# Patient Record
Sex: Female | Born: 1950 | Race: White | Hispanic: No | Marital: Married | Smoking: Never smoker
Health system: Southern US, Community
[De-identification: ages and names within clinical notes are randomized; demographics above are authoritative.]

## PROBLEM LIST (undated history)

## (undated) DIAGNOSIS — M1990 Unspecified osteoarthritis, unspecified site: Secondary | ICD-10-CM

## (undated) DIAGNOSIS — K219 Gastro-esophageal reflux disease without esophagitis: Secondary | ICD-10-CM

## (undated) DIAGNOSIS — E785 Hyperlipidemia, unspecified: Secondary | ICD-10-CM

## (undated) DIAGNOSIS — H269 Unspecified cataract: Secondary | ICD-10-CM

## (undated) DIAGNOSIS — E079 Disorder of thyroid, unspecified: Secondary | ICD-10-CM

## (undated) DIAGNOSIS — E039 Hypothyroidism, unspecified: Secondary | ICD-10-CM

## (undated) DIAGNOSIS — T8859XA Other complications of anesthesia, initial encounter: Secondary | ICD-10-CM

## (undated) DIAGNOSIS — N189 Chronic kidney disease, unspecified: Secondary | ICD-10-CM

## (undated) DIAGNOSIS — H409 Unspecified glaucoma: Secondary | ICD-10-CM

## (undated) DIAGNOSIS — T4145XA Adverse effect of unspecified anesthetic, initial encounter: Secondary | ICD-10-CM

## (undated) HISTORY — DX: Hyperlipidemia, unspecified: E78.5

## (undated) HISTORY — PX: OOPHORECTOMY: SHX86

## (undated) HISTORY — DX: Disorder of thyroid, unspecified: E07.9

## (undated) HISTORY — DX: Unspecified cataract: H26.9

## (undated) HISTORY — DX: Unspecified glaucoma: H40.9

---

## 1988-04-30 HISTORY — PX: ABDOMINAL HYSTERECTOMY: SHX81

## 1995-11-27 HISTORY — PX: PUBOVAGINAL SLING: SHX1035

## 1999-11-30 ENCOUNTER — Encounter: Admission: RE | Admit: 1999-11-30 | Discharge: 1999-11-30 | Payer: Self-pay | Admitting: Obstetrics and Gynecology

## 1999-11-30 ENCOUNTER — Encounter: Payer: Self-pay | Admitting: Obstetrics and Gynecology

## 1999-12-07 ENCOUNTER — Encounter: Admission: RE | Admit: 1999-12-07 | Discharge: 1999-12-07 | Payer: Self-pay | Admitting: Obstetrics and Gynecology

## 1999-12-07 ENCOUNTER — Encounter: Payer: Self-pay | Admitting: Obstetrics and Gynecology

## 2000-06-18 HISTORY — PX: CARPAL TUNNEL RELEASE: SHX101

## 2001-04-23 ENCOUNTER — Other Ambulatory Visit: Admission: RE | Admit: 2001-04-23 | Discharge: 2001-04-23 | Payer: Self-pay | Admitting: Obstetrics and Gynecology

## 2001-05-15 ENCOUNTER — Encounter: Admission: RE | Admit: 2001-05-15 | Discharge: 2001-05-15 | Payer: Self-pay | Admitting: Obstetrics and Gynecology

## 2001-05-15 ENCOUNTER — Encounter: Payer: Self-pay | Admitting: Obstetrics and Gynecology

## 2005-05-28 ENCOUNTER — Encounter: Admission: RE | Admit: 2005-05-28 | Discharge: 2005-05-28 | Payer: Self-pay | Admitting: Obstetrics and Gynecology

## 2005-06-22 ENCOUNTER — Ambulatory Visit: Payer: Self-pay | Admitting: Internal Medicine

## 2005-08-03 ENCOUNTER — Ambulatory Visit: Payer: Self-pay | Admitting: Internal Medicine

## 2005-08-29 ENCOUNTER — Ambulatory Visit: Payer: Self-pay | Admitting: Internal Medicine

## 2005-09-12 ENCOUNTER — Ambulatory Visit (HOSPITAL_COMMUNITY): Admission: RE | Admit: 2005-09-12 | Discharge: 2005-09-12 | Payer: Self-pay | Admitting: Internal Medicine

## 2005-09-12 ENCOUNTER — Ambulatory Visit: Payer: Self-pay | Admitting: Internal Medicine

## 2006-02-06 ENCOUNTER — Ambulatory Visit: Payer: Self-pay | Admitting: Internal Medicine

## 2006-08-05 ENCOUNTER — Ambulatory Visit (HOSPITAL_COMMUNITY): Admission: RE | Admit: 2006-08-05 | Discharge: 2006-08-05 | Payer: Self-pay | Admitting: Obstetrics & Gynecology

## 2006-09-13 ENCOUNTER — Ambulatory Visit: Payer: Self-pay | Admitting: Internal Medicine

## 2006-09-19 ENCOUNTER — Ambulatory Visit: Payer: Self-pay | Admitting: Internal Medicine

## 2006-10-14 ENCOUNTER — Ambulatory Visit: Payer: Self-pay | Admitting: Internal Medicine

## 2006-10-14 LAB — CONVERTED CEMR LAB
ALT: 25 units/L (ref 0–40)
AST: 24 units/L (ref 0–37)
Albumin: 3.8 g/dL (ref 3.5–5.2)
BUN: 16 mg/dL (ref 6–23)
Creatinine, Ser: 0.7 mg/dL (ref 0.4–1.2)
GFR calc non Af Amer: 93 mL/min
Glomerular Filtration Rate, Af Am: 112 mL/min/{1.73_m2}
HCT: 41.4 % (ref 36.0–46.0)
MCHC: 34.2 g/dL (ref 30.0–36.0)
Neutrophils Relative %: 57.1 % (ref 43.0–77.0)
Platelets: 348 10*3/uL (ref 150–400)
Potassium: 4.5 meq/L (ref 3.5–5.1)
RBC: 4.66 M/uL (ref 3.87–5.11)
RDW: 12.4 % (ref 11.5–14.6)
Sodium: 138 meq/L (ref 135–145)
Total Bilirubin: 0.6 mg/dL (ref 0.3–1.2)
Total Protein: 7.3 g/dL (ref 6.0–8.3)
WBC: 6 10*3/uL (ref 4.5–10.5)

## 2006-11-06 ENCOUNTER — Ambulatory Visit: Payer: Self-pay | Admitting: Internal Medicine

## 2006-12-10 ENCOUNTER — Ambulatory Visit (HOSPITAL_BASED_OUTPATIENT_CLINIC_OR_DEPARTMENT_OTHER): Admission: RE | Admit: 2006-12-10 | Discharge: 2006-12-10 | Payer: Self-pay | Admitting: Orthopedic Surgery

## 2006-12-10 HISTORY — PX: FINGER SURGERY: SHX640

## 2006-12-16 ENCOUNTER — Ambulatory Visit: Payer: Self-pay | Admitting: Gastroenterology

## 2006-12-30 ENCOUNTER — Ambulatory Visit: Payer: Self-pay | Admitting: Gastroenterology

## 2007-01-14 ENCOUNTER — Ambulatory Visit: Payer: Self-pay | Admitting: Internal Medicine

## 2007-01-14 LAB — CONVERTED CEMR LAB
ALT: 21 units/L (ref 0–40)
AST: 20 units/L (ref 0–37)
LDL Cholesterol: 62 mg/dL (ref 0–99)
Total CHOL/HDL Ratio: 2.6
VLDL: 14 mg/dL (ref 0–40)

## 2007-01-27 ENCOUNTER — Ambulatory Visit: Payer: Self-pay | Admitting: Internal Medicine

## 2007-02-10 ENCOUNTER — Ambulatory Visit: Payer: Self-pay | Admitting: Internal Medicine

## 2007-04-25 ENCOUNTER — Encounter (INDEPENDENT_AMBULATORY_CARE_PROVIDER_SITE_OTHER): Payer: Self-pay | Admitting: *Deleted

## 2007-04-29 ENCOUNTER — Ambulatory Visit: Payer: Self-pay | Admitting: Internal Medicine

## 2007-04-29 DIAGNOSIS — M79609 Pain in unspecified limb: Secondary | ICD-10-CM

## 2007-04-29 DIAGNOSIS — R079 Chest pain, unspecified: Secondary | ICD-10-CM

## 2007-06-09 ENCOUNTER — Encounter: Payer: Self-pay | Admitting: Internal Medicine

## 2007-07-03 ENCOUNTER — Ambulatory Visit: Payer: Self-pay | Admitting: Internal Medicine

## 2007-07-08 LAB — CONVERTED CEMR LAB
ALT: 29 units/L (ref 0–35)
Albumin: 3.8 g/dL (ref 3.5–5.2)
Alkaline Phosphatase: 124 units/L — ABNORMAL HIGH (ref 39–117)
Cholesterol: 113 mg/dL (ref 0–200)
Total Bilirubin: 0.7 mg/dL (ref 0.3–1.2)
Total Protein: 7.1 g/dL (ref 6.0–8.3)

## 2007-07-09 ENCOUNTER — Encounter (INDEPENDENT_AMBULATORY_CARE_PROVIDER_SITE_OTHER): Payer: Self-pay | Admitting: *Deleted

## 2007-08-22 ENCOUNTER — Ambulatory Visit (HOSPITAL_COMMUNITY): Admission: RE | Admit: 2007-08-22 | Discharge: 2007-08-22 | Payer: Self-pay | Admitting: Obstetrics and Gynecology

## 2007-09-26 ENCOUNTER — Encounter: Payer: Self-pay | Admitting: Family Medicine

## 2007-09-26 ENCOUNTER — Ambulatory Visit: Payer: Self-pay | Admitting: Family Medicine

## 2007-09-26 ENCOUNTER — Encounter (INDEPENDENT_AMBULATORY_CARE_PROVIDER_SITE_OTHER): Payer: Self-pay | Admitting: Family Medicine

## 2008-03-24 ENCOUNTER — Telehealth (INDEPENDENT_AMBULATORY_CARE_PROVIDER_SITE_OTHER): Payer: Self-pay | Admitting: *Deleted

## 2008-05-20 ENCOUNTER — Encounter: Payer: Self-pay | Admitting: Internal Medicine

## 2008-06-01 ENCOUNTER — Ambulatory Visit: Payer: Self-pay | Admitting: Internal Medicine

## 2008-08-24 ENCOUNTER — Telehealth (INDEPENDENT_AMBULATORY_CARE_PROVIDER_SITE_OTHER): Payer: Self-pay | Admitting: *Deleted

## 2008-08-27 ENCOUNTER — Ambulatory Visit: Payer: Self-pay | Admitting: Internal Medicine

## 2008-09-12 LAB — CONVERTED CEMR LAB
Albumin: 3.9 g/dL (ref 3.5–5.2)
Alkaline Phosphatase: 116 units/L (ref 39–117)
Bilirubin, Direct: 0.1 mg/dL (ref 0.0–0.3)
Cholesterol: 133 mg/dL (ref 0–200)
LDL Cholesterol: 74 mg/dL (ref 0–99)
Total CHOL/HDL Ratio: 3.4
Total Protein: 7.4 g/dL (ref 6.0–8.3)
VLDL: 20 mg/dL (ref 0–40)

## 2008-09-13 ENCOUNTER — Encounter (INDEPENDENT_AMBULATORY_CARE_PROVIDER_SITE_OTHER): Payer: Self-pay | Admitting: *Deleted

## 2008-10-26 LAB — HM COLONOSCOPY

## 2008-11-25 ENCOUNTER — Ambulatory Visit: Payer: Self-pay | Admitting: Family Medicine

## 2008-12-02 ENCOUNTER — Ambulatory Visit: Payer: Self-pay | Admitting: Internal Medicine

## 2008-12-11 ENCOUNTER — Encounter (INDEPENDENT_AMBULATORY_CARE_PROVIDER_SITE_OTHER): Payer: Self-pay | Admitting: *Deleted

## 2008-12-11 LAB — CONVERTED CEMR LAB
Hgb A1c MFr Bld: 6.1 % — ABNORMAL HIGH (ref 4.6–6.0)
TSH: 4.25 microintl units/mL (ref 0.35–5.50)

## 2008-12-17 ENCOUNTER — Telehealth (INDEPENDENT_AMBULATORY_CARE_PROVIDER_SITE_OTHER): Payer: Self-pay | Admitting: *Deleted

## 2008-12-18 ENCOUNTER — Ambulatory Visit: Payer: Self-pay | Admitting: Family Medicine

## 2009-06-07 ENCOUNTER — Ambulatory Visit: Payer: Self-pay | Admitting: Internal Medicine

## 2009-06-07 DIAGNOSIS — K219 Gastro-esophageal reflux disease without esophagitis: Secondary | ICD-10-CM

## 2009-06-07 DIAGNOSIS — E785 Hyperlipidemia, unspecified: Secondary | ICD-10-CM

## 2009-06-07 DIAGNOSIS — E039 Hypothyroidism, unspecified: Secondary | ICD-10-CM

## 2009-06-08 ENCOUNTER — Encounter: Payer: Self-pay | Admitting: Internal Medicine

## 2009-07-25 ENCOUNTER — Telehealth (INDEPENDENT_AMBULATORY_CARE_PROVIDER_SITE_OTHER): Payer: Self-pay | Admitting: *Deleted

## 2009-09-02 ENCOUNTER — Ambulatory Visit: Payer: Self-pay | Admitting: Internal Medicine

## 2009-09-08 ENCOUNTER — Ambulatory Visit: Payer: Self-pay | Admitting: Internal Medicine

## 2009-09-09 ENCOUNTER — Telehealth (INDEPENDENT_AMBULATORY_CARE_PROVIDER_SITE_OTHER): Payer: Self-pay | Admitting: *Deleted

## 2009-09-09 ENCOUNTER — Encounter (INDEPENDENT_AMBULATORY_CARE_PROVIDER_SITE_OTHER): Payer: Self-pay | Admitting: *Deleted

## 2009-09-09 LAB — CONVERTED CEMR LAB
ALT: 30 units/L (ref 0–35)
AST: 27 units/L (ref 0–37)
Bilirubin, Direct: 0 mg/dL (ref 0.0–0.3)
Cholesterol: 196 mg/dL (ref 0–200)
HDL: 36.9 mg/dL — ABNORMAL LOW (ref >39.00)
Hgb A1c MFr Bld: 6.1 % (ref 4.6–6.5)
Total Bilirubin: 1 mg/dL (ref 0.3–1.2)
Total Protein: 7.5 g/dL (ref 6.0–8.3)
VLDL: 22.6 mg/dL (ref 0.0–40.0)

## 2009-09-15 ENCOUNTER — Telehealth (INDEPENDENT_AMBULATORY_CARE_PROVIDER_SITE_OTHER): Payer: Self-pay | Admitting: *Deleted

## 2009-09-15 ENCOUNTER — Ambulatory Visit: Payer: Self-pay | Admitting: Family Medicine

## 2009-09-15 ENCOUNTER — Encounter: Payer: Self-pay | Admitting: Internal Medicine

## 2009-09-20 ENCOUNTER — Encounter (INDEPENDENT_AMBULATORY_CARE_PROVIDER_SITE_OTHER): Payer: Self-pay | Admitting: *Deleted

## 2009-09-20 DIAGNOSIS — J45909 Unspecified asthma, uncomplicated: Secondary | ICD-10-CM

## 2009-09-27 ENCOUNTER — Ambulatory Visit: Payer: Self-pay | Admitting: Internal Medicine

## 2009-10-14 ENCOUNTER — Encounter: Admission: RE | Admit: 2009-10-14 | Discharge: 2009-10-14 | Payer: Self-pay | Admitting: Obstetrics and Gynecology

## 2009-11-16 ENCOUNTER — Telehealth (INDEPENDENT_AMBULATORY_CARE_PROVIDER_SITE_OTHER): Payer: Self-pay | Admitting: *Deleted

## 2009-11-18 ENCOUNTER — Encounter: Payer: Self-pay | Admitting: Internal Medicine

## 2010-01-02 ENCOUNTER — Ambulatory Visit: Payer: Self-pay | Admitting: Internal Medicine

## 2010-01-02 DIAGNOSIS — M26629 Arthralgia of temporomandibular joint, unspecified side: Secondary | ICD-10-CM

## 2010-01-02 LAB — CONVERTED CEMR LAB: Rapid Strep: NEGATIVE

## 2010-01-17 ENCOUNTER — Telehealth: Payer: Self-pay | Admitting: Internal Medicine

## 2010-03-30 ENCOUNTER — Encounter (INDEPENDENT_AMBULATORY_CARE_PROVIDER_SITE_OTHER): Payer: Self-pay | Admitting: *Deleted

## 2010-04-28 ENCOUNTER — Telehealth (INDEPENDENT_AMBULATORY_CARE_PROVIDER_SITE_OTHER): Payer: Self-pay | Admitting: *Deleted

## 2010-05-01 ENCOUNTER — Ambulatory Visit: Payer: Self-pay | Admitting: Internal Medicine

## 2010-05-01 ENCOUNTER — Telehealth (INDEPENDENT_AMBULATORY_CARE_PROVIDER_SITE_OTHER): Payer: Self-pay | Admitting: *Deleted

## 2010-05-09 LAB — CONVERTED CEMR LAB
ALT: 26 units/L (ref 0–35)
AST: 27 units/L (ref 0–37)
Albumin: 4.1 g/dL (ref 3.5–5.2)
Chloride: 105 meq/L (ref 96–112)
Eosinophils Relative: 1.9 % (ref 0.0–5.0)
GFR calc non Af Amer: 97.77 mL/min (ref >60)
Glucose, Bld: 89 mg/dL (ref 70–99)
HDL: 42.4 mg/dL (ref >39.00)
Lymphocytes Relative: 30.1 % (ref 12.0–46.0)
MCV: 89.2 fL (ref 78.0–100.0)
Monocytes Absolute: 0.7 10*3/uL (ref 0.1–1.0)
Monocytes Relative: 10.4 % (ref 3.0–12.0)
Neutrophils Relative %: 57 % (ref 43.0–77.0)
Platelets: 293 10*3/uL (ref 150.0–400.0)
Potassium: 4.4 meq/L (ref 3.5–5.1)
RBC: 4.49 M/uL (ref 3.87–5.11)
Sodium: 142 meq/L (ref 135–145)
Total Protein: 7.1 g/dL (ref 6.0–8.3)
Triglycerides: 71 mg/dL (ref 0.0–149.0)
VLDL: 14.2 mg/dL (ref 0.0–40.0)
WBC: 6.3 10*3/uL (ref 4.5–10.5)

## 2010-05-12 ENCOUNTER — Telehealth (INDEPENDENT_AMBULATORY_CARE_PROVIDER_SITE_OTHER): Payer: Self-pay | Admitting: *Deleted

## 2010-05-30 ENCOUNTER — Ambulatory Visit: Payer: Self-pay | Admitting: Internal Medicine

## 2010-05-30 DIAGNOSIS — IMO0001 Reserved for inherently not codable concepts without codable children: Secondary | ICD-10-CM | POA: Insufficient documentation

## 2010-05-30 DIAGNOSIS — E559 Vitamin D deficiency, unspecified: Secondary | ICD-10-CM | POA: Insufficient documentation

## 2010-05-31 ENCOUNTER — Encounter: Payer: Self-pay | Admitting: Internal Medicine

## 2010-05-31 LAB — CONVERTED CEMR LAB
Magnesium: 2.5 mg/dL (ref 1.5–2.5)
Vit D, 25-Hydroxy: 41 ng/mL (ref 30–89)

## 2010-06-27 ENCOUNTER — Telehealth: Payer: Self-pay | Admitting: Internal Medicine

## 2010-08-23 ENCOUNTER — Ambulatory Visit: Payer: Self-pay | Admitting: Internal Medicine

## 2010-11-30 ENCOUNTER — Telehealth: Payer: Self-pay | Admitting: Internal Medicine

## 2010-12-06 ENCOUNTER — Other Ambulatory Visit: Payer: Self-pay | Admitting: Internal Medicine

## 2010-12-06 ENCOUNTER — Ambulatory Visit
Admission: RE | Admit: 2010-12-06 | Discharge: 2010-12-06 | Payer: Self-pay | Source: Home / Self Care | Attending: Internal Medicine | Admitting: Internal Medicine

## 2010-12-06 LAB — HEPATIC FUNCTION PANEL
ALT: 31 U/L (ref 0–35)
AST: 28 U/L (ref 0–37)
Albumin: 3.7 g/dL (ref 3.5–5.2)
Alkaline Phosphatase: 139 U/L — ABNORMAL HIGH (ref 39–117)
Bilirubin, Direct: 0.1 mg/dL (ref 0.0–0.3)
Total Bilirubin: 0.5 mg/dL (ref 0.3–1.2)
Total Protein: 7 g/dL (ref 6.0–8.3)

## 2010-12-06 LAB — LIPID PANEL
Cholesterol: 131 mg/dL (ref 0–200)
HDL: 39.8 mg/dL (ref >39.00)
LDL Cholesterol: 73 mg/dL (ref 0–99)
Total CHOL/HDL Ratio: 3
Triglycerides: 91 mg/dL (ref 0.0–149.0)
VLDL: 18.2 mg/dL (ref 0.0–40.0)

## 2010-12-06 LAB — TSH: TSH: 5.78 u[IU]/mL — ABNORMAL HIGH (ref 0.35–5.50)

## 2010-12-18 ENCOUNTER — Telehealth (INDEPENDENT_AMBULATORY_CARE_PROVIDER_SITE_OTHER): Payer: Self-pay | Admitting: *Deleted

## 2010-12-26 ENCOUNTER — Telehealth (INDEPENDENT_AMBULATORY_CARE_PROVIDER_SITE_OTHER): Payer: Self-pay | Admitting: *Deleted

## 2010-12-27 ENCOUNTER — Encounter (INDEPENDENT_AMBULATORY_CARE_PROVIDER_SITE_OTHER): Payer: Self-pay | Admitting: *Deleted

## 2010-12-28 NOTE — Progress Notes (Signed)
Summary: Refill Request  Phone Note Refill Request Message from:  Patient on May 01, 2010 10:42 AM  Refills Requested: Medication #1:  VYTORIN 10-40 MG TABS 1 by mouth [facility] bedtime [facility] [address] Nothing has reached the pharmacy, please send now! It has been over a week.   Initial call taken by: Harold Barban,  May 01, 2010 10:43 AM  Follow-up for Phone Call        Called RX in to the pharmacy Follow-up by: Shonna Chock,  May 01, 2010 11:21 AM

## 2010-12-28 NOTE — Progress Notes (Signed)
Summary: Triage: Exposed to Scabies  Phone Note Call from Patient Call back at [phone]   Summary of Call: Message left on Triage VM: Patient was exposed to scabies, ? what to do or what to look for   Dr.Hopper please advise./Chrae [facility] CMA  June 27, 2010 4:19 PM   Follow-up for Phone Call        watch for itching or  linear reddish burrows on extremities, in armpits or groin areas Follow-up by: Marga Melnick MD,  June 27, 2010 5:46 PM  Additional Follow-up for Phone Call Additional follow up Details #1::        Patient notified. Additional Follow-up by: Lucious Groves CMA,  June 28, 2010 1:58 PM

## 2010-12-28 NOTE — Progress Notes (Signed)
Summary: Medication Concerns  Phone Note Refill Request   [facility] to know "which one is [facility]" levothyroid 25 micrograms 1 tab by mouth every day  or levoxyl 25 micrograms 1 tab every day -- [facility] [phone]  Initial call taken by: Okey Regal [place],  May 12, 2010 9:08 AM  Follow-up for Phone Call        Left message on machine for patient to return call when avaliable, Reason for call:   discuss thyroid med Follow-up by: Shonna Chock,  May 12, 2010 9:48 AM  Additional Follow-up for Phone Call Additional follow up Details #1::        Spoke with patient, patient states her med bottle say's Levoxyl. I changed on med list and forwarded back to the pharmacy Additional Follow-up by: Shonna Chock,  May 12, 2010 3:48 PM    New/Updated Medications: LEVOXYL 25 MCG TABS (LEVOTHYROXINE SODIUM) 1 by mouth once daily Prescriptions: LEVOXYL 25 MCG TABS (LEVOTHYROXINE SODIUM) 1 by mouth once daily  #30 x 11   Entered by:   Shonna Chock   Authorized by:   Marga Melnick MD   Signed by:   Shonna Chock on 05/12/2010   Method used:   [facility]        [facility]  [address] [id number]       [address]       [place], [place]  [address]       Ph: [phone] or [phone]       Fax: [phone]   RxID:   [id number]

## 2010-12-28 NOTE — Progress Notes (Signed)
Summary: still no better  Phone Note Call from Patient Call back at Home Phone [phone]   Caller: Patient Summary of Call: pt left VM that she was seen on 01-02-09 and rx a z-pak for bronchitis. pt still c/o runny nose, and cough with yellowish wihite mucous. Pt denies any sore throat, fever, or chest congestion. [facility] [address] pls advise...................Marland KitchenFelecia Deloach CMA  January 17, 2010 11:47 AM   Follow-up for Phone Call        Metronidazole 500 mg three times a day #21(no alcohol on this) Follow-up by: Marga Melnick MD,  January 17, 2010 12:11 PM  Additional Follow-up for Phone Call Additional follow up Details #1::        pt aware rx sent to pharmacy..............Marland KitchenFelecia Deloach CMA  January 17, 2010 12:24 PM     New/Updated Medications: FLAGYL 500 MG TABS (METRONIDAZOLE) Take 1 three times a day (no alcohol on this) Prescriptions: FLAGYL 500 MG TABS (METRONIDAZOLE) Take 1 three times a day (no alcohol on this)  #21 x 0   Entered by:   Jeremy Johann CMA   Authorized by:   Marga Melnick MD   Signed by:   Jeremy Johann CMA on 01/17/2010   Method used:   [facility] to ...       [facility]  [address] [id number]       [address]       [place], [place]  [address]       Ph: [phone] or [phone]       Fax: [phone]   RxID:   [id number]

## 2010-12-28 NOTE — Progress Notes (Signed)
Summary: med not available  Phone Note From Pharmacy   Summary of Call: FLAGYL 500 MG TABS  is not available pls advise on alternate med............Marland KitchenFelecia Deloach CMA  January 17, 2010 2:36 PM   Follow-up for Phone Call        per dr hopper DOXYCYCLINE  100 MG Take 1 by mouth two times a day x2days then 1 tab once daily  #12.  pt aware rx sent to pharmacy............Marland KitchenFelecia Deloach CMA  January 17, 2010 2:44 PM     New/Updated Medications: DOXYCYCLINE HYCLATE 100 MG TABS (DOXYCYCLINE HYCLATE) Take 1 by mouth two times a day x2days then 1 tab once daily Prescriptions: DOXYCYCLINE HYCLATE 100 MG TABS (DOXYCYCLINE HYCLATE) Take 1 by mouth two times a day x2days then 1 tab once daily  #12 x 0   Entered by:   Jeremy Johann CMA   Authorized by:   Marga Melnick MD   Signed by:   Jeremy Johann CMA on 01/17/2010   Method used:   [facility]ph[facility] to ...       [facility]  [address] [id number]       [address]       [place], [place]  [address]       Ph: [phone] or [phone]       Fax: [phone]   RxID:   [id number]

## 2010-12-28 NOTE — Progress Notes (Signed)
Summary: Lab Work  Phone Note Call from Patient   Caller: Patient Summary of Call: Patient called and said it was time for blood work. A letter was also mailed to her reminding her it was time for blood work. What labs does she need and codes? Initial call taken by: Harold Barban,  April 28, 2010 9:55 AM  Follow-up for Phone Call        If patient is going to schedule a CPX we can do all labs(Lipid/Hep/CBCD/BMP/TSH V70.0/995.20/272.4) if not we can draw Lipid/Hep 272.4/995.20 Follow-up by: Shonna Chock,  April 28, 2010 10:40 AM

## 2010-12-28 NOTE — Assessment & Plan Note (Signed)
Summary: SINUS PRESSURE,FACIAL PAIN/RH......   Vital Signs:  Patient profile:   60 year old female Weight:      212.8 pounds BMI:     37.24 Temp:     98.7 degrees F oral Pulse rate:   84 / minute Resp:     15 per minute BP sitting:   130 / 84  (left arm) Cuff size:   large  Vitals Entered By: Shonna Chock CMA (August 23, 2010 12:09 PM) CC: Sinus concerns since yesterday, URI symptoms   CC:  Sinus concerns since yesterday and URI symptoms.  History of Present Illness: RTI Symptoms      This is a 60 year old woman who presents with RTI symptoms > 24 hrs.Trigger was smoke exposure 09/26.  The patient reports nasal congestion and productive cough with green sputum, but denies purulent nasal discharge, sore throat,  and earache.  The patient denies fever.  The patient also reports frontal headache.  Risk factors for Strep sinusitis include bilateral facial pain and tooth pain.  The patient denies the following risk factors for Strep sinusitis: tender adenopathy.  Rx: Neti pot, Dayquil &  Nyquil  Current Medications (verified): 1)  Premarin 0.425 Mg/gm Crea (Estrogens, Conjugated) .Marland Kitchen.. 1 By Mouth Once Daily 2)  Advair Diskus 250-50 Mcg/dose Misc (Fluticasone-Salmeterol) .Marland Kitchen.. 1puff Bid 3)  Levoxyl 25 Mcg Tabs (Levothyroxine Sodium) .Marland Kitchen.. 1 By Mouth Once Daily 4)  Nexium 40 Mg Cpdr (Esomeprazole Magnesium) .Marland Kitchen.. 1 Two Times A Day X 2 Weeks Then 1 Once Daily 5)  Ventolin Hfa 108 (90 Base) Mcg/act Aers (Albuterol Sulfate) .... 2 Puffs By Mouth Every 4 Hours As Needed For Asthma 6)  Ergocalciferol 50000 Unit Caps (Ergocalciferol) .... Take 1 Tablet Once A Week. 7)  Zinc 50 Mg Tabs (Zinc) .... Take 1 Tablet By Mouth Once A Day 8)  Simvastatin 40 Mg Tabs (Simvastatin) .Marland Kitchen.. 1 At Bedtime 9)  Calcium-Magnesium 500-250 Mg Tabs (Calcium-Magnesium) .Marland Kitchen.. 1 By Mouth Once Daily  Allergies: 1)  ! Cipro 2)  ! Pcn 3)  ! Ceftin 4)  ! Sulfa 5)  ! * Tobramycin 6)  ! * Ciloxan  Physical  Exam  General:  in no acute distress; alert,appropriate and cooperative throughout examination Ears:  External ear exam shows no significant lesions or deformities.  Otoscopic examination reveals clear canals, tympanic membranes are intact bilaterally without bulging, retraction, inflammation or discharge. Hearing is grossly normal bilaterally. Nose:  External nasal examination shows no deformity or inflammation. Nasal mucosa are  dry  without lesions or exudates. Mouth:  Oral mucosa and oropharynx without lesions or exudates.  Teeth in good repair. No pharyngeal erythema.   Lungs:  Normal respiratory effort, chest expands symmetrically. Lungs are clear to auscultation, no crackles or wheezes but dry cough. Heart:  Normal rate and regular rhythm. S1 and S2 normal without gallop, murmur, click, rub .S 4 Cervical Nodes:  No lymphadenopathy noted Axillary Nodes:  No palpable lymphadenopathy   Impression & Recommendations:  Problem # 1:  URI (ICD-465.9)  Her updated medication list for this problem includes:    Hydromet 5-1.5 Mg/64ml Syrp (Hydrocodone-homatropine) .Marland Kitchen... 1 tsp every 6 hrs as needed  Problem # 2:  BRONCHITIS-ACUTE (ICD-466.0)  The following medications were removed from the medication list:    Doxycycline Hyclate 100 Mg Tabs (Doxycycline hyclate) .Marland Kitchen... Take 1 by mouth two times a day x2days then 1 tab once daily Her updated medication list for this problem includes:    Advair Diskus 250-50  Mcg/dose Misc (Fluticasone-salmeterol) .Marland Kitchen... 1puff bid    Ventolin Hfa 108 (90 Base) Mcg/act Aers (Albuterol sulfate) .Marland Kitchen... 2 puffs by mouth every 4 hours as needed for asthma    Hydromet 5-1.5 Mg/73ml Syrp (Hydrocodone-homatropine) .Marland Kitchen... 1 tsp every 6 hrs as needed    Azithromycin 250 Mg Tabs (Azithromycin) .Marland Kitchen... As per pack  Complete Medication List: 1)  Premarin 0.425 Mg/gm Crea (estrogens, Conjugated)  .Marland Kitchen.. 1 by mouth once daily 2)  Advair Diskus 250-50 Mcg/dose Misc  (Fluticasone-salmeterol) .Marland Kitchen.. 1puff bid 3)  Levoxyl 25 Mcg Tabs (Levothyroxine sodium) .Marland Kitchen.. 1 by mouth once daily 4)  Nexium 40 Mg Cpdr (Esomeprazole magnesium) .Marland Kitchen.. 1 two times a day x 2 weeks then 1 once daily 5)  Ventolin Hfa 108 (90 Base) Mcg/act Aers (Albuterol sulfate) .... 2 puffs by mouth every 4 hours as needed for asthma 6)  Ergocalciferol 50000 Unit Caps (Ergocalciferol) .... Take 1 tablet once a week. 7)  Zinc 50 Mg Tabs (Zinc) .... Take 1 tablet by mouth once a day 8)  Simvastatin 40 Mg Tabs (Simvastatin) .Marland Kitchen.. 1 at bedtime 9)  Calcium-magnesium 500-250 Mg Tabs (Calcium-magnesium) .Marland Kitchen.. 1 by mouth once daily 10)  Hydromet 5-1.5 Mg/31ml Syrp (Hydrocodone-homatropine) .Marland Kitchen.. 1 tsp every 6 hrs as needed 11)  Azithromycin 250 Mg Tabs (Azithromycin) .... As per pack  Patient Instructions: 1)  Drink as much NON dairy  fluid as you can tolerate for the next few days. Prescriptions: AZITHROMYCIN 250 MG TABS (AZITHROMYCIN) as per pack  #1 x 0   Entered and Authorized by:   Marga Melnick MD   Signed by:   Marga Melnick MD on 08/23/2010   Method used:   Printed then [facility] to ...       [facility]  [address] [id number]       [address]       [place], [place]  [address]       Ph: [phone] or [phone]       Fax: [phone]   RxID:   [id number] HYDROMET 5-1.5 MG/5ML SYRP (HYDROCODONE-HOMATROPINE) 1 tsp every 6 hrs as needed  #90cc x 0   Entered and Authorized by:   Marga Melnick MD   Signed by:   Marga Melnick MD on 08/23/2010   Method used:   Printed then faxed to ...       [facility]  [address] [id number]       [address]       [place], [place]  [address]       Ph: [phone] or [phone]       Fax: [phone]   RxID:   [id number]

## 2010-12-28 NOTE — Assessment & Plan Note (Signed)
Summary: SINUS INFECTION,STREP THROAT/KDC   Vital Signs:  Patient profile:   60 year old female Weight:      213 pounds O2 Sat:      97 % on Room air Temp:     98.5 degrees F oral Pulse rate:   87 / minute Resp:     14 per minute BP sitting:   142 / 90  (left arm)  Vitals Entered By: Jeremy Johann CMA (January 02, 2010 12:52 PM)  O2 Flow:  Room air CC: cough, sore throat, green mucous Comments REVIEWED MED LIST, PATIENT AGREED DOSE AND INSTRUCTION CORRECT    CC:  cough, sore throat, and green mucous.  History of Present Illness: Onset as R frontall headache as of 12/28/2009. Dry , irritated throat as of 02/06. R> L earache w/o discharge. Rx: Mucinex DM  Allergies: 1)  ! Cipro 2)  ! Pcn 3)  ! Ceftin 4)  ! Sulfa 5)  ! * Tobramycin 6)  ! * Ciloxan  Review of Systems General:  Denies chills, fever, and sweats. ENT:  Denies nasal congestion and sinus pressure; No facial pain ; scant nasal D/C. Pain L ear since 09/2009; chronic pain @ Dental appts. Resp:  Complains of cough and sputum productive; denies shortness of breath and wheezing; Scant yellow sputum, > from chest.  Physical Exam  General:  well-nourished,in no acute distress; alert,appropriate and cooperative throughout examination Ears:  External ear exam shows no significant lesions or deformities.  Otoscopic examination reveals clear canals, tympanic membranes are intact bilaterally without bulging, retraction, inflammation or discharge. Hearing is grossly normal bilaterally. Nose:  External nasal examination shows no deformity or inflammation. Nasal mucosa are pink and moist without lesions or exudates. Mouth:  Oral mucosa and oropharynx without lesions or exudates.  Teeth in good repair. Classic L TMJ pain with mouth opening Lungs:  Normal respiratory effort, chest expands symmetrically. Lungs are clear to auscultation, no crackles or wheezes. Cervical Nodes:  No lymphadenopathy noted Axillary Nodes:  No  palpable lymphadenopathy   Impression & Recommendations:  Problem # 1:  BRONCHITIS-ACUTE (ICD-466.0)  The following medications were removed from the medication list:    Clarithromycin 500 Mg Xr24h-tab (Clarithromycin) .Marland Kitchen... 2 once daily with a meal ; stop vytorin until completed Her updated medication list for this problem includes:    Advair Diskus 250-50 Mcg/dose Misc (Fluticasone-salmeterol) .Marland Kitchen... 1puff bid    Azithromycin 250 Mg Tabs (Azithromycin) .Marland Kitchen... 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 1 by mouth once daily 2)  Advair Diskus 250-50 Mcg/dose Misc (Fluticasone-salmeterol) .Marland Kitchen.. 1puff bid 3)  Levothroid 25 Mcg Tabs (Levothyroxine sodium) .Marland Kitchen.. 1 by mouth qd 4)  Vytorin 10-40 Mg Tabs (Ezetimibe-simvastatin) .Marland Kitchen.. 1 by mouth at bedtime 5)  Nexium 40 Mg Cpdr (Esomeprazole magnesium) .Marland Kitchen.. 1 two times a day x 2 weeks then 1 once daily 6)  Azithromycin 250 Mg Tabs (Azithromycin) .... As per pack  Patient Instructions: 1)  Warm compresses or heating pad two times a day prmn for TMJ. Go to WebMD for TMJ . Glucosamine 1500 mg once daily , 3 months on ,then 2 months off. 2)  Drink as much fluid as you can tolerate for the next few days. Neti pot once daily for head congestion Prescriptions: AZITHROMYCIN 250 MG TABS (AZITHROMYCIN) as per pack  #1 x 0   Entered  and Authorized by:   Marga Melnick MD   Signed by:   Marga Melnick MD on 01/02/2010   Method used:   [facility]ed to ...       [facility]  [address] [id number]       [address]       [place], [place]  [address]       Ph: [phone] or [phone]       Fax: [phone]   RxID:   [id number]   Laboratory Results   Date/Time Reported: January 02, 2010 1:11 PM   Other Tests  Rapid Strep: negative

## 2010-12-28 NOTE — Letter (Signed)
Summary: Primary Care Appointment Letter  [facility]  [address] [place], [place] [address]   Phone: [phone]  Fax: [phone]    03/30/2010 MRN: [id number]  [facility] [address] [place], [place]  [address]  Dear [facility],   Your Primary Care Physician Marga Melnick MD has indicated that:    _______it is time to schedule an appointment.    _______you missed your appointment on______ and need to call and          reschedule.    ___X____you need to have lab work done.    _______you need to schedule an appointment discuss lab or test results.    _______you need to call to reschedule your appointment that is                       scheduled on _________.     Please call our office as soon as possible. Our phone number is [phone]. Please press option 1. Our office is open 8a-12noon and 1p-5p, Monday through Friday.     Thank you,    [facility]

## 2010-12-28 NOTE — Assessment & Plan Note (Signed)
Summary: F/U on labs/scm--Rm 5   Vital Signs:  Patient profile:   60 year old female Height:      63.5 inches Weight:      215.50 pounds BMI:     37.71 Temp:     98.0 degrees F oral Pulse rate:   78 / minute Pulse rhythm:   regular Resp:     16 per minute BP sitting:   130 / 90  (left arm) Cuff size:   large  Vitals Entered By: Mervin Kung CMA Duncan Dull) (May 30, 2010 9:11 AM) CC: Room 5  Follow up of labs.  Pt states her hair loss has seemed to stop after starting the Vitamin D. Needs refills on Levoxyl and Vytorin. Is Patient Diabetic? No Comments Pt has completed Doxycycline.   CC:  Room 5  Follow up of labs.  Pt states her hair loss has seemed to stop after starting the Vitamin D. Needs refills on Levoxyl and Vytorin.Marland Kitchen  History of Present Illness:  Hyperlipidemia Follow-Up      This is a 60 year old woman who presents for Hyperlipidemia follow-up.  The patient reports muscle aches intermittently , occasional constipation, and "diarrhea"  as loose stool in am, but denies GI upset, abdominal pain, flushing, itching, and fatigue.  Other symptoms include pedal edema in LLE, especially across dorsum of foot.  The patient denies the following symptoms: exertional chest pain/pressure, exercise intolerance, dypsnea, palpitations, and syncope.  Compliance with medications (by patient report) has been near 100%.  Dietary compliance has been good.  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  Labs reviewed & risks discussed; copy given to patient.  Allergies: 1)  ! Cipro 2)  ! Pcn 3)  ! Ceftin 4)  ! Sulfa 5)  ! * Tobramycin 6)  ! * Ciloxan  Review of Systems General:  Complains of fatigue; denies weight loss. Eyes:  Complains of blurring; denies double vision and vision loss-both eyes. ENT:  Denies difficulty swallowing and hoarseness. CV:  Denies  palpitations. GI:  Denies bloody stools and dark tarry stools; ERD controlled with PPI. MS:  Complains of muscle aches and cramps; Leg cramps every night & positionally during day. On vit D 50,000 International Units weekly from Dr Genice Rouge. Derm:  Complains of changes in nail beds, dryness, and hair loss; Hair loss improved with increased vitamin D from Gyn. Neuro:  Denies numbness, tingling, and weakness. Endo:  Denies cold intolerance and heat intolerance.  Physical Exam  General:  well-nourished,in no acute distress; alert,appropriate and cooperative throughout examination Eyes:  No corneal or conjunctival inflammation noted. Nolid lag  Neck:  No deformities, masses, or tenderness noted. Lungs:  Normal respiratory effort, chest expands symmetrically. Lungs are clear to auscultation, no crackles or wheezes. Heart:  Normal rate and regular rhythm. S1 and S2 normal without gallop, murmur, click, rub.S4 Abdomen:  Bowel sounds positive,abdomen soft and non-tender without masses, organomegaly or hernias noted.Slight dullness LLQ Pulses:  R and L carotid,radia,dorsalis pedis and posterior tibial pulses are full and equal bilaterally Extremities:  No clubbing, cyanosis, or deformity noted with normal full range of motion of all joints.  No nail changes   Neurologic:  alert & oriented X3 and DTRs symmetrical and normal.   Skin:  Intact without suspicious lesions or rashes Cervical Nodes:  No lymphadenopathy noted Axillary Nodes:  No  palpable lymphadenopathy Psych:  memory intact for recent and remote, normally interactive, and good eye contact.     Impression & Recommendations:  Problem # 1:  HYPERLIPIDEMIA (ICD-272.4)  The following medications were removed from the medication list:    Vytorin 10-40 Mg Tabs (Ezetimibe-simvastatin) .Marland Kitchen... 1 by mouth at bedtime Her updated medication list for this problem includes:    Simvastatin 40 Mg Tabs (Simvastatin) .Marland Kitchen... 1 at bedtime  Problem # 2:   GERD (ICD-530.81) stable Her updated medication list for this problem includes:    Nexium 40 Mg Cpdr (Esomeprazole magnesium) .Marland Kitchen... 1 two times a day x 2 weeks then 1 once daily  Problem # 3:  UNSPECIFIED MYALGIA AND MYOSITIS (ICD-729.1)  Orders: Venipuncture (56387) TLB-CK Total Only(Creatine Kinase/CPK) (82550-CK) TLB-Magnesium (Mg) (83735-MG) T-Vitamin D (25-Hydroxy) (56433-29518)  Problem # 4:  VITAMIN D DEFICIENCY (ICD-268.9)  Orders: Venipuncture (84166) T-Vitamin D (25-Hydroxy) (06301-60109)  Problem # 5:  HYPOTHYROIDISM (ICD-244.9) TSH therapeutic Her updated medication list for this problem includes:    Levoxyl 25 Mcg Tabs (Levothyroxine sodium) .Marland Kitchen... 1 by mouth once daily  Complete Medication List: 1)  Premarin 0.425 Mg/gm Crea (estrogens, Conjugated)  .Marland Kitchen.. 1 by mouth once daily 2)  Advair Diskus 250-50 Mcg/dose Misc (Fluticasone-salmeterol) .Marland Kitchen.. 1puff bid 3)  Levoxyl 25 Mcg Tabs (Levothyroxine sodium) .Marland Kitchen.. 1 by mouth once daily 4)  Nexium 40 Mg Cpdr (Esomeprazole magnesium) .Marland Kitchen.. 1 two times a day x 2 weeks then 1 once daily 5)  Doxycycline Hyclate 100 Mg Tabs (Doxycycline hyclate) .... Take 1 by mouth two times a day x2days then 1 tab once daily 6)  Ventolin Hfa 108 (90 Base) Mcg/act Aers (Albuterol sulfate) .... 2 puffs by mouth every 4 hours as needed for asthma 7)  Ergocalciferol 50000 Unit Caps (Ergocalciferol) .... Take 1 tablet once a week. 8)  Zinc 50 Mg Tabs (Zinc) .... Take 1 tablet by mouth once a day 9)  Simvastatin 40 Mg Tabs (Simvastatin) .Marland Kitchen.. 1 at bedtime  Patient Instructions: 1)  Please schedule a follow-up appointment in 6 months. 2)  Hepatic Panel prior to visit, ICD-9:995.20 3)  Lipid Panel prior to visit, ICD-9:272.5 4)  TSH prior to visit, ICD-9:244.9 5)  Avoid foods high in acid (tomatoes, citrus juices, spicy foods). Avoid eating within two hours of lying down or before exercising. Do not over eat; try smaller more frequent meals. Elevate head  of bed twelve inches when sleeping. Prescriptions: VENTOLIN HFA 108 (90 BASE) MCG/ACT AERS (ALBUTEROL SULFATE) 2 puffs by mouth every 4 hours as needed for asthma  #1 x 2   Entered and Authorized by:   Marga Melnick MD   Signed by:   Marga Melnick MD on 05/30/2010   Method used:   Print then Give to Patient   RxID:   [id number] LEVOXYL 25 MCG TABS (LEVOTHYROXINE SODIUM) 1 by mouth once daily  #90 x 3   Entered and Authorized by:   Marga Melnick MD   Signed by:   Marga Melnick MD on 05/30/2010   Method used:   Print then Give to Patient   RxID:   [id number] SIMVASTATIN 40 MG TABS (SIMVASTATIN) 1 at bedtime  #90 x 1   Entered and Authorized by:   Marga Melnick MD   Signed by:   Marga Melnick MD on 05/30/2010   Method used:   Print then Give to Patient   RxID:   [id number]   Prevention & Chronic Care Immunizations   Influenza vaccine: Fluvax 3+  (  09/15/2009)    Tetanus booster: Not documented    Pneumococcal vaccine: Pneumovax  (09/27/2009)  Colorectal Screening   Hemoccult: Not documented    Colonoscopy: Not documented  Other Screening   Pap smear: Not documented    Mammogram: Not documented   Smoking status: never  (06/01/2008)  Lipids   Total Cholesterol: 112  (05/01/2010)   LDL: 55  (05/01/2010)   LDL Direct: Not documented   HDL: 42.40  (05/01/2010)   Triglycerides: 71.0  (05/01/2010)    SGOT (AST): 27  (05/01/2010)   SGPT (ALT): 26  (05/01/2010)   Alkaline phosphatase: 148  (05/01/2010)   Total bilirubin: 0.7  (05/01/2010)  Self-Management Support :    Lipid self-management support: Not documented     Current Allergies (reviewed today): ! CIPRO ! PCN ! CEFTIN ! SULFA ! * TOBRAMYCIN ! * CILOXAN

## 2010-12-28 NOTE — Progress Notes (Signed)
Summary: Nexium not preferred  Phone Note Other Incoming   Summary of Call: The office received fax from [facility] [address] stating that the insurance company prefers generic prevacid, protonix, or prilosec instead of Nexium.  Per MD generic protonix should be given. Initial call taken by: Lucious Groves CMA,  November 30, 2010 4:50 PM    New/Updated Medications: PANTOPRAZOLE SODIUM 40 MG TBEC (PANTOPRAZOLE SODIUM) 1 by mouth qd Prescriptions: PANTOPRAZOLE SODIUM 40 MG TBEC (PANTOPRAZOLE SODIUM) 1 by mouth qd  #30 x 3   Entered by:   Lucious Groves CMA   Authorized by:   Marga Melnick MD   Signed by:   Lucious Groves CMA on 11/30/2010   Method used:   [facility]ically [facility]        [facility]  [address] [id number]       [address]       [place], [place]  [address]       Ph: [phone] or [phone]       Fax: [phone]   RxID:   [id number]

## 2011-01-03 NOTE — Progress Notes (Signed)
Summary: lmom  --needs followup visit  1/23--mailed letter  2/1  ---- Converted from flag ---- ---- 12/17/2010 8:12 AM, Marga Melnick MD wrote: please verify F/U appt beforerefilling  med . Lipids are great but thyroid needs adjusting. Bring meds to appt ------------------------------       Additional Follow-up for Phone Call Additional follow up Details #2::    lmom (home - [phone]) to call and make a followup appointment --advised her to bring meds bottles--labs are good, but thyroiod needs adjusting.Marland KitchenMarland KitchenMarland KitchenJerolyn Obrien  December 18, 2010 1:15 PM  mailed letter.Holly Obrien  December 27, 2010 12:15 PM

## 2011-01-03 NOTE — Progress Notes (Signed)
Summary: Refill Request  Phone Note Refill Request Call back at [phone] Message from:  Pharmacy on December 26, 2010 8:18 AM  Refills Requested: Medication #1:  SIMVASTATIN 40 MG TABS 1 at bedtime**LABS DUE NOW**   Dosage confirmed as above?Dosage Confirmed   Supply Requested: 30   [facility] [address]   Next Appointment Scheduled: none Initial call taken by: Harold Barban,  December 26, 2010 8:18 AM    New/Updated Medications: SIMVASTATIN 40 MG TABS (SIMVASTATIN) 1 at bedtime Prescriptions: SIMVASTATIN 40 MG TABS (SIMVASTATIN) 1 at bedtime  #90 x 3   Entered by:   Shonna Chock CMA   Authorized by:   Marga Melnick MD   Signed by:   Shonna Chock CMA on 12/26/2010   Method used:   [facility]        [facility]  [address] [id number]       [address]       [place], [place]  [address]       Ph: [phone] or [phone]       Fax: [phone]   RxID:   [id number]

## 2011-01-03 NOTE — Letter (Signed)
Summary: Primary Care Appointment Letter  [facility]  [address] [place], [place] [address]   Phone: [phone]  Fax: [phone]    12/27/2010 MRN: [id number]  [facility] [address] [place], [place]  [address]  Dear Ms. [facility],   Your Primary Care Physician Marga Melnick MD has indicated that:    _XXXXXX  It is time to schedule an appointment for a followup.    Please call to schedule before refilling your meds.  Dr Alwyn Ren says your lipids are great, but your thyroid needs adjusting.  Please bring your medications to your next appointment.    _______you missed your appointment on______ and need to call and          reschedule.    _______you need to have lab work done.    _______you need to schedule an appointment discuss lab or test results.    _______you need to call to reschedule your appointment that is                       scheduled on _________.     Please call our office as soon as possible. Our phone number is [phone].   Our office is open 8a-12noon and 1p-5p, Monday through Friday.     Thank you,       Katie Primary Care Scheduler

## 2011-01-15 ENCOUNTER — Other Ambulatory Visit: Payer: Self-pay | Admitting: Obstetrics and Gynecology

## 2011-01-15 DIAGNOSIS — Z1231 Encounter for screening mammogram for malignant neoplasm of breast: Secondary | ICD-10-CM

## 2011-02-02 ENCOUNTER — Ambulatory Visit
Admission: RE | Admit: 2011-02-02 | Discharge: 2011-02-02 | Disposition: A | Discharge status: Home / Self Care | Payer: Federal, State, Local not specified - PPO | Source: Ambulatory Visit | Attending: Obstetrics and Gynecology | Admitting: Obstetrics and Gynecology

## 2011-02-02 DIAGNOSIS — Z1231 Encounter for screening mammogram for malignant neoplasm of breast: Secondary | ICD-10-CM

## 2011-02-07 ENCOUNTER — Other Ambulatory Visit: Payer: Self-pay | Admitting: Internal Medicine

## 2011-02-07 ENCOUNTER — Encounter: Payer: Self-pay | Admitting: Internal Medicine

## 2011-02-07 ENCOUNTER — Ambulatory Visit (INDEPENDENT_AMBULATORY_CARE_PROVIDER_SITE_OTHER): Payer: Federal, State, Local not specified - PPO | Admitting: Internal Medicine

## 2011-02-07 ENCOUNTER — Other Ambulatory Visit: Payer: Self-pay | Admitting: Obstetrics and Gynecology

## 2011-02-07 DIAGNOSIS — R5381 Other malaise: Secondary | ICD-10-CM

## 2011-02-07 DIAGNOSIS — E039 Hypothyroidism, unspecified: Secondary | ICD-10-CM

## 2011-02-07 DIAGNOSIS — E559 Vitamin D deficiency, unspecified: Secondary | ICD-10-CM

## 2011-02-07 DIAGNOSIS — E785 Hyperlipidemia, unspecified: Secondary | ICD-10-CM

## 2011-02-07 DIAGNOSIS — R5383 Other fatigue: Secondary | ICD-10-CM | POA: Insufficient documentation

## 2011-02-07 DIAGNOSIS — R928 Other abnormal and inconclusive findings on diagnostic imaging of breast: Secondary | ICD-10-CM

## 2011-02-07 LAB — CBC WITH DIFFERENTIAL/PLATELET
Basophils Relative: 0.5 % (ref 0.0–3.0)
Eosinophils Relative: 2.2 % (ref 0.0–5.0)
Hemoglobin: 13.6 g/dL (ref 12.0–15.0)
Lymphocytes Relative: 31.6 % (ref 12.0–46.0)
MCV: 89.8 fl (ref 78.0–100.0)
Neutrophils Relative %: 56.7 % (ref 43.0–77.0)
RBC: 4.45 Mil/uL (ref 3.87–5.11)
WBC: 5.7 10*3/uL (ref 4.5–10.5)

## 2011-02-07 LAB — BASIC METABOLIC PANEL
Calcium: 9.4 mg/dL (ref 8.4–10.5)
Chloride: 103 mEq/L (ref 96–112)
Creatinine, Ser: 0.8 mg/dL (ref 0.4–1.2)
Sodium: 139 mEq/L (ref 135–145)

## 2011-02-07 LAB — CONVERTED CEMR LAB
HDL goal, serum: 40 mg/dL
LDL Goal: 130 mg/dL

## 2011-02-13 ENCOUNTER — Ambulatory Visit
Admission: RE | Admit: 2011-02-13 | Discharge: 2011-02-13 | Disposition: A | Discharge status: Home / Self Care | Payer: Federal, State, Local not specified - PPO | Source: Ambulatory Visit | Attending: Obstetrics and Gynecology | Admitting: Obstetrics and Gynecology

## 2011-02-13 DIAGNOSIS — R928 Other abnormal and inconclusive findings on diagnostic imaging of breast: Secondary | ICD-10-CM

## 2011-02-13 NOTE — Assessment & Plan Note (Signed)
Summary: discuss thyroid from labwork, ///sph   Vital Signs:  Patient profile:   60 year old female Weight:      216.2 pounds BMI:     37.83 Pulse rate:   88 / minute Resp:     14 per minute BP sitting:   134 / 88  (left arm) Cuff size:   large  Vitals Entered By: Shonna Chock CMA (February 07, 2011 8:15 AM) CC: Follow-up visit: discuss thyroid, patient is fasting if any additional labs needed , Fatigue, Lipid Management   CC:  Follow-up visit: discuss thyroid, patient is fasting if any additional labs needed , Fatigue, and Lipid Management.  History of Present Illness:    She describes fatigue X 2-3 months; TSH 5.78 on  12/06/2010 with 25 micrograms Levoxyl once daily . She reports persistent fatigue and fatigue  even without physical exertion.  The patient denies fever, night sweats, weight loss, exertional chest pain, dyspnea, and cough.  Other symptoms include   hair & skin changes, dryness .  The patient denies the following symptoms: leg swelling, orthopnea, PND, melena, adenopathy, severe snoring, and daytime sleepiness.  Depressive symptoms include poor sleep but no apnea, dry.  The patient denies altered appetite.       Lipids reviewed : LDL 73 on 40 mg of Simvastatin. NMR Lipoprofile LDL goal = < 80. Strong FH premature CAD/MI. She  denies muscle aches, GI upset, abdominal pain, flushing, itching, constipation, and diarrhea.  Compliance with medications (by patient report) has been near 100%.  Dietary compliance has been good.  The patient reports exercising 3 X per week.    Lipid Management History:      Positive NCEP/ATP III risk factors include female age 6 years old or older, HDL cholesterol less than 40, and family history for ischemic heart disease (males less than 21 years old).  Negative NCEP/ATP III risk factors include no history of early menopause without estrogen hormone replacement, non-diabetic, non-tobacco-user status, non-hypertensive, no ASHD (atherosclerotic heart  disease), no prior stroke/TIA, no peripheral vascular disease, and no history of aortic aneurysm.     Current Medications (verified): 1)  Premarin 0.45 Mg Tabs (Estrogens Conjugated) .Marland Kitchen.. 1 By Mouth Once Daily 2)  Advair Diskus 250-50 Mcg/dose Misc (Fluticasone-Salmeterol) .Marland Kitchen.. 1puff Bid 3)  Levoxyl 25 Mcg Tabs (Levothyroxine Sodium) .Marland Kitchen.. 1 By Mouth Once Daily 4)  Ventolin Hfa 108 (90 Base) Mcg/act Aers (Albuterol Sulfate) .... 2 Puffs By Mouth Every 4 Hours As Needed For Asthma 5)  Ergocalciferol 50000 Unit Caps (Ergocalciferol) .... Take 1 Tablet Once A Week. 6)  Simvastatin 40 Mg Tabs (Simvastatin) .Marland Kitchen.. 1 At Bedtime 7)  Calcium-Magnesium 500-250 Mg Tabs (Calcium-Magnesium) .Marland Kitchen.. 1 By Mouth Once Daily 8)  Pantoprazole Sodium 40 Mg Tbec (Pantoprazole Sodium) .Marland Kitchen.. 1 By Mouth Qd  Allergies: 1)  ! Cipro 2)  ! Pcn 3)  ! Ceftin 4)  ! Sulfa 5)  ! * Tobramycin 6)  ! * Ciloxan  Past History:  Past Medical History: Asthma with URIs HH / ERD, esophagitis Hyperlipidemia: NMR Lipoprofile2007: LDL 132 (2443/1706), HDL 51, TG 113. LDL goal = < 80.[place] Study LDL goal = < 130. Father MI @ 14; bro MI in 81s. Hypothyroidism  Review of Systems Eyes:  Complains of blurring; denies double vision and vision loss-both eyes; Neg Ophth exam last week. ENT:  Denies difficulty swallowing and hoarseness. CV:  Denies palpitations. Neuro:  Denies numbness and tingling. Endo:  Denies cold intolerance and heat intolerance.  Physical  Exam  General:  well-nourished;alert,appropriate and cooperative throughout examination Eyes:  No corneal or conjunctival inflammation noted. EOMI. No lid lag Neck:  No deformities, masses, or tenderness noted. Lungs:  Normal respiratory effort, chest expands symmetrically. Lungs are clear to auscultation, no crackles or wheezes. Heart:  Normal rate and regular rhythm. S1 and S2 normal without gallop, murmur, click, rub.S4 Abdomen:  Bowel sounds positive,abdomen soft and  non-tender without masses, organomegaly . Ventral hernia  noted.Dullness LUQ Pulses:  R and L carotid,radial,dorsalis pedis and posterior tibial pulses are full and equal bilaterally Extremities:  No clubbing, cyanosis, edema. No onycholysis Neurologic:  alert & oriented X3 and DTRs symmetrical and normal.  No tremor Skin:  Intact without suspicious lesions or rashes Cervical Nodes:  No lymphadenopathy noted Axillary Nodes:  No palpable lymphadenopathy Psych:  memory intact for recent and remote, normally interactive, and good eye contact.     Impression & Recommendations:  Problem # 1:  FATIGUE (ICD-780.79)  Orders: Venipuncture (91478) TLB-BMP (Basic Metabolic Panel-BMET) (80048-METABOL) TLB-CBC Platelet - w/Differential (85025-CBCD)  Problem # 2:  HYPOTHYROIDISM (ICD-244.9)  suboptimal correction Her updated medication list for this problem includes:    Levoxyl 25 Mcg Tabs (Levothyroxine sodium) .Marland Kitchen... 1 by mouth once daily except 1&1/2 t & th  Orders: Venipuncture (29562)  Problem # 3:  HYPERLIPIDEMIA (ICD-272.4)  lipids are @ goal  Her updated medication list for this problem includes:    Simvastatin 40 Mg Tabs (Simvastatin) .Marland Kitchen... 1 at bedtime  Orders: Venipuncture (13086) TLB-BMP (Basic Metabolic Panel-BMET) (80048-METABOL)  Problem # 4:  MYOCARDIAL INFARCTION, PREMATURE, FAMILY HX (ICD-V17.3)  Problem # 5:  VITAMIN D DEFICIENCY (ICD-268.9)  Orders: T-Vitamin D (25-Hydroxy) (57846-96295)  Complete Medication List: 1)  Premarin 0.45 Mg Tabs (Estrogens conjugated) .Marland Kitchen.. 1 by mouth once daily 2)  Advair Diskus 250-50 Mcg/dose Misc (Fluticasone-salmeterol) .Marland Kitchen.. 1puff bid 3)  Levoxyl 25 Mcg Tabs (Levothyroxine sodium) .Marland Kitchen.. 1 by mouth once daily except 1&1/2 t & th 4)  Ventolin Hfa 108 (90 Base) Mcg/act Aers (Albuterol sulfate) .... 2 puffs by mouth every 4 hours as needed for asthma 5)  Ergocalciferol 50000 Unit Caps (Ergocalciferol) .... Take 1 tablet once a  week. 6)  Simvastatin 40 Mg Tabs (Simvastatin) .Marland Kitchen.. 1 at bedtime 7)  Calcium-magnesium 500-250 Mg Tabs (Calcium-magnesium) .Marland Kitchen.. 1 by mouth once daily 8)  Pantoprazole Sodium 40 Mg Tbec (Pantoprazole sodium) .Marland Kitchen.. 1 by mouth qd  Lipid Assessment/Plan:      Based on NCEP/ATP III, the patient's risk factor category is "2 or more risk factors and a calculated 10 year CAD risk of < 20%".  The patient's lipid goals are as follows: Total cholesterol goal is 200; LDL cholesterol goal is 130; HDL cholesterol goal is 40; Triglyceride goal is 150.    Patient Instructions: 1)  Please schedule a follow-up appointment in 4 months. 2)  [facility] Lipid Panel prior to visit, ICD-9:272.4, V17.3 3)  TSH prior to visit, ICD-9:244.9 Prescriptions: LEVOXYL 25 MCG TABS (LEVOTHYROXINE SODIUM) 1 by mouth once daily EXCEPT 1&1/2 T & Th  #90 x 3   Entered and Authorized by:   Marga Melnick MD   Signed by:   Marga Melnick MD on 02/07/2011   Method used:   [facility] ...       [facility]  [address] [id number]       [address]       [place], [place]  [address]  Ph: [phone] or [phone]       Fax: [phone]   RxID:   [id number]    Orders Added: 1)  Est. Patient Level IV [44010] 2)  Venipuncture [27253] 3)  TLB-BMP (Basic Metabolic Panel-BMET) [80048-METABOL] 4)  TLB-CBC Platelet - w/Differential [85025-CBCD] 5)  T-Vitamin D (25-Hydroxy) [66440-34742]  Appended Document: discuss thyroid from labwork, ///sph

## 2011-03-09 ENCOUNTER — Telehealth: Payer: Self-pay | Admitting: Internal Medicine

## 2011-03-09 NOTE — Telephone Encounter (Signed)
Patient says she has checked with pharmacy about her Generic Nexium prescription and they say they are waiting on prior authorization (call [phone])  ---she has BCBS---[facility] [address] [place]

## 2011-03-13 NOTE — Telephone Encounter (Signed)
Contacted insurance copy medication has been approved from 01/12/11-03/12/12 waiting for approval information to be faxed to pharmacy

## 2011-04-09 ENCOUNTER — Other Ambulatory Visit: Payer: Self-pay | Admitting: *Deleted

## 2011-04-09 MED ORDER — PANTOPRAZOLE SODIUM 40 MG PO TBEC: 40.0000 mg | DELAYED_RELEASE_TABLET | Freq: Every day | ORAL | Status: DC

## 2011-04-11 ENCOUNTER — Other Ambulatory Visit: Payer: Self-pay | Admitting: Internal Medicine

## 2011-04-11 MED ORDER — ESOMEPRAZOLE MAGNESIUM 40 MG PO CPDR: DELAYED_RELEASE_CAPSULE | ORAL | Status: DC

## 2011-04-13 NOTE — Op Note (Signed)
Holly Obrien, Holly Obrien             ACCOUNT NO.:  [id number]   MEDICAL RECORD NO.:  [id number]          PATIENT TYPE:  AMB   LOCATION:  [facility]SC                          FACILITY:  [facility]   PHYSICIAN:  Katy Fitch. Sypher, M.D. 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   POSTOPERATIVE DIAGNOSIS:  Partial laceration of left index radial proper  digital nerve at proximal interphalangeal flexion crease, estimated  volume of nerve laceration 40-50% with at least two lacerated fascicles.   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   OPERATING SURGEON:  Josephine Igo, M.D.   ASSISTANT:  Holly Maduro Dasnoit PA-C.   ANESTHESIA:  General by LMA.   SUPERVISING ANESTHESIOLOGIST:  Dr. Krista Blue.   INDICATIONS:  Holly Obrien is a 60 year old employee of the [employer].  On 11/06/2006 she accidentally stabbed her left index finger  radial aspect of PIP flexion crease with scissors.  Shortly thereafter  she noted loss of sensation along the radial aspect of her index finger  pulp.   She brought this to the attention of her primary care physician who  initially advised observation.   She subsequently was seen for hand surgery consult approximately 2-1/2  weeks following injury.  She was noted have absent sensibility to two-  point discrimination, sharp and dull in the radial pulp.   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   We had a 30-minute face-to-face consultation during which I spent  approximately 20 minutes describing the potential risks and benefits of  intervention.   I asked Holly Obrien if she could live with her finger the way it was.  She was troubled by a neuroma symptoms at the site of the laceration  with electric shock sensation with direct contact and pain.  She is also  troubled by inability to feel with the radial aspect of the pulp which  is a primary surface for prehension of the thumb.   She ultimately decided that she would like to attempt repair, fully  understanding that no guarantees could be made or implied regarding our  ability to heal her nerve.   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   In full view of all these issues, she proceeds with surgery at this  time.   PROCEDURE:  Holly Obrien is brought to operating room and placed in  supine position on the operating table.   Following an anesthesia consult.  She was transferred to room six placed  in supine position on the table and general anesthesia by LMA technique  induced.   The entire left hand and arm were prepped with Betadine soap solution,  sterilely draped.  A pneumatic tourniquet was applied to the proximal  left brachium.   On exsanguination of the left arm with Esmarch bandage, the arterial  tourniquet on the proximal brachium was inflated to 240 mmHg.   The procedure commenced with a Brunner zigzag incision centered over  the  PIP flexion crease.  The subcutaneous tissues were carefully elevated  exposing the flexor sheath and the radial neurovascular bundle.   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   With the aid of an operating microscope and micro instruments, we  carefully dissected the radial proper digital artery away from the  neuroma and retracted bluntly.  The posterior and dorsal aspect of the  nerve was intact with at least two or three intact fascicles.  I could  identify at least two lacerated fascicles on the palmar surface.   With great care, an intraneural dissection was accomplished identifying  the injured fascicles with the neuromas proximally and the gliomas  distally.   A fresh 15 blade was used to wedge out the scar between the proximal and  distal fascicular bundles followed by use of 9-0 nylon to close the  triangular defect created reapproximating the injured fascicles.   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   A 1.5 mm palmar vein was harvested and dilated with micro dilators,  subsequently split with micro scissors and placed with the endothelium  against the repair and sutured with four corner sutures of 9-0 nylon.  A  4 mm segment of the nerve was encased with the vein not unlike a sausage  casing.   The goal of the nerve wrap is to try to prevent ectopic sprouting of  axons.   The repair site was then irrigated followed by skin closure.  Ms.  Obrien was placed in compressive dressing.  There were no apparent  complications.   This micro case was facilitated by the assistance of my physician assistant, Holly Obrien.  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   Microsurgery cases, particularly with a vein wrap, are not possible to  perform efficaciously without a trained surgical assistant.      Katy Fitch Sypher, M.D.  Electronically Signed     RVS/MEDQ  D:  12/10/2006  T:  12/10/2006  Job:  [id number]

## 2011-05-13 ENCOUNTER — Other Ambulatory Visit: Payer: Self-pay | Admitting: Internal Medicine

## 2011-05-14 NOTE — Telephone Encounter (Signed)
Patient Instructions: 1)  Please schedule a follow-up appointment in 4 months. 2)  [place] [facility] Lipid Panel prior to visit, ICD-9:272.4, V17.3 3)  TSH prior to visit, ICD-9:244.9 Copied from 02/07/11

## 2011-05-18 ENCOUNTER — Encounter: Payer: Self-pay | Admitting: Family

## 2011-05-18 ENCOUNTER — Encounter: Payer: Self-pay | Admitting: Internal Medicine

## 2011-05-18 ENCOUNTER — Ambulatory Visit (INDEPENDENT_AMBULATORY_CARE_PROVIDER_SITE_OTHER): Payer: Federal, State, Local not specified - PPO | Admitting: Family

## 2011-05-18 DIAGNOSIS — J329 Chronic sinusitis, unspecified: Secondary | ICD-10-CM | POA: Insufficient documentation

## 2011-05-18 MED ORDER — CLARITHROMYCIN 500 MG PO TABS: 500.0000 mg | ORAL_TABLET | Freq: Two times a day (BID) | ORAL | Status: AC

## 2011-05-18 NOTE — Patient Instructions (Signed)

## 2011-05-18 NOTE — Progress Notes (Signed)
  Subjective:    Patient ID: Holly Obrien, female    DOB: 26-Aug-1951, 60 y.o.   MRN: [id number]  HPI    Review of Systems    see HPI Objective:   Physical Exam  Constitutional: She appears well-developed and well-nourished.  HENT:  Head: Normocephalic.  Right Ear: Tympanic membrane normal.  Left Ear: Tympanic membrane normal.       No significant sinus tenderness to palpation.  Eyes: Conjunctivae are normal. Pupils are equal, round, and reactive to light.  Neck: Neck supple.  Cardiovascular: Normal rate and regular rhythm.   Pulmonary/Chest: Effort normal and breath sounds normal.  Lymphadenopathy:    She has no cervical adenopathy.          Assessment & Plan:  Sinus Pain Patient complains of nasal congestion and tooth pain. Onset of symptoms was 1 day ago. Symptoms have been gradually worsening since that time. She is drinking moderate amounts of fluids.  Past history is significant for asthma. Patient is non-smoker.  Tried Dayquil sinus which helped some.  Also took a nyquil last night.  Pain is worst o nthe left cheek.  Notes some left ear pain this AM.

## 2011-05-18 NOTE — Assessment & Plan Note (Signed)
Will treat with clarithromycin x 10 days. Pt is instructed to contact us if she develops fever >101, if symptoms worsen or if they are not improved in 48-72 hours. She verbalizes understanding.

## 2011-05-25 ENCOUNTER — Encounter: Payer: Self-pay | Admitting: Family

## 2011-06-13 ENCOUNTER — Other Ambulatory Visit: Payer: Self-pay | Admitting: Internal Medicine

## 2011-06-13 DIAGNOSIS — E039 Hypothyroidism, unspecified: Secondary | ICD-10-CM

## 2011-06-14 ENCOUNTER — Other Ambulatory Visit (INDEPENDENT_AMBULATORY_CARE_PROVIDER_SITE_OTHER): Payer: Federal, State, Local not specified - PPO

## 2011-06-14 DIAGNOSIS — E039 Hypothyroidism, unspecified: Secondary | ICD-10-CM

## 2011-06-14 NOTE — Progress Notes (Signed)
Labs only

## 2011-06-18 ENCOUNTER — Other Ambulatory Visit: Payer: Self-pay

## 2011-06-18 MED ORDER — LEVOXYL 25 MCG PO TABS: ORAL_TABLET | ORAL | Status: DC

## 2011-06-18 NOTE — Telephone Encounter (Signed)
Patient aware rx sent to pharmacy.  

## 2011-07-05 ENCOUNTER — Encounter: Payer: Self-pay | Admitting: Internal Medicine

## 2011-07-19 ENCOUNTER — Ambulatory Visit (INDEPENDENT_AMBULATORY_CARE_PROVIDER_SITE_OTHER): Payer: Federal, State, Local not specified - PPO | Admitting: Internal Medicine

## 2011-07-19 ENCOUNTER — Encounter: Payer: Self-pay | Admitting: Internal Medicine

## 2011-07-19 DIAGNOSIS — E785 Hyperlipidemia, unspecified: Secondary | ICD-10-CM

## 2011-07-19 DIAGNOSIS — E8881 Metabolic syndrome: Secondary | ICD-10-CM

## 2011-07-19 DIAGNOSIS — E039 Hypothyroidism, unspecified: Secondary | ICD-10-CM

## 2011-07-19 DIAGNOSIS — Z8249 Family history of ischemic heart disease and other diseases of the circulatory system: Secondary | ICD-10-CM

## 2011-07-19 MED ORDER — METFORMIN HCL ER 500 MG PO TB24: 500.0000 mg | ORAL_TABLET | Freq: Every day | ORAL | Status: DC

## 2011-07-19 NOTE — Patient Instructions (Signed)
Eat a low-fat diet with lots of fruits and vegetables, up to 7-9 servings per day. Avoid obesity; your goal is waist measurement < 35 inches.Consume less than 40 grams of sugar per day from foods & drinks with High Fructose Corn Sugar as #1,2,3 or # 4 on label. Follow the low carb nutrition program in [employer] as closely as possible to prevent Diabetes progression & complications. White carbohydrates (potatoes, rice, bread, and pasta) have a high spike of sugar and a high load of sugar. For example a  baked potato has a cup of sugar and a  french fry  2 teaspoons of sugar. Yams, wild  rice, whole grained bread &  wheat pasta have been much lower spike and load of  sugar. Portions should be the size of a deck of cards or your palm.  Preventive Health Care: Exercise  30-45  minutes a day, 3-4 days a week. Walking is especially valuable in preventing Osteoporosis. Please  schedule fasting Labs in 4 mos : Lipids, A1c, TSH (277.7, 244.9).

## 2011-07-19 NOTE — Progress Notes (Signed)
  Subjective:    Patient ID: Holly Obrien, female    DOB: Jan 13, 1951, 60 y.o.   MRN: [id number]  HPI Dyslipidemia assessment: Prior Advanced Lipid Testing: NMR 2007: LDL goal =< 100.   Family history of premature CAD/ MI: father & bro .  Nutrition: no specific plan .  Exercise: no . Diabetes : A1c 6%  05/2011 . HTN: no. Smoking history  : never .   Weight :  Up 3#.  Lab results reviewed :not hyperabsorber; TSH 2.21; insulin level 64;  A1c 6%.     Review of Systems  ROS: fatigue: due to baby sitting grandchild  ; chest pain : no ;claudication: no; palpitations: no; abd pain/bowel changes: no ; myalgias:no;  syncope : no ; memory loss: no;skin changes: no.     Objective:   Physical Exam  Gen.: well-nourished; in no acute distress Eyes: Extraocular motion intact; no lid lag or proptosis Neck:  No deformities, thyromegaly, masses, or tenderness noted.    Heart: Normal rhythm and rate without significant murmur, gallop, or extra heart sounds Lungs: Chest clear to auscultation without rales,rales, wheezes Neuro:Deep tendon reflexes are equal and within normal limits; no tremor  Skin: Warm and dry without significant lesions or rashes; no onycholysis  All pulses intact without  bruits .No ischemic skin changes.   Psych: Normally communicative and interactive; focused & intelligent     Assessment & Plan:  #1 dyslipidemia; LDL goal is less than 100 ideally less than 70.  #2 elevated circulating insulin but high-normal A1c, prediabetes  #3 hypothyroidism, TSH therapeutic  #4 strong family history of premature coronary disease/MI  Plan: See orders and recommendations.

## 2011-07-24 ENCOUNTER — Ambulatory Visit (INDEPENDENT_AMBULATORY_CARE_PROVIDER_SITE_OTHER): Payer: Federal, State, Local not specified - PPO | Admitting: Family Medicine

## 2011-07-24 DIAGNOSIS — J329 Chronic sinusitis, unspecified: Secondary | ICD-10-CM | POA: Insufficient documentation

## 2011-07-24 MED ORDER — CLARITHROMYCIN ER 500 MG PO TB24: 1000.0000 mg | ORAL_TABLET | Freq: Every day | ORAL | Status: AC

## 2011-07-24 MED ORDER — GUAIFENESIN-CODEINE 100-10 MG/5ML PO SYRP: 10.0000 mL | ORAL_SOLUTION | Freq: Three times a day (TID) | ORAL | Status: AC | PRN

## 2011-07-24 MED ORDER — BENZONATATE 200 MG PO CAPS: 200.0000 mg | ORAL_CAPSULE | Freq: Three times a day (TID) | ORAL | Status: AC | PRN

## 2011-07-24 NOTE — Progress Notes (Signed)
  Subjective:    Patient ID: Holly Obrien, female    DOB: Sep 22, 1951, 60 y.o.   MRN: [id number]  HPI Cough- reports have facial pain/pressure.  sxs started last night- 'it hit me like a truck- at 7:00'.  + sore throat, no ear pain.  + tooth pain.  No fevers.  No known sick contacts.  Is allergic to smoke and went to cook out on Saturday night- 'this happens everytime i'm around smoke'.   Review of Systems For ROS see HPI     Objective:   Physical Exam  Constitutional: She appears well-developed and well-nourished. No distress.  HENT:  Head: Normocephalic and atraumatic.  Right Ear: Tympanic membrane normal.  Left Ear: Tympanic membrane normal.  Nose: Mucosal edema and rhinorrhea present. Right sinus exhibits maxillary sinus tenderness. Right sinus exhibits no frontal sinus tenderness. Left sinus exhibits maxillary sinus tenderness. Left sinus exhibits no frontal sinus tenderness.  Mouth/Throat: Uvula is midline and mucous membranes are normal. Posterior oropharyngeal erythema present. No oropharyngeal exudate.  Eyes: Conjunctivae and EOM are normal. Pupils are equal, round, and reactive to light.  Neck: Normal range of motion. Neck supple.  Cardiovascular: Normal rate, regular rhythm and normal heart sounds.   Pulmonary/Chest: Effort normal and breath sounds normal. No respiratory distress. She has no wheezes.  Lymphadenopathy:    She has no cervical adenopathy.          Assessment & Plan:

## 2011-07-24 NOTE — Assessment & Plan Note (Signed)
Start biaxin as pt has tolerated this previously.  Reviewed supportive care and red flags that should prompt return.  Pt expressed understanding and is in agreement w/ plan.

## 2011-07-24 NOTE — Patient Instructions (Signed)
This appears to be a sinus infection Start the Biaxin as directed- take w/ food to avoid upset stomach HOLD your cholesterol medicine while taking your antibiotic Use the cough pills for daytime, the syrup for nights and weekends Call with any questions or concerns Hang in there!!!

## 2011-08-16 ENCOUNTER — Telehealth: Payer: Self-pay

## 2011-08-16 NOTE — Telephone Encounter (Signed)
Pharmacy Error: Pharmacist called to inform Dr.Hopper that patient was given Metformin Immediate release instead of 24 hour. The pharmacy will correct this and give patient patient 24 hour, pharmacy will inform patient.  FYI for MD

## 2011-12-04 ENCOUNTER — Other Ambulatory Visit: Payer: Self-pay | Admitting: Internal Medicine

## 2011-12-04 DIAGNOSIS — E039 Hypothyroidism, unspecified: Secondary | ICD-10-CM

## 2011-12-04 MED ORDER — LEVOXYL 25 MCG PO TABS: ORAL_TABLET | ORAL | Status: DC

## 2011-12-04 NOTE — Telephone Encounter (Signed)
Patient needs TSH 244.9

## 2011-12-05 ENCOUNTER — Other Ambulatory Visit (INDEPENDENT_AMBULATORY_CARE_PROVIDER_SITE_OTHER): Payer: Federal, State, Local not specified - PPO

## 2011-12-05 DIAGNOSIS — E039 Hypothyroidism, unspecified: Secondary | ICD-10-CM

## 2011-12-27 ENCOUNTER — Telehealth: Payer: Self-pay | Admitting: Internal Medicine

## 2011-12-27 NOTE — Telephone Encounter (Signed)
Patient states that she has questions regarding her last labs.

## 2011-12-27 NOTE — Telephone Encounter (Signed)
Spoke with patient, patient called to clarify how she is to take thyroid med. Patient was taking 1 daily except 1 1/2 on T/TH. Patient told to take 1 daily except 2 on T/TH, scheduled appointment to recheck in April 2013

## 2012-01-02 ENCOUNTER — Ambulatory Visit (INDEPENDENT_AMBULATORY_CARE_PROVIDER_SITE_OTHER): Payer: Federal, State, Local not specified - PPO | Admitting: Internal Medicine

## 2012-01-02 ENCOUNTER — Encounter: Payer: Self-pay | Admitting: Internal Medicine

## 2012-01-02 VITALS — BP 132/84 | HR 76 | Temp 98.5°F | Wt 208.2 lb

## 2012-01-02 DIAGNOSIS — J320 Chronic maxillary sinusitis: Secondary | ICD-10-CM

## 2012-01-02 DIAGNOSIS — E039 Hypothyroidism, unspecified: Secondary | ICD-10-CM

## 2012-01-02 MED ORDER — FLUTICASONE PROPIONATE 50 MCG/ACT NA SUSP: 1.0000 | Freq: Two times a day (BID) | NASAL | Status: DC | PRN

## 2012-01-02 MED ORDER — CLARITHROMYCIN ER 500 MG PO TB24: 1000.0000 mg | ORAL_TABLET | Freq: Every day | ORAL | Status: AC

## 2012-01-02 NOTE — Patient Instructions (Addendum)
Hold the simvastatin until you've completed the antibiotic; it could raise the level of the statin. Plain Mucinex for thick secretions ;force NON dairy fluids. Use a Neti pot daily as needed for sinus congestion. Flonase  1 spray in L nostril twice a day as needed. Use the "crossover" technique as discussed  PLEASE BRING THESE INSTRUCTIONS TO FOLLOW UP  LAB APPOINTMENT for TSH in 10-11 weeks.This will guarantee correct labs are drawn, eliminating need for repeat blood sampling ( needle sticks ! ). Diagnoses /Codes: 244.9

## 2012-01-02 NOTE — Progress Notes (Signed)
  Subjective:    Patient ID: Holly Obrien, female    DOB: 29-Jun-1951, 61 y.o.   MRN: [id number]  HPI Respiratory tract infection Onset/symptoms:L maxillary dental pain X 1 week Exposures (illness/environmental/extrinsic):no Progression of symptoms:L maxillary pain 2/5 Treatments/response:Dentist seen 2/4; no dental process. NSAIDS w/o help Present symptoms: Fever/chills/sweats:no Frontal headache:no Facial pain:only L max Nasal purulence:no Sore throat:no Lymphadenopathy:no Wheezing/shortness of breath:no Cough/sputum/hemoptysis:no Associated extrinsic/allergic symptoms:itchy eyes/ sneezing:no          Review of Systems   She has a history of requiring root canals on 2 separate occasions after significant hormonal changes. 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  TSH was 6.18 on 12/05/11; she increased her thyroid dose to 25 mcg daily except 50 mcg on Tuesday and Thursday.     Objective:   Physical Exam General appearance:good health ;well nourished; no acute distress or increased work of breathing is present.  No  lymphadenopathy about the head, neck, or axilla noted.   Eyes: No conjunctival inflammation or lid edema is present.   Ears:  External ear exam shows no significant lesions or deformities.  Otoscopic examination reveals clear canals, tympanic membranes are intact bilaterally without bulging, retraction, inflammation or discharge.  Nose:  External nasal examination shows no deformity or inflammation. Nasal mucosa are pink and moist without lesions or exudates. No septal dislocation or deviation.No obstruction to airflow.   Oral exam: Dental hygiene is good; lips and gums are healthy appearing.There is no oropharyngeal erythema or exudate noted.   Neck:  No deformities, thyromegaly, masses, or tenderness noted.    Heart:  Normal rate and regular rhythm. S1 and S2 normal without gallop, murmur, click, rub or other extra sounds.   Lungs:Chest clear to  auscultation; no wheezes, rhonchi,rales ,or rubs present.No increased work of breathing.    Extremities:  No cyanosis, edema, or clubbing  noted    Skin: Warm & dry           Assessment & Plan:  #1 left maxillary sinusitis; problematic are the multiple intolerances or allergies listed and her statin therapy  #2 hypothyroidism; thyroid dose increased last week. Followup TSH is indicated 10-11 weeks.  Plan: See orders and recommendations

## 2012-01-21 ENCOUNTER — Other Ambulatory Visit: Payer: Self-pay | Admitting: Internal Medicine

## 2012-01-30 ENCOUNTER — Other Ambulatory Visit: Payer: Self-pay | Admitting: Internal Medicine

## 2012-01-30 NOTE — Telephone Encounter (Signed)
Prescription sent to pharmacy.

## 2012-02-06 ENCOUNTER — Ambulatory Visit (INDEPENDENT_AMBULATORY_CARE_PROVIDER_SITE_OTHER): Payer: Federal, State, Local not specified - PPO | Admitting: Internal Medicine

## 2012-02-06 ENCOUNTER — Telehealth: Payer: Self-pay | Admitting: Internal Medicine

## 2012-02-06 ENCOUNTER — Encounter: Payer: Self-pay | Admitting: Internal Medicine

## 2012-02-06 VITALS — BP 118/78 | HR 83 | Temp 97.7°F | Wt 207.0 lb

## 2012-02-06 DIAGNOSIS — M5412 Radiculopathy, cervical region: Secondary | ICD-10-CM

## 2012-02-06 MED ORDER — GABAPENTIN 100 MG PO CAPS: 100.0000 mg | ORAL_CAPSULE | Freq: Three times a day (TID) | ORAL | Status: DC

## 2012-02-06 NOTE — Telephone Encounter (Signed)
Pt being seen now

## 2012-02-06 NOTE — Telephone Encounter (Signed)
Reevaluation necessary; CT sinus films may be indicated

## 2012-02-06 NOTE — Patient Instructions (Signed)
Plain Mucinex for thick secretions ;force NON dairy fluids. Use a Neti pot daily as needed for sinus congestion. Nasal cleansing in the shower as discussed. Make sure that all residual soap is removed to prevent irritation. Nasal cleansing in the shower as discussed. Make sure that all residual soap is removed to prevent irritation. Zicam Melts or Zinc lozenges ; vitamin C 2000 mg daily; & Echinacea for 4-7 days. Report fever, exudate("pus") or progressive pain.

## 2012-02-06 NOTE — Progress Notes (Signed)
  Subjective:    Patient ID: Holly Obrien, female    DOB: 29-Jan-1951, 61 y.o.   MRN: [id number]  HPI   Onset/first symptoms: Started yesterday with headache, left sided sinus pressure, left ear and light teeth pain, roof of left side of mouth is numb Trigger/exposures: 1 month ago with similar symptoms - sinusitis - clindamycin? - helped  Progression of symptoms: "tingly" on left side of head; mom has norovirus Treatment and Response: tried mucinex, nyquil, dayquil - helped some Present symptoms:  Fever/chills/seats: last night low grade fever, no chills, no sweats, pt does state she is physically and mentally exhausted, mother in law passed away Frontal headache: no, headache radiates around nose, pain is dull Facial pain: yes Nasal purulence: clear Sore throat: no Dental pain: yes Lymphadenopathy:no Wheezing/Shortness of breath: no Cough/sputum/hemoptysis: mostly dry cough, when productive, sputum is green (normal for patient to have green sputum - pt occasionally coughs up green mucous plugs), some hemoptysis    PMH (ex: seasonal allergies, asthma, smoking etc.): has seasonal allergies and asthma however pt states asthma flares after infections and when pt is around smoke.   Review of Systems     Objective:   Physical Exam General appearance:good health ;well nourished; no acute distress or increased work of breathing is present.  No  lymphadenopathy about the head, neck, or axilla noted.   Eyes: No conjunctival inflammation or lid edema is present. EOMI  Ears:  External ear exam shows no significant lesions or deformities.  Otoscopic examination reveals clear canals, tympanic membranes are intact bilaterally without bulging, retraction, inflammation or discharge.  Nose:  External nasal examination shows no deformity or inflammation. Nasal mucosa are pink and moist without lesions or exudates. No septal dislocation or deviation.No obstruction to airflow.   Oral exam: Dental  hygiene is good; lips and gums are healthy appearing.There is no oropharyngeal erythema or exudate noted. There is dramatic crepitus with some dislocation of the temporomandibular joints with mastication maneuvers   Heart:  Normal rate and regular rhythm. S1 and S2 normal without gallop, murmur, click, rub or other extra sounds.   Lungs:Chest clear to auscultation; no wheezes, rhonchi,rales ,or rubs present.No increased work of breathing.    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 She has had viral exposures.  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 She should monitor for appearance of a rash. She should monitor for fevers as well

## 2012-02-06 NOTE — Telephone Encounter (Signed)
Patient called & stated she has finished meds from last visit & still has sinus infection. Can you send another prescription for: clarithromycin (BIAXIN XL) 500 MG 24 hr tablet [facility] [address] in [place]  Patient ph# [phone]

## 2012-02-06 NOTE — Telephone Encounter (Signed)
Pt still c/o left facial pain/pressure, teeth aching. Pt last seen on 01-02-12 for issue. Pt advise OV will be needed. Appt scheduled.

## 2012-02-14 ENCOUNTER — Other Ambulatory Visit: Payer: Self-pay | Admitting: Internal Medicine

## 2012-02-14 NOTE — Telephone Encounter (Signed)
SEE LAST LAB REPORT 

## 2012-03-18 ENCOUNTER — Other Ambulatory Visit (INDEPENDENT_AMBULATORY_CARE_PROVIDER_SITE_OTHER): Payer: Federal, State, Local not specified - PPO

## 2012-03-18 DIAGNOSIS — E8881 Metabolic syndrome: Secondary | ICD-10-CM

## 2012-03-18 DIAGNOSIS — E039 Hypothyroidism, unspecified: Secondary | ICD-10-CM

## 2012-03-18 DIAGNOSIS — R7309 Other abnormal glucose: Secondary | ICD-10-CM

## 2012-03-18 LAB — LIPID PANEL
HDL: 49.9 mg/dL (ref >39.00)
LDL Cholesterol: 63 mg/dL (ref 0–99)
Total CHOL/HDL Ratio: 3
Triglycerides: 69 mg/dL (ref 0.0–149.0)

## 2012-03-18 LAB — HEMOGLOBIN A1C: Hgb A1c MFr Bld: 5.9 % (ref 4.6–6.5)

## 2012-03-18 LAB — TSH: TSH: 5.11 u[IU]/mL (ref 0.35–5.50)

## 2012-03-18 NOTE — Progress Notes (Signed)
LABS ONLY  

## 2012-03-25 ENCOUNTER — Telehealth: Payer: Self-pay | Admitting: Internal Medicine

## 2012-03-25 MED ORDER — PANTOPRAZOLE SODIUM 40 MG PO TBEC: 40.0000 mg | DELAYED_RELEASE_TABLET | Freq: Every day | ORAL | Status: DC

## 2012-03-25 NOTE — Telephone Encounter (Signed)
Refill: Metformin hcl er 500 mg tablet.  Pantoprazole sod dr 40 mg tablet.   *Request 90 day supply*

## 2012-03-25 NOTE — Telephone Encounter (Signed)
I spoke with Holly Obrien and informed his patient was to STOP metformin per Dr.Hopper's last note on labs.

## 2012-05-03 ENCOUNTER — Other Ambulatory Visit: Payer: Self-pay | Admitting: Internal Medicine

## 2012-05-06 ENCOUNTER — Telehealth: Payer: Self-pay | Admitting: *Deleted

## 2012-05-06 NOTE — Telephone Encounter (Signed)
Pt states that pharmacy advise her that the LEVOXYL 25 MCG tablet is no longer available. Pt needs alternative for this med. .Please advise

## 2012-05-06 NOTE — Telephone Encounter (Signed)
Preferred generic thyroid on her plan or Synthroid at same dose

## 2012-05-07 MED ORDER — LEVOTHYROXINE SODIUM 25 MCG PO TABS: ORAL_TABLET | ORAL | Status: DC

## 2012-05-07 NOTE — Addendum Note (Signed)
Addended by: Regis Bill on: 05/07/2012 10:20 AM   Modules accepted: Orders

## 2012-05-07 NOTE — Telephone Encounter (Signed)
Patient Informed/SLS 

## 2012-05-07 NOTE — Telephone Encounter (Signed)
Levothyroxine [Levothroid] 25 mcg to pharmacy/

## 2012-07-06 ENCOUNTER — Other Ambulatory Visit: Payer: Self-pay | Admitting: Internal Medicine

## 2012-07-25 ENCOUNTER — Other Ambulatory Visit: Payer: Self-pay | Admitting: Internal Medicine

## 2012-07-25 MED ORDER — PANTOPRAZOLE SODIUM 40 MG PO TBEC: 40.0000 mg | DELAYED_RELEASE_TABLET | Freq: Every day | ORAL | Status: DC

## 2012-07-25 NOTE — Telephone Encounter (Signed)
Prior Auth approved 05-25-12 until 07-25-13,pharmacy faxed, Pt notified.

## 2012-07-25 NOTE — Telephone Encounter (Signed)
Refill - called in by Holly Obrien., stated she called into pharmacy last wk, they called [place] after not getting a response back and advised patient we did not get but they would resend. Holly Obrien spoke with pharmacy again today and they told her they were waiting on insurance so I will assume she means prior authorization. Please send refill if approved to   [id number] [place], [place] - [address]  Pantoprazole Sodium (Tablet Delayed Response) PROTONIX 40 MG Take 1 tablet (40 mg total) by mouth daily.  Las ov 3.13.13 acute  Also note Holly Obrien has my chart

## 2012-08-04 ENCOUNTER — Other Ambulatory Visit: Payer: Self-pay | Admitting: Internal Medicine

## 2012-08-04 NOTE — Telephone Encounter (Signed)
See comments from 02/2012 labs, labs were due 06/2012

## 2012-08-08 ENCOUNTER — Other Ambulatory Visit (INDEPENDENT_AMBULATORY_CARE_PROVIDER_SITE_OTHER): Payer: Federal, State, Local not specified - PPO

## 2012-08-08 DIAGNOSIS — R7309 Other abnormal glucose: Secondary | ICD-10-CM

## 2012-08-08 DIAGNOSIS — E039 Hypothyroidism, unspecified: Secondary | ICD-10-CM

## 2012-08-08 DIAGNOSIS — E785 Hyperlipidemia, unspecified: Secondary | ICD-10-CM

## 2012-09-07 ENCOUNTER — Other Ambulatory Visit: Payer: Self-pay | Admitting: Internal Medicine

## 2012-09-21 ENCOUNTER — Other Ambulatory Visit: Payer: Self-pay | Admitting: Internal Medicine

## 2012-10-16 ENCOUNTER — Telehealth: Payer: Self-pay | Admitting: Internal Medicine

## 2012-10-16 NOTE — Telephone Encounter (Signed)
Patient Information:  Caller Name: [place]  Phone: [phone]  Patient: Holly Obrien, Holly Obrien  Gender: Female  DOB: 15-Apr-1951  Age: 61 Years  PCP: Marga Melnick   Symptoms  Reason For Call & Symptoms: Patient states she is allergic to smoke and she when she is exposed to it she develps a sinus infection.  Exposed on Monday 10/13/12.  +cough productive clear. Occasional wheezing,   Feels similar to her asthma attack issues (#2) She has not used inhaler. Advised to do 4 puffs now  Reviewed Health History In EMR: Yes  Reviewed Medications In EMR: Yes  Reviewed Allergies In EMR: Yes  Date of Onset of Symptoms: 10/13/2012  Treatments Tried: Sinus medication OTC,  Treatments Tried Worked: No  Guideline(s) Used:  Cough  Asthma Attack  Disposition Per Guideline:   See Today in Office  Reason For Disposition Reached:   Coughing continuously (nonstop) that keeps from working or sleeping. Patient used inhaler and did respond. Ongoing coughing keeping from sleeping.   Advice Given:  Quick-Relief Asthma Medicine:   Start your quick-relief medicine (e.g., albuterol, salbutamol) at the first sign of any coughing or shortness of breath (don't wait for wheezing). Use your inhaler (2 puffs each time) or nebulizer every 4 hours. Continue the quick-relief medicine until you have not wheezed or coughed for 48 hours.  The best "cough medicine" for an adult with asthma is always the asthma medicine (Note: Don't use cough suppressants, but cough drops may help a tickly cough).  Drinking Liquids:  Try to drink normal amount of liquids (e.g., water). Being adequately hydrated makes it easier to cough up the sticky lung mucus.  Humidifier:   If the air is dry, use a cool mist humidifier to prevent drying of the upper airway.  Avoid Triggers:  Avoid known triggers of asthma attacks (e.g., tobacco smoke, cats, other pets, feather pillows, exercise).  Remove Allergens:  Take a shower to remove pollens,  animal dander, or other allergens from the body and hair.  Office Follow Up:  Does the office need to follow up with this patient?: Yes  Instructions For The Office: Patient should be seen today. Office booked an apptointment tomorrow 10/16/12  RN Note:  She is wanting to use a Cher-tussin left over from 06/2011. Advised not to use expired medication.  Patient did seem better after using inhaler . Advised home care instructions and call back parameters. Advised I would forward information to the office if available appt for work in.  Patient expressed understanding to call back for questions, concerns or changes

## 2012-10-16 NOTE — Telephone Encounter (Signed)
Patient with appointment 10/17/2012, per Dr.Hopper's protocol if patient with pending appointment ok to close encounter

## 2012-10-17 ENCOUNTER — Encounter: Payer: Self-pay | Admitting: Internal Medicine

## 2012-10-17 ENCOUNTER — Ambulatory Visit (INDEPENDENT_AMBULATORY_CARE_PROVIDER_SITE_OTHER): Payer: Federal, State, Local not specified - PPO | Admitting: Internal Medicine

## 2012-10-17 VITALS — BP 130/82 | HR 94 | Temp 98.6°F | Wt 214.8 lb

## 2012-10-17 DIAGNOSIS — J310 Chronic rhinitis: Secondary | ICD-10-CM

## 2012-10-17 DIAGNOSIS — J209 Acute bronchitis, unspecified: Secondary | ICD-10-CM

## 2012-10-17 DIAGNOSIS — J45909 Unspecified asthma, uncomplicated: Secondary | ICD-10-CM

## 2012-10-17 MED ORDER — BUDESONIDE-FORMOTEROL FUMARATE 160-4.5 MCG/ACT IN AERO: 2.0000 | INHALATION_SPRAY | Freq: Two times a day (BID) | RESPIRATORY_TRACT | Status: DC

## 2012-10-17 MED ORDER — ALBUTEROL SULFATE HFA 108 (90 BASE) MCG/ACT IN AERS: INHALATION_SPRAY | RESPIRATORY_TRACT | Status: DC

## 2012-10-17 MED ORDER — FLUTICASONE PROPIONATE 50 MCG/ACT NA SUSP: NASAL | Status: DC

## 2012-10-17 MED ORDER — AZITHROMYCIN 250 MG PO TABS: ORAL_TABLET | ORAL | Status: DC

## 2012-10-17 MED ORDER — HYDROCODONE-HOMATROPINE 5-1.5 MG/5ML PO SYRP: 5.0000 mL | ORAL_SOLUTION | Freq: Four times a day (QID) | ORAL | Status: DC | PRN

## 2012-10-17 NOTE — Patient Instructions (Addendum)
Plain Mucinex for thick secretions ;force NON dairy fluids . Use a Neti pot daily as needed for sinus congestion; going from open side to congested side . Nasal cleansing in the shower as discussed. Make sure that all residual soap is removed to prevent irritation. Fluticasone 1 spray in each nostril twice a day as needed. Use the "crossover" technique as discussed. Plain Allegra 160 daily as needed for itchy eyes & sneezing.    
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
## 2012-10-22 ENCOUNTER — Other Ambulatory Visit: Payer: Self-pay | Admitting: Internal Medicine

## 2012-10-22 NOTE — Telephone Encounter (Signed)
Hopp please advise on refill request for ABX

## 2012-10-22 NOTE — Telephone Encounter (Signed)
Z-Pak can be refilled; but it should be taken one pill daily for 6 days rather than 2 pills the first day.

## 2012-10-22 NOTE — Telephone Encounter (Signed)
Discuss with patient, Rx sent. 

## 2012-11-10 ENCOUNTER — Other Ambulatory Visit: Payer: Self-pay | Admitting: Internal Medicine

## 2012-11-10 NOTE — Telephone Encounter (Signed)
OK X1 

## 2012-11-10 NOTE — Telephone Encounter (Signed)
Dr.Hopper please advise on request for cough med

## 2012-11-24 ENCOUNTER — Other Ambulatory Visit: Payer: Self-pay | Admitting: Internal Medicine

## 2013-01-10 ENCOUNTER — Other Ambulatory Visit: Payer: Self-pay

## 2013-02-10 ENCOUNTER — Other Ambulatory Visit: Payer: Self-pay | Admitting: Internal Medicine

## 2013-02-26 ENCOUNTER — Encounter: Payer: Self-pay | Admitting: Internal Medicine

## 2013-02-26 ENCOUNTER — Ambulatory Visit (INDEPENDENT_AMBULATORY_CARE_PROVIDER_SITE_OTHER): Payer: Federal, State, Local not specified - PPO | Admitting: Internal Medicine

## 2013-02-26 VITALS — BP 122/80 | HR 86 | Temp 98.0°F | Wt 215.0 lb

## 2013-02-26 DIAGNOSIS — E039 Hypothyroidism, unspecified: Secondary | ICD-10-CM

## 2013-02-26 DIAGNOSIS — R7309 Other abnormal glucose: Secondary | ICD-10-CM

## 2013-02-26 DIAGNOSIS — E785 Hyperlipidemia, unspecified: Secondary | ICD-10-CM

## 2013-02-26 DIAGNOSIS — J0100 Acute maxillary sinusitis, unspecified: Secondary | ICD-10-CM

## 2013-02-26 LAB — MICROALBUMIN / CREATININE URINE RATIO
Creatinine,U: 170.1 mg/dL
Microalb, Ur: 0.9 mg/dL (ref 0.0–1.9)

## 2013-02-26 LAB — TSH: TSH: 4.47 u[IU]/mL (ref 0.35–5.50)

## 2013-02-26 LAB — HEPATIC FUNCTION PANEL
ALT: 27 U/L (ref 0–35)
Albumin: 4 g/dL (ref 3.5–5.2)
Total Protein: 7.9 g/dL (ref 6.0–8.3)

## 2013-02-26 LAB — LIPID PANEL
HDL: 38.2 mg/dL — ABNORMAL LOW (ref >39.00)
Triglycerides: 132 mg/dL (ref 0.0–149.0)

## 2013-02-26 MED ORDER — CLARITHROMYCIN ER 500 MG PO TB24: 1000.0000 mg | ORAL_TABLET | Freq: Every day | ORAL | Status: DC

## 2013-02-26 NOTE — Patient Instructions (Addendum)
Hold the simvastatin until you've completed the antibiotic; it could raise the level of the statin. Plain Mucinex (NOT D) for thick secretions ;force NON dairy fluids .   Nasal cleansing in the shower as discussed with lather of mild shampoo.After 10 seconds wash off lather while  exhaling through nostrils. Make sure that all residual soap is removed to prevent irritation.  Fluticasone 1 spray in each nostril twice a day as needed. Use the "crossover" technique into opposite nostril spraying toward opposite ear @ 45 degree angle, not straight up into nostril.  Use a Neti pot daily only  as needed for significant sinus congestion; going from open side to congested side . Plain Allegra (NOT D )  160 daily , Loratidine 10 mg , OR Zyrtec 10 mg @ bedtime  as needed for itchy eyes & sneezing.
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
## 2013-02-27 ENCOUNTER — Telehealth: Payer: Self-pay | Admitting: Internal Medicine

## 2013-02-27 NOTE — Telephone Encounter (Signed)
Patient Information:  Caller Name: Chaelyn  Phone: [phone]  Patient: Holly Obrien, Holly Obrien  Gender: Female  DOB: [birth year]  Age: 62 Years  PCP: Marga Melnick  Office Follow Up:  Does the office need to follow up with this patient?: No  Instructions For The Office: N/A   Symptoms  Reason For Call & Symptoms: Seen in office on 02/26/13 and dx with sinus infection.  R eye had been watery and this morning is red and felt irritated when she first woke up and had tiny bit of yellowish drainage in corner of eye-02/27/13. Afebrile. She cleaned eye wih warm compress and looks better and discharge has not come back.  Reviewed Health History In EMR: Yes  Reviewed Medications In EMR: Yes  Reviewed Allergies In EMR: Yes  Reviewed Surgeries / Procedures: Yes  Date of Onset of Symptoms: 02/27/2013  Treatments Tried: Warm Compress, taking oral anitbiotic for sinusitis  Treatments Tried Worked: No  Guideline(s) Used:  Eye - Pus or Discharge  Eye - Red Without Pus  Disposition Per Guideline:   Home Care  Reason For Disposition Reached:   Red eye and no complications  Advice Given:  Red Eye Caused by Pinkeye:  Reassurance: It is reassuring that you have no blurred vision and that there is minimal to no discomfort. One cause of minor eye redness is pinkeye (viral conjunctivitis). People with pinkeye may often note mild eye irritation and a watery discharge. Often it affects both eyes. It can occur with a cold. It generally is not serious.  Red Eye Caused by Pinkeye:  Reassurance: It is reassuring that you have no blurred vision and that there is minimal to no discomfort. One cause of minor eye redness is pinkeye (viral conjunctivitis). People with pinkeye may often note mild eye irritation and a watery discharge. Often it affects both eyes. It can occur with a cold. It generally is not serious.  No Rubbing: Do not rub your eyes (Reason: can cause a corneal abrasion or worsen an existing abrasion).  Contagiousness: Pinkeye is extremely contagious. Try not to touch your eyes. Wash your hands frequently. Do not share towels.  Expected Course: Pinkeye with a cold usually lasts about 7 days.  Call Back If:  Blurred vision or increasing pain  No improvement  You become worse.  Patient Will Follow Care Advice:  YES

## 2013-02-27 NOTE — Telephone Encounter (Signed)
Noted, Dr.Hopper verbally informed  

## 2013-03-16 ENCOUNTER — Other Ambulatory Visit: Payer: Self-pay | Admitting: Internal Medicine

## 2013-03-17 ENCOUNTER — Other Ambulatory Visit: Payer: Self-pay

## 2013-03-17 DIAGNOSIS — Z1231 Encounter for screening mammogram for malignant neoplasm of breast: Secondary | ICD-10-CM

## 2013-03-18 ENCOUNTER — Other Ambulatory Visit: Payer: Self-pay | Admitting: Nurse Practitioner

## 2013-03-18 NOTE — Telephone Encounter (Signed)
Pt's last aex was 03/13/12. No new aex scheduled. LVM for pt to return my call to schedule aex. Please advise.

## 2013-03-18 NOTE — Telephone Encounter (Signed)
One refill only until aex    #30

## 2013-03-19 ENCOUNTER — Other Ambulatory Visit: Payer: Self-pay | Admitting: *Deleted

## 2013-03-19 MED ORDER — ESTROGENS CONJUGATED 0.45 MG PO TABS: 0.4500 mg | ORAL_TABLET | Freq: Every day | ORAL | Status: DC

## 2013-03-19 NOTE — Telephone Encounter (Signed)
#  30/ [facility]. LVM for pt to call back to schedule an aex.

## 2013-04-08 ENCOUNTER — Ambulatory Visit
Admission: RE | Admit: 2013-04-08 | Discharge: 2013-04-08 | Disposition: A | Discharge status: Home / Self Care | Payer: Federal, State, Local not specified - PPO | Source: Ambulatory Visit

## 2013-04-08 DIAGNOSIS — Z1231 Encounter for screening mammogram for malignant neoplasm of breast: Secondary | ICD-10-CM

## 2013-04-09 ENCOUNTER — Other Ambulatory Visit: Payer: Self-pay | Admitting: Nurse Practitioner

## 2013-04-09 DIAGNOSIS — R928 Other abnormal and inconclusive findings on diagnostic imaging of breast: Secondary | ICD-10-CM

## 2013-04-16 ENCOUNTER — Ambulatory Visit (INDEPENDENT_AMBULATORY_CARE_PROVIDER_SITE_OTHER): Payer: Federal, State, Local not specified - PPO | Admitting: Nurse Practitioner

## 2013-04-16 ENCOUNTER — Encounter: Payer: Self-pay | Admitting: Family Medicine

## 2013-04-16 ENCOUNTER — Encounter: Payer: Self-pay | Admitting: Nurse Practitioner

## 2013-04-16 ENCOUNTER — Ambulatory Visit (INDEPENDENT_AMBULATORY_CARE_PROVIDER_SITE_OTHER): Payer: Federal, State, Local not specified - PPO | Admitting: Family Medicine

## 2013-04-16 VITALS — BP 134/74 | HR 76 | Ht 60.0 in | Wt 217.2 lb

## 2013-04-16 VITALS — BP 140/80 | HR 80 | Temp 98.1°F | Ht 63.0 in | Wt 220.4 lb

## 2013-04-16 DIAGNOSIS — N951 Menopausal and female climacteric states: Secondary | ICD-10-CM

## 2013-04-16 DIAGNOSIS — T6391XA Toxic effect of contact with unspecified venomous animal, accidental (unintentional), initial encounter: Secondary | ICD-10-CM

## 2013-04-16 DIAGNOSIS — Z780 Asymptomatic menopausal state: Secondary | ICD-10-CM

## 2013-04-16 DIAGNOSIS — T63481A Toxic effect of venom of other arthropod, accidental (unintentional), initial encounter: Secondary | ICD-10-CM

## 2013-04-16 DIAGNOSIS — Z0000 Encounter for general adult medical examination without abnormal findings: Secondary | ICD-10-CM

## 2013-04-16 DIAGNOSIS — Z01419 Encounter for gynecological examination (general) (routine) without abnormal findings: Secondary | ICD-10-CM

## 2013-04-16 DIAGNOSIS — W57XXXA Bitten or stung by nonvenomous insect and other nonvenomous arthropods, initial encounter: Secondary | ICD-10-CM | POA: Insufficient documentation

## 2013-04-16 LAB — POCT URINALYSIS DIPSTICK: Leukocytes, UA: NEGATIVE

## 2013-04-16 MED ORDER — ESTROGENS CONJUGATED 0.3 MG PO TABS: 0.3000 mg | ORAL_TABLET | Freq: Every day | ORAL | Status: DC

## 2013-04-16 MED ORDER — PREDNISONE 10 MG PO TABS: ORAL_TABLET | ORAL | Status: DC

## 2013-04-16 MED ORDER — DOXYCYCLINE HYCLATE 100 MG PO TABS: 100.0000 mg | ORAL_TABLET | Freq: Two times a day (BID) | ORAL | Status: DC

## 2013-04-16 MED ORDER — METHYLPREDNISOLONE ACETATE 80 MG/ML IJ SUSP
80.0000 mg | Freq: Once | INTRAMUSCULAR | Status: AC
  Administered 2013-04-16: 80 mg via INTRAMUSCULAR

## 2013-04-16 NOTE — Patient Instructions (Addendum)
This appears to be a localized allergic reaction to whatever got you Start the Prednisone tomorrow- take w/ food If over the weekend you have increased pain, swelling, or redness- start the Doxy for possible infection ICE! Call with any questions or concerns Hang in there!

## 2013-04-16 NOTE — Progress Notes (Signed)
  Subjective:    Patient ID: Holly Obrien, female    DOB: 02-26-1951, 62 y.o.   MRN: [id number]  HPI Bites- R hand and area near elbow red and swollen.  Reports last night areas were itchy.  Today not itchy, now throbbing.  2 bites after sitting on patio last night.  No hx of allergic rxn to stings or bites.  No trouble breathing, no lip or tongue swelling.   Review of Systems For ROS see HPI     Objective:   Physical Exam  Vitals reviewed. Constitutional: She is oriented to person, place, and time. She appears well-developed and well-nourished. No distress.  HENT:  No swelling of lips or tongue  Cardiovascular: Intact distal pulses.   Musculoskeletal: Normal range of motion.  Neurological: She is alert and oriented to person, place, and time.  Skin: Skin is warm and dry. There is erythema (redness and induration surrounding bite sites on R hand and near R elbow).          Assessment & Plan:

## 2013-04-16 NOTE — Patient Instructions (Signed)

## 2013-04-16 NOTE — Progress Notes (Signed)
62 y.o. G2. Married Caucasian Fe here for annual exam.  Recent thyroid changes and dosages were changed by PCP, now to recheck in 6 months.  No vaso. For follow up on mammogram 04/23/13.   Patient's last menstrual period was 05/01/1987.          Sexually active: no  The current method of family planning is none.    Exercising: no  The patient does not participate in regular exercise at present. Smoker:  no  Health Maintenance: Pap:  Normal before hysterectomy MMG:  04/13/13 for recall secondary to finding in right breast Colonoscopy:  Pt unsure.  BMD:   Pt unsure.  TDaP:  PCP updates.  Labs: PCP (Dr. Alwyn Ren) does lab (blood) work.    reports that she has never smoked. She has never used smokeless tobacco. She reports that she does not drink alcohol or use illicit drugs.  Past Medical History  Diagnosis Date  . Asthma     with URI's  . Thyroid disease     hypothyroidism  . Hyperlipidemia     NMR 2007  . Esophagitis     HH/ERD    Past Surgical History  Procedure Laterality Date  . Abdominal hysterectomy  05/01/87    Hysterectomy and bladder tact-- Dr Phyllis Ginger  . Oophorectomy  09/11/94, 01/14/96    1985-left ovary removed. 1997 Right ovary removed--Dr Phyllis Ginger  . Pubovaginal sling  1997    Dr Vernie Ammons  . Carpal tunnel release  06/18/00    right wrist -- Dr Jeannie Fend  . Finger surgery  12/10/06    left index finger injury--Dr Josephine Igo    Current Outpatient Prescriptions  Medication Sig Dispense Refill  . albuterol (VENTOLIN HFA) 108 (90 BASE) MCG/ACT inhaler 1-2 puffs q 4 hrs prn  18 g  2  . aspirin 81 MG tablet Take 81 mg by mouth daily.      . B Complex Vitamins (B-COMPLEX/B-12 PO) Take 1,000 mcg by mouth every morning.       Marland Kitchen CALCIUM-MAGNESIUM PO Take 1 tablet by mouth daily.        . Cholecalciferol (VITAMIN D3) 1000 UNITS CAPS Take by mouth. 2 BY MOUTH DAILY      . estrogens, conjugated, (PREMARIN) 0.45 MG tablet Take 1 tablet (0.45 mg total) by mouth daily.  Take daily for 21 days then do not take for 7 days.  30 tablet  0  . fluticasone (FLONASE) 50 MCG/ACT nasal spray 1 spray in each nostril twice a day as needed. Use the "crossover" technique as discussed  16 g  11  . Fluticasone-Salmeterol (ADVAIR DISKUS) 250-50 MCG/DOSE AEPB Inhale 1 puff into the lungs 2 (two) times daily as needed.       Marland Kitchen levothyroxine (SYNTHROID, LEVOTHROID) 25 MCG tablet TAKE 1 TABLET DAILY EXCEPT ON TUESDAY & THURSDAY TAKE 1 & 1/2 TABLETS. LABS DUE  36 tablet  11  . Multiple Vitamins-Minerals (ECHINACEA ACZ PO) Take 1 tablet by mouth daily.        . pantoprazole (PROTONIX) 40 MG tablet TAKE 1 TABLET BY MOUTH EVERY DAY  30 tablet  5  . simvastatin (ZOCOR) 40 MG tablet 1 by mouth daily at bedtime  90 tablet  0  . clarithromycin (BIAXIN XL) 500 MG 24 hr tablet Take 2 tablets (1,000 mg total) by mouth daily.  14 tablet  0   No current facility-administered medications for this visit.    Family History  Problem Relation Age of  Onset  . Arthritis Other   . Depression Other   . Thyroid disease Other     hyperthyroidism  . Stroke Other   . Heart disease Other     CABG  . Diabetes Other     hypoglycemia    ROS:  Pertinent items are noted in HPI.  Otherwise, a comprehensive ROS was negative.  Exam:   BP 134/74  Pulse 76  Ht 5' (1.524 m)  Wt 217 lb 3.2 oz (98.521 kg)  BMI 42.42 kg/m2  LMP 05/01/1987 Height: 5' (152.4 cm)  Ht Readings from Last 3 Encounters:  04/16/13 5' (1.524 m)  05/18/11 5' 3.5" (1.613 m)  05/30/10 5' 3.5" (1.613 m)    General appearance: alert, cooperative and appears stated age Head: Normocephalic, without obvious abnormality, atraumatic Neck: no adenopathy, supple, symmetrical, trachea midline and thyroid normal to inspection and palpation Lungs: clear to auscultation bilaterally Breasts: normal appearance, no masses or tenderness Heart: regular rate and rhythm Abdomen: soft, non-tender; no masses,  no organomegaly Extremities:  extremities normal, atraumatic, no cyanosis or edema Skin: Skin color, texture, turgor normal. No rashes or lesions Lymph nodes: Cervical, supraclavicular, and axillary nodes normal. No abnormal inguinal nodes palpated Neurologic: Grossly normal   Pelvic: External genitalia:  no lesions              Urethra:  normal appearing urethra with no masses, tenderness or lesions              Bartholin's and Skene's: normal                 Vagina: normal appearing vagina with normal color and discharge, no lesions              Cervix: absent              Pap taken: no Bimanual Exam:  Uterus:  uterus absent              Adnexa: no mass, fullness, tenderness               Rectovaginal: Confirms               Anus:  normal sphincter tone, no lesions  A:  Well Woman with normal exam  S/P TAH 1989, later BSO 1997 and sling  Recent  Abnormal Mammo with recall for [place] that is pending  P:   Pap smear as per guidelines - not indicated  Mammogram as per recall  counseled on osteoporosis, adequate intake of calcium and vitamin D, diet and exercise  return annually or prn  An After Visit Summary was printed and given to the patient.

## 2013-04-21 NOTE — Progress Notes (Signed)
Thanks - it is done

## 2013-04-21 NOTE — Assessment & Plan Note (Signed)
New.  Suspect pt's redness and induration is localized response to bite/sting.  Start prednisone for pain, inflammation.  Pt also given script for doxy to have on hand over the weekend in case the redness worsened or progressed, or she developed pus at site of skin penetration.  Reviewed supportive care and red flags that should prompt return.  Pt expressed understanding and is in agreement w/ plan.

## 2013-04-22 ENCOUNTER — Ambulatory Visit
Admission: RE | Admit: 2013-04-22 | Discharge: 2013-04-22 | Disposition: A | Discharge status: Home / Self Care | Payer: Federal, State, Local not specified - PPO | Source: Ambulatory Visit | Attending: Nurse Practitioner | Admitting: Nurse Practitioner

## 2013-04-22 DIAGNOSIS — R928 Other abnormal and inconclusive findings on diagnostic imaging of breast: Secondary | ICD-10-CM

## 2013-04-23 ENCOUNTER — Other Ambulatory Visit: Payer: Federal, State, Local not specified - PPO

## 2013-04-23 NOTE — Progress Notes (Signed)
Encounter reviewed by Dr. Brook Silva.  

## 2013-04-23 NOTE — Progress Notes (Signed)
This patient can come out of mammogram hold.

## 2013-05-25 ENCOUNTER — Other Ambulatory Visit: Payer: Self-pay | Admitting: Internal Medicine

## 2013-05-26 ENCOUNTER — Other Ambulatory Visit: Payer: Self-pay | Admitting: Internal Medicine

## 2013-05-26 NOTE — Telephone Encounter (Signed)
Refill done per protocol.  

## 2013-10-01 ENCOUNTER — Other Ambulatory Visit: Payer: Self-pay

## 2013-10-26 ENCOUNTER — Encounter: Payer: Self-pay | Admitting: Internal Medicine

## 2013-10-26 ENCOUNTER — Ambulatory Visit (INDEPENDENT_AMBULATORY_CARE_PROVIDER_SITE_OTHER): Payer: Federal, State, Local not specified - PPO | Admitting: Internal Medicine

## 2013-10-26 VITALS — BP 139/83 | HR 81 | Temp 98.0°F | Ht 63.75 in | Wt 222.6 lb

## 2013-10-26 DIAGNOSIS — J310 Chronic rhinitis: Secondary | ICD-10-CM

## 2013-10-26 DIAGNOSIS — E785 Hyperlipidemia, unspecified: Secondary | ICD-10-CM

## 2013-10-26 DIAGNOSIS — R05 Cough: Secondary | ICD-10-CM

## 2013-10-26 DIAGNOSIS — E039 Hypothyroidism, unspecified: Secondary | ICD-10-CM

## 2013-10-26 DIAGNOSIS — Z8249 Family history of ischemic heart disease and other diseases of the circulatory system: Secondary | ICD-10-CM

## 2013-10-26 MED ORDER — SIMVASTATIN 40 MG PO TABS: ORAL_TABLET | ORAL | Status: DC
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
## 2013-10-26 NOTE — Patient Instructions (Signed)
Plain Mucinex (NOT D) for thick secretions ;force NON dairy fluids .   Nasal cleansing in the shower as discussed with lather of mild shampoo.After 10 seconds wash off lather while  exhaling through nostrils. Make sure that all residual soap is removed to prevent irritation.  Fluticasone 1 spray in each nostril twice a day as needed. Use the "crossover" technique into opposite nostril spraying toward opposite ear @ 45 degree angle, not straight up into nostril.  Use a Neti pot daily only  as needed for significant sinus congestion; going from open side to congested side . Plain Allegra (NOT D )  160 daily , Loratidine 10 mg , OR Zyrtec 10 mg @ bedtime  as needed for itchy eyes & sneezing.   Advair one inhalation every 12 hours; gargle and spit after use 

## 2013-10-26 NOTE — Progress Notes (Signed)
Pre visit review using our clinic review tool, if applicable. No additional management support is needed unless otherwise documented below in the visit note. 

## 2013-11-13 ENCOUNTER — Telehealth: Payer: Self-pay | Admitting: Internal Medicine

## 2013-11-13 NOTE — Telephone Encounter (Signed)
Patient states that her insurance is denying her refill for pantoprazole (PROTONIX) 40 MG because she can no longer have it for >90 days a year. Patient states that we need to call the insurance company for them to override this. Please advise. She states that her pharmacy sent [place] a prior authorization form 10 days ago.

## 2013-11-17 ENCOUNTER — Telehealth: Payer: Self-pay | Admitting: *Deleted

## 2013-11-17 NOTE — Telephone Encounter (Signed)
Pantoprazole prior authorization request started/ JG//CMA

## 2013-11-17 NOTE — Telephone Encounter (Signed)
Prior authorization started. JG//CMA 

## 2013-12-01 NOTE — Telephone Encounter (Signed)
Patient states that Holly Obrien asks that Dr. Linna Darner call Ph#: [phone] for override. Patient would then like a call with a status update so she knows what is going on. Please advise.

## 2013-12-02 ENCOUNTER — Other Ambulatory Visit: Payer: Self-pay | Admitting: *Deleted

## 2013-12-02 ENCOUNTER — Telehealth: Payer: Self-pay | Admitting: *Deleted

## 2013-12-02 MED ORDER — PANTOPRAZOLE SODIUM 40 MG PO TBEC: DELAYED_RELEASE_TABLET | ORAL | Status: DC

## 2013-12-02 NOTE — Telephone Encounter (Signed)
PA approved, pt notified. JG//CMA

## 2013-12-02 NOTE — Telephone Encounter (Signed)
Called and spoke with insurance company. Protonix approved from 10/02/2013 until 12/02/2014. Pt aware. JG//CMA

## 2014-01-05 ENCOUNTER — Other Ambulatory Visit (INDEPENDENT_AMBULATORY_CARE_PROVIDER_SITE_OTHER): Payer: Federal, State, Local not specified - PPO

## 2014-01-05 DIAGNOSIS — E039 Hypothyroidism, unspecified: Secondary | ICD-10-CM

## 2014-01-05 DIAGNOSIS — E785 Hyperlipidemia, unspecified: Secondary | ICD-10-CM

## 2014-01-05 LAB — LIPID PANEL
Cholesterol: 139 mg/dL (ref 0–200)
HDL: 40 mg/dL (ref >39.00)
LDL Cholesterol: 67 mg/dL (ref 0–99)
Total CHOL/HDL Ratio: 3
Triglycerides: 159 mg/dL — ABNORMAL HIGH (ref 0.0–149.0)
VLDL: 31.8 mg/dL (ref 0.0–40.0)

## 2014-01-05 LAB — HEPATIC FUNCTION PANEL
ALBUMIN: 3.9 g/dL (ref 3.5–5.2)
ALT: 31 U/L (ref 0–35)
AST: 32 U/L (ref 0–37)
Alkaline Phosphatase: 122 U/L — ABNORMAL HIGH (ref 39–117)
BILIRUBIN TOTAL: 0.7 mg/dL (ref 0.3–1.2)
Bilirubin, Direct: 0.1 mg/dL (ref 0.0–0.3)
Total Protein: 7.8 g/dL (ref 6.0–8.3)

## 2014-01-05 LAB — CK: CK TOTAL: 88 U/L (ref 7–177)

## 2014-01-05 LAB — TSH: TSH: 6.34 u[IU]/mL — AB (ref 0.35–5.50)

## 2014-01-09 ENCOUNTER — Other Ambulatory Visit: Payer: Self-pay | Admitting: Internal Medicine

## 2014-01-09 DIAGNOSIS — E039 Hypothyroidism, unspecified: Secondary | ICD-10-CM

## 2014-01-09 MED ORDER — LEVOTHYROXINE SODIUM 25 MCG PO TABS: ORAL_TABLET | ORAL | Status: DC

## 2014-01-13 ENCOUNTER — Telehealth: Payer: Self-pay | Admitting: *Deleted

## 2014-01-13 DIAGNOSIS — E785 Hyperlipidemia, unspecified: Secondary | ICD-10-CM

## 2014-01-13 MED ORDER — SIMVASTATIN 40 MG PO TABS
ORAL_TABLET | ORAL | Status: DC
Start: 2014-01-13 — End: 2014-07-30

## 2014-01-13 NOTE — Addendum Note (Signed)
Addended by: Lowry Ram on: 01/13/2014 11:08 AM   Modules accepted: Orders

## 2014-01-13 NOTE — Telephone Encounter (Signed)
Notified patient of MD response & recommendations.  Stated she only had one refill remaining, refilled per protocol.

## 2014-01-13 NOTE — Telephone Encounter (Signed)
   Simvastatin should not be changed; recheck fasting lipids annually. Lab results will be mailed as I addressed them before she signed up for My Chart.

## 2014-01-13 NOTE — Telephone Encounter (Signed)
Patient phoned requesting refill on her simvistatin and to obtain her lab results.  Needs to know if, upon review of her labs drawn 2/10 and 2/14 if her simvistatin dosing/treatment plan would need to be changed.  Please advise.  CB# [phone]

## 2014-01-24 HISTORY — PX: TRIGGER FINGER RELEASE: SHX641

## 2014-03-15 ENCOUNTER — Other Ambulatory Visit: Payer: Self-pay

## 2014-03-15 MED ORDER — LEVOTHYROXINE SODIUM 25 MCG PO TABS: ORAL_TABLET | ORAL | Status: DC

## 2014-03-17 ENCOUNTER — Other Ambulatory Visit: Payer: Self-pay

## 2014-03-17 MED ORDER — LEVOTHYROXINE SODIUM 25 MCG PO TABS: ORAL_TABLET | ORAL | Status: DC

## 2014-04-20 ENCOUNTER — Other Ambulatory Visit: Payer: Self-pay | Admitting: *Deleted

## 2014-04-20 DIAGNOSIS — Z780 Asymptomatic menopausal state: Secondary | ICD-10-CM

## 2014-04-20 MED ORDER — ESTROGENS CONJUGATED 0.3 MG PO TABS: 0.3000 mg | ORAL_TABLET | Freq: Every day | ORAL | Status: DC

## 2014-04-20 NOTE — Telephone Encounter (Signed)
Faxed refill request received from [facility] for PREMARIN TAB Last filled by MD on 04/16/13, #90 X 3 Last AEX - 04/16/13 Next AEX - 06/03/14 RX x 1 sent until AEX

## 2014-05-10 ENCOUNTER — Other Ambulatory Visit: Payer: Self-pay

## 2014-05-10 MED ORDER — LEVOTHYROXINE SODIUM 25 MCG PO TABS: ORAL_TABLET | ORAL | Status: DC

## 2014-06-03 ENCOUNTER — Ambulatory Visit: Payer: Federal, State, Local not specified - PPO | Admitting: Nurse Practitioner

## 2014-07-14 ENCOUNTER — Other Ambulatory Visit: Payer: Self-pay | Admitting: Nurse Practitioner

## 2014-07-14 NOTE — Telephone Encounter (Deleted)
Last AEX 03/2013  Last refill 04/20/14 #90/0 R AEX appt 06/03/14 Canceled by provider.

## 2014-07-14 NOTE — Telephone Encounter (Signed)
Last AEX: 04/16/13 With Ms. Holly Obrien Last refilled: 04/20/14 #90/0 refills Last Mammogram: 04/23/13 Bi-Rads 2 Aex scheduled 07/30/14   Please advise.  Routed to Ms. Holly Obrien given Ms. Holly Obrien is out of office today

## 2014-07-30 ENCOUNTER — Encounter: Payer: Self-pay | Admitting: Nurse Practitioner

## 2014-07-30 ENCOUNTER — Ambulatory Visit (INDEPENDENT_AMBULATORY_CARE_PROVIDER_SITE_OTHER): Payer: Federal, State, Local not specified - PPO | Admitting: Nurse Practitioner

## 2014-07-30 VITALS — BP 130/92 | HR 60 | Ht 63.0 in | Wt 217.0 lb

## 2014-07-30 DIAGNOSIS — E2839 Other primary ovarian failure: Secondary | ICD-10-CM

## 2014-07-30 DIAGNOSIS — Z01419 Encounter for gynecological examination (general) (routine) without abnormal findings: Secondary | ICD-10-CM

## 2014-07-30 DIAGNOSIS — E039 Hypothyroidism, unspecified: Secondary | ICD-10-CM

## 2014-07-30 DIAGNOSIS — Z1211 Encounter for screening for malignant neoplasm of colon: Secondary | ICD-10-CM

## 2014-07-30 MED ORDER — ESTROGENS CONJUGATED 0.3 MG PO TABS: ORAL_TABLET | ORAL | Status: DC

## 2014-07-30 NOTE — Patient Instructions (Signed)

## 2014-07-30 NOTE — Progress Notes (Signed)
Patient ID: Holly Obrien, female   DOB: 11/07/1951, 63 y.o.   MRN: [id number] 63 y.o. G2P2000 Married Caucasian Fe here for annual exam.  She has had trouble with thyroid dosing recently.  In February she had a TSH at Dr. Clayborn Heron office and was elevated at 6.34.  She did not have a recheck and asked that we repeat today.  Patient's last menstrual period was 05/01/1987.          Sexually active: no  The current method of family planning is none.  Exercising: no The patient does not participate in regular exercise at present.  Smoker: no   Health Maintenance:  Pap: Normal before hysterectomy  MMG: 04/13/13 for recall secondary to finding in right breast   (will schedule) Colonoscopy: 2/42008 diverticulitis recheck in 5 years - [facility]  BMD: 2007 normal TDaP: PCP updates.  Labs:  PCP in EPIC     reports that she has never smoked. She has never used smokeless tobacco. She reports that she does not drink alcohol or use illicit drugs.  Past Medical History  Diagnosis Date  . Asthma     with URI's  . Thyroid disease     hypothyroidism  . Hyperlipidemia     NMR 2007  . Esophagitis     HH/ERD    Past Surgical History  Procedure Laterality Date  . Abdominal hysterectomy  05/01/87    Hysterectomy and bladder tact-- Dr Molli Hazard  . Oophorectomy  09/11/94, 01/14/96    1985-left ovary removed. 1997 Right ovary removed--Dr Molli Hazard  . Pubovaginal sling  1997    Dr Karsten Ro  . Carpal tunnel release  06/18/00    right wrist -- Dr Tamala Fothergill  . Finger surgery  12/10/06    left index finger injury--Dr Theodis Sato  . Trigger finger release Right 01/2014    Current Outpatient Prescriptions  Medication Sig Dispense Refill  . albuterol (VENTOLIN HFA) 108 (90 BASE) MCG/ACT inhaler 1-2 puffs q 4 hrs prn  18 g  2  . aspirin 81 MG tablet Take 81 mg by mouth daily.      . B Complex Vitamins (B-COMPLEX/B-12 PO) Take 1,000 mcg by mouth every morning.       . Calcium Carb-Cholecalciferol  (CALCIUM 600/VITAMIN D3) 600-800 MG-UNIT TABS Take 1 tablet by mouth daily.      . Cholecalciferol (VITAMIN D3) 2000 UNITS TABS Take 1 tablet by mouth daily.      . Coenzyme Q10 200 MG capsule Take 200 mg by mouth daily.      Marland Kitchen estrogens, conjugated, (PREMARIN) 0.3 MG tablet 1 tablet daily  30 tablet  0  . fluticasone (FLONASE) 50 MCG/ACT nasal spray 1 spray in each nostril twice a day as needed. Use the "crossover" technique as discussed  16 g  11  . Fluticasone-Salmeterol (ADVAIR DISKUS) 250-50 MCG/DOSE AEPB Inhale 1 puff into the lungs 2 (two) times daily as needed.       Marland Kitchen levothyroxine (SYNTHROID, LEVOTHROID) 25 MCG tablet 1&1/2 M,W,F ,& Sun. One pill T,Th & Sat  108 tablet  1  . Magnesium 500 MG CAPS Take 1 capsule by mouth daily.      . Multiple Vitamins-Minerals (ECHINACEA ACZ PO) Take 1 tablet by mouth daily.        . pantoprazole (PROTONIX) 40 MG tablet TAKE 1 TABLET BY MOUTH EVERY DAY  90 tablet  3  . simvastatin (ZOCOR) 40 MG tablet Take 20 mg by mouth at  bedtime.      Marland Kitchen zinc gluconate 50 MG tablet Take 50 mg by mouth daily.       No current facility-administered medications for this visit.    Family History  Problem Relation Age of Onset  . Arthritis Other   . Depression Other   . Thyroid disease Other     hyperthyroidism  . Stroke Other   . Heart disease Other     CABG  . Diabetes Other     hypoglycemia  . Osteoarthritis Mother   . Heart failure Father   . Cancer Father   . Heart failure Brother   . Hypertension Brother     ROS:  Pertinent items are noted in HPI.  Otherwise, a comprehensive ROS was negative.  Exam:   BP 130/92  Pulse 60  Ht 5\' 3"  (1.6 m)  Wt 217 lb (98.431 kg)  BMI 38.45 kg/m2  LMP 05/01/1987 Height: 5\' 3"  (160 cm)  Ht Readings from Last 3 Encounters:  07/30/14 5\' 3"  (1.6 m)  10/26/13 5' 3.75" (1.619 m)  04/16/13 5\' 3"  (1.6 m)    General appearance: alert, cooperative and appears stated age Head: Normocephalic, without obvious  abnormality, atraumatic Neck: no adenopathy, supple, symmetrical, trachea midline and thyroid normal to inspection and palpation Lungs: clear to auscultation bilaterally Breasts: normal appearance, no masses or tenderness Heart: regular rate and rhythm Abdomen: soft, non-tender; no masses,  no organomegaly Extremities: extremities normal, atraumatic, no cyanosis or edema Skin: Skin color, texture, turgor normal. No rashes or lesions Lymph nodes: Cervical, supraclavicular, and axillary nodes normal. No abnormal inguinal nodes palpated Neurologic: Grossly normal   Pelvic: External genitalia:  no lesions              Urethra:  normal appearing urethra with no masses, tenderness or lesions              Bartholin's and Skene's: normal                 Vagina: normal appearing vagina with normal color and discharge, no lesions              Cervix: absent              Pap taken: No. Bimanual Exam:  Uterus:  uterus absent              Adnexa: no mass, fullness, tenderness               Rectovaginal: Confirms               Anus:  normal sphincter tone, no lesions  A:  Well Woman with normal exam  S/P TAH 1989, later BSO 1997 and sling   History of Abnormal Mammo and yearly Mammo is pending  History of hypothyroid  History of hypercholesterolemia, GERD,   P:   Reviewed health and wellness pertinent to exam  Pap smear taken not today  Mammogram is scheduled  Order placed to recheck BMD  Order place for referral back to GI for repeat colonoscopy  Refill Premarin 0.3 mg daily # 30 only until Mammo is done  Will check TSH and send results to PCP - Dr. Ignatius Specking on breast self exam, mammography screening, adequate intake of calcium and vitamin D, diet and exercise, Kegel's exercises return annually or prn  An After Visit Summary was printed and given to the patient.

## 2014-07-31 LAB — TSH: TSH: 5.739 u[IU]/mL — AB (ref 0.350–4.500)

## 2014-08-02 NOTE — Progress Notes (Signed)
Encounter reviewed by Dr. Brook Silva.  

## 2014-08-03 ENCOUNTER — Other Ambulatory Visit: Payer: Self-pay

## 2014-08-03 DIAGNOSIS — Z1231 Encounter for screening mammogram for malignant neoplasm of breast: Secondary | ICD-10-CM

## 2014-08-04 ENCOUNTER — Ambulatory Visit (HOSPITAL_COMMUNITY)
Admission: RE | Admit: 2014-08-04 | Discharge: 2014-08-04 | Disposition: A | Discharge status: Home / Self Care | Payer: Federal, State, Local not specified - PPO | Source: Ambulatory Visit | Attending: Nurse Practitioner | Admitting: Nurse Practitioner

## 2014-08-04 DIAGNOSIS — Z13820 Encounter for screening for osteoporosis: Secondary | ICD-10-CM | POA: Insufficient documentation

## 2014-08-04 DIAGNOSIS — E2839 Other primary ovarian failure: Secondary | ICD-10-CM

## 2014-08-04 DIAGNOSIS — Z780 Asymptomatic menopausal state: Secondary | ICD-10-CM | POA: Insufficient documentation

## 2014-08-13 ENCOUNTER — Ambulatory Visit: Payer: Federal, State, Local not specified - PPO

## 2014-08-24 ENCOUNTER — Ambulatory Visit
Admission: RE | Admit: 2014-08-24 | Discharge: 2014-08-24 | Disposition: A | Discharge status: Home / Self Care | Payer: Federal, State, Local not specified - PPO | Source: Ambulatory Visit

## 2014-08-24 DIAGNOSIS — Z1231 Encounter for screening mammogram for malignant neoplasm of breast: Secondary | ICD-10-CM

## 2014-08-25 ENCOUNTER — Telehealth: Payer: Self-pay | Admitting: *Deleted

## 2014-08-25 NOTE — Telephone Encounter (Signed)
Pt is calling stephanie back °

## 2014-08-25 NOTE — Telephone Encounter (Signed)
Return call to patient.  Notified of right hip results.  Reviewed recommendations from MyChart message with patient.  She is agreeable with plan.

## 2014-08-25 NOTE — Telephone Encounter (Signed)
Message copied by Graylon Good on Wed Aug 25, 2014 11:59 AM ------      Message from: GRUBB, PATRICIA R      Created: Thu Aug 12, 2014  9:14 AM       Please call her about the BMD.  After looking at the last page -not under discussion.  There was a note about her right hip measurement: T score: right neck was -1.0.  Same other info applies and does not change outcome of plans. ------

## 2014-08-25 NOTE — Telephone Encounter (Signed)
I have attempted to contact this patient by phone with the following results: left message to return my call on answering machine [phone] [facility]).

## 2014-09-07 ENCOUNTER — Ambulatory Visit (INDEPENDENT_AMBULATORY_CARE_PROVIDER_SITE_OTHER): Payer: Federal, State, Local not specified - PPO

## 2014-09-07 DIAGNOSIS — Z23 Encounter for immunization: Secondary | ICD-10-CM

## 2014-09-17 ENCOUNTER — Other Ambulatory Visit: Payer: Self-pay | Admitting: Nurse Practitioner

## 2014-09-17 NOTE — Telephone Encounter (Signed)
[facility]:[id number] 0.3 mg  Last AEX:07/30/14 Last Refill:07/30/14 #30 X 0 Next AEX:08/03/15 Last MMG:08/24/14 Bi-Rads Neg  Please Advise

## 2014-09-26 HISTORY — PX: TRIGGER FINGER RELEASE: SHX641

## 2014-09-27 ENCOUNTER — Encounter: Payer: Self-pay | Admitting: Nurse Practitioner

## 2014-10-01 ENCOUNTER — Encounter: Payer: Self-pay | Admitting: Gastroenterology

## 2014-11-01 ENCOUNTER — Other Ambulatory Visit: Payer: Self-pay

## 2014-11-01 MED ORDER — LEVOTHYROXINE SODIUM 25 MCG PO TABS: ORAL_TABLET | ORAL | Status: DC

## 2014-11-29 ENCOUNTER — Other Ambulatory Visit: Payer: Self-pay

## 2014-11-29 MED ORDER — PANTOPRAZOLE SODIUM 40 MG PO TBEC: DELAYED_RELEASE_TABLET | ORAL | Status: DC

## 2015-01-24 ENCOUNTER — Other Ambulatory Visit: Payer: Self-pay | Admitting: Internal Medicine

## 2015-01-27 ENCOUNTER — Encounter: Payer: Self-pay | Admitting: Internal Medicine

## 2015-01-27 ENCOUNTER — Other Ambulatory Visit (INDEPENDENT_AMBULATORY_CARE_PROVIDER_SITE_OTHER): Payer: Federal, State, Local not specified - PPO

## 2015-01-27 ENCOUNTER — Ambulatory Visit (INDEPENDENT_AMBULATORY_CARE_PROVIDER_SITE_OTHER): Payer: Federal, State, Local not specified - PPO | Admitting: Internal Medicine

## 2015-01-27 ENCOUNTER — Other Ambulatory Visit: Payer: Self-pay | Admitting: Internal Medicine

## 2015-01-27 VITALS — BP 128/84 | HR 79 | Temp 98.4°F | Resp 16 | Ht 63.0 in | Wt 215.1 lb

## 2015-01-27 DIAGNOSIS — R0789 Other chest pain: Secondary | ICD-10-CM

## 2015-01-27 DIAGNOSIS — M791 Myalgia: Secondary | ICD-10-CM

## 2015-01-27 DIAGNOSIS — E785 Hyperlipidemia, unspecified: Secondary | ICD-10-CM

## 2015-01-27 DIAGNOSIS — M609 Myositis, unspecified: Secondary | ICD-10-CM

## 2015-01-27 DIAGNOSIS — IMO0001 Reserved for inherently not codable concepts without codable children: Secondary | ICD-10-CM

## 2015-01-27 DIAGNOSIS — R42 Dizziness and giddiness: Secondary | ICD-10-CM

## 2015-01-27 DIAGNOSIS — E038 Other specified hypothyroidism: Secondary | ICD-10-CM

## 2015-01-27 DIAGNOSIS — Z23 Encounter for immunization: Secondary | ICD-10-CM

## 2015-01-27 LAB — BASIC METABOLIC PANEL
BUN: 21 mg/dL (ref 6–23)
CO2: 30 mEq/L (ref 19–32)
Calcium: 9.8 mg/dL (ref 8.4–10.5)
Chloride: 103 mEq/L (ref 96–112)
Creatinine, Ser: 0.89 mg/dL (ref 0.40–1.20)
GFR: 68.14 mL/min (ref >60.00)
Glucose, Bld: 103 mg/dL — ABNORMAL HIGH (ref 70–99)
POTASSIUM: 4.5 meq/L (ref 3.5–5.1)
Sodium: 138 mEq/L (ref 135–145)

## 2015-01-27 LAB — CBC WITH DIFFERENTIAL/PLATELET
BASOS ABS: 0 10*3/uL (ref 0.0–0.1)
BASOS PCT: 0.5 % (ref 0.0–3.0)
EOS PCT: 2 % (ref 0.0–5.0)
Eosinophils Absolute: 0.1 10*3/uL (ref 0.0–0.7)
HCT: 43.7 % (ref 36.0–46.0)
HEMOGLOBIN: 14.9 g/dL (ref 12.0–15.0)
LYMPHS PCT: 30.3 % (ref 12.0–46.0)
Lymphs Abs: 2.1 10*3/uL (ref 0.7–4.0)
MCHC: 34.1 g/dL (ref 30.0–36.0)
MCV: 87.2 fl (ref 78.0–100.0)
Monocytes Absolute: 0.7 10*3/uL (ref 0.1–1.0)
Monocytes Relative: 10.3 % (ref 3.0–12.0)
Neutro Abs: 3.9 10*3/uL (ref 1.4–7.7)
Neutrophils Relative %: 56.9 % (ref 43.0–77.0)
Platelets: 305 10*3/uL (ref 150.0–400.0)
RBC: 5.01 Mil/uL (ref 3.87–5.11)
RDW: 13.1 % (ref 11.5–15.5)
WBC: 6.8 10*3/uL (ref 4.0–10.5)

## 2015-01-27 LAB — HEPATIC FUNCTION PANEL
ALBUMIN: 4.4 g/dL (ref 3.5–5.2)
ALT: 27 U/L (ref 0–35)
AST: 25 U/L (ref 0–37)
Alkaline Phosphatase: 141 U/L — ABNORMAL HIGH (ref 39–117)
Bilirubin, Direct: 0.1 mg/dL (ref 0.0–0.3)
Total Bilirubin: 0.5 mg/dL (ref 0.2–1.2)
Total Protein: 8.2 g/dL (ref 6.0–8.3)

## 2015-01-27 LAB — CK: Total CK: 85 U/L (ref 7–177)

## 2015-01-27 LAB — TSH: TSH: 5.61 u[IU]/mL — ABNORMAL HIGH (ref 0.35–4.50)

## 2015-01-27 MED ORDER — SIMVASTATIN 40 MG PO TABS: 20.0000 mg | ORAL_TABLET | Freq: Every day | ORAL | Status: DC

## 2015-01-27 NOTE — Progress Notes (Signed)
Pre visit review using our clinic review tool, if applicable. No additional management support is needed unless otherwise documented below in the visit note. 

## 2015-01-27 NOTE — Progress Notes (Signed)
Subjective:    Patient ID: Holly Obrien, female    DOB: 04-30-1951, 64 y.o.   MRN: [id number]  HPI The patient is here to assess status of active health conditions. Current medications taken without adverse effect.  She is on modified heart healthy, low-salt diet.  She walks 2-3 times per week for 30 minutes without cardio pulmonary symptoms.  Occasionally she will have edema mainly in the hands. This is especially when she ingests salt.  She has intermittent sharp chest pain on the left at rest.  She also describes cramps in her legs, upper quadrant, and back intermittently.  The last 3 days she describes some green stool but no melena.  For the last months she's had 3-4 episodes of dizziness associated with blurred vision. It was severe enough she had to pull off the road on one occasion. These lasts seconds and resolve if she rests. These seem to be increased if she breathes through her nose. There is no cardiac or neurologic prodrome prior to these events. They can be related to standing up from a chair or rotating her head. She denies benign positional vertigo symptoms in bed.  She had much more severe symptoms like these roughly 10 years ago manifested as blurred vision and double vision. She was diagnosed as having silent migraines. She saw her ophthalmologist last week. He follows her for dry eyes, early glaucoma and early cataracts. She did not discuss the vision issues with him.    Review of Systems   Denied were any change in heart rhythm or rate prior to the event. There was no associated chest pain or shortness of breath .  Also specifically denied prior to the episode were headache, limb weakness, tingling, or numbness. No seizure activity noted.     Objective:   Physical Exam Gen.: Adequately nourished in appearance. Alert and cooperative throughout exam. BMI: 38.1 Appears younger than stated age  Head: Normocephalic without obvious abnormalities  Eyes: No corneal  or conjunctival inflammation noted. Pupils equal round reactive to light and accommodation. Extraocular motion intact. No nystagmus.There is thinning of eyebrows laterally.  Ears: External  ear exam reveals no significant lesions or deformities. Canals clear .TMs normal. Hearing is grossly normal bilaterally. Nose: External nasal exam reveals no deformity or inflammation. Nasal mucosa are pink and moist. No lesions or exudates noted.   Mouth: Oral mucosa and oropharynx reveal no lesions or exudates. Teeth in good repair. Neck: No deformities, masses, or tenderness noted. Range of motion & Thyroid normal. Lungs: Normal respiratory effort; chest expands symmetrically. Lungs are clear to auscultation without rales, wheezes, or increased work of breathing. Heart: Normal rate and rhythm. Normal S1 and S2. No gallop, click, or rub. No murmur. Abdomen: Bowel sounds normal; abdomen soft and nontender. No masses, organomegaly or hernias noted. Musculoskeletal/extremities: No clubbing, cyanosis, edema, or significant extremity  deformity noted.  Range of motion normal . Tone & strength normal. Hand joints normal  Fingernail  health good. She has crepitus of the knees. Able to lie down & sit up w/o help.  Negative SLR bilaterally Vascular: Carotid, radial artery, dorsalis pedis and  posterior tibial pulses are full and equal. No bruits present. Neurologic: Alert and oriented x3. Deep tendon reflexes symmetrical and normal.  Gait normal   Heel & toe walking .  Rhomberg & finger to nose negative      Skin: Intact without suspicious lesions or rashes. Lymph: No cervical, axillary lymphadenopathy present. Psych: Mood normal but somewhat  flat affect as multiple symptoms described. Normally interactive                                                                                     Assessment & Plan:  #1 paroxysmal dizziness  #2 atypical chest pain #3 see Problem Lst with plan

## 2015-01-27 NOTE — Assessment & Plan Note (Signed)
TSH 

## 2015-01-27 NOTE — Assessment & Plan Note (Signed)
NMR Lipoprofile, LFT, TSH, CK 

## 2015-01-27 NOTE — Patient Instructions (Signed)
  Your next office appointment will be determined based upon review of your pending labs  Those instructions will be transmitted to you through My Chart  Critical values will be called.   Followup as needed for any active or acute issue. Please report any significant change in your symptoms.  Stress test may be considered.

## 2015-01-27 NOTE — Assessment & Plan Note (Addendum)
Vitamin D level monitored by Gyn ; last 6/15

## 2015-01-28 ENCOUNTER — Other Ambulatory Visit: Payer: Self-pay | Admitting: Internal Medicine

## 2015-01-28 ENCOUNTER — Telehealth: Payer: Self-pay

## 2015-01-28 ENCOUNTER — Other Ambulatory Visit (INDEPENDENT_AMBULATORY_CARE_PROVIDER_SITE_OTHER): Payer: Federal, State, Local not specified - PPO

## 2015-01-28 DIAGNOSIS — E038 Other specified hypothyroidism: Secondary | ICD-10-CM

## 2015-01-28 DIAGNOSIS — R748 Abnormal levels of other serum enzymes: Secondary | ICD-10-CM | POA: Insufficient documentation

## 2015-01-28 DIAGNOSIS — R7309 Other abnormal glucose: Secondary | ICD-10-CM

## 2015-01-28 LAB — HEMOGLOBIN A1C: Hgb A1c MFr Bld: 6.1 % (ref 4.6–6.5)

## 2015-01-28 NOTE — Telephone Encounter (Signed)
Request for add on has been faxed to lab 

## 2015-01-28 NOTE — Telephone Encounter (Signed)
-----   Message from Hendricks Limes, MD sent at 01/28/2015  5:58 AM EST ----- Please add A1c (R73.9)

## 2015-01-29 LAB — NMR LIPOPROFILE WITH LIPIDS
Cholesterol, Total: 158 mg/dL (ref 100–199)
HDL Particle Number: 37.2 umol/L (ref >=30.5)
HDL Size: 8.8 nm — ABNORMAL LOW (ref >=9.2)
HDL-C: 50 mg/dL (ref >39)
LARGE HDL: 4 umol/L — AB (ref >=4.8)
LARGE VLDL-P: 4 nmol/L — AB (ref <=2.7)
LDL CALC: 86 mg/dL (ref 0–99)
LDL Particle Number: 1427 nmol/L — ABNORMAL HIGH (ref <1000)
LDL Size: 20.7 nm (ref >=20.8)
LP-IR SCORE: 64 — AB (ref <=45)
SMALL LDL PARTICLE NUMBER: 772 nmol/L — AB (ref <=527)
Triglycerides: 110 mg/dL (ref 0–149)
VLDL Size: 48.8 nm — ABNORMAL HIGH (ref <=46.6)

## 2015-05-25 ENCOUNTER — Other Ambulatory Visit: Payer: Self-pay | Admitting: Internal Medicine

## 2015-05-26 ENCOUNTER — Other Ambulatory Visit: Payer: Self-pay

## 2015-05-26 MED ORDER — PANTOPRAZOLE SODIUM 40 MG PO TBEC: DELAYED_RELEASE_TABLET | ORAL | Status: DC

## 2015-05-26 NOTE — Telephone Encounter (Signed)
protonix rx sent to pharm 

## 2015-05-31 ENCOUNTER — Telehealth: Payer: Self-pay | Admitting: Emergency Medicine

## 2015-05-31 NOTE — Telephone Encounter (Signed)
PA for Pantoprazole 40mg  approved until 05/30/16

## 2015-07-12 ENCOUNTER — Other Ambulatory Visit: Payer: Self-pay | Admitting: Emergency Medicine

## 2015-07-12 MED ORDER — ALBUTEROL SULFATE HFA 108 (90 BASE) MCG/ACT IN AERS: INHALATION_SPRAY | RESPIRATORY_TRACT | Status: DC

## 2015-08-03 ENCOUNTER — Encounter: Payer: Self-pay | Admitting: Nurse Practitioner

## 2015-08-03 ENCOUNTER — Ambulatory Visit (INDEPENDENT_AMBULATORY_CARE_PROVIDER_SITE_OTHER): Payer: Federal, State, Local not specified - PPO | Admitting: Nurse Practitioner

## 2015-08-03 VITALS — BP 120/84 | HR 74 | Resp 14 | Ht 62.75 in | Wt 219.0 lb

## 2015-08-03 DIAGNOSIS — Z1211 Encounter for screening for malignant neoplasm of colon: Secondary | ICD-10-CM

## 2015-08-03 DIAGNOSIS — Z79890 Hormone replacement therapy (postmenopausal): Secondary | ICD-10-CM | POA: Diagnosis not present

## 2015-08-03 DIAGNOSIS — Z01419 Encounter for gynecological examination (general) (routine) without abnormal findings: Secondary | ICD-10-CM | POA: Diagnosis not present

## 2015-08-03 MED ORDER — ESTROGENS CONJUGATED 0.3 MG PO TABS: ORAL_TABLET | ORAL | Status: DC

## 2015-08-03 NOTE — Patient Instructions (Signed)

## 2015-08-03 NOTE — Progress Notes (Signed)
64 y.o. [id number] Married  Caucasian Fe here for annual exam.  Feels well, no increase in vaginal dryness.  Still wants to continue ERT.  She will try again to taper this fall but did not do well with that over past few years - she willing to try again.  Patient's last menstrual period was 05/01/1987.          Sexually active: No.  The current method of family planning is none.    Exercising: Yes.    walking Smoker:  no  Health Maintenance: Pap:  04/23/01, Negative MMG:  08/24/14 3D Dense Category c, Bi-Rads 1:  Negative  Colonoscopy:  12/2006 diverticulitis recheck in 5 years-[facility]. TDaP:  PCP updates Labs: PCP   reports that she has never smoked. She has never used smokeless tobacco. She reports that she does not drink alcohol or use illicit drugs.  Past Medical History  Diagnosis Date  . Asthma     with URI's  . Thyroid disease     hypothyroidism  . Hyperlipidemia     NMR 2007  . Esophagitis     HH/ERD    Past Surgical History  Procedure Laterality Date  . Pubovaginal sling  1997    Dr Karsten Ro  . Carpal tunnel release  06/18/00    right wrist -- Dr Tamala Fothergill  . Finger surgery  12/10/06    left index finger injury--Dr Theodis Sato  . Trigger finger release Right 01/2014  . Trigger finger release Left 09/2014  . Oophorectomy  09/11/94, 01/14/96    1995-left ovary removed. 1997 Right ovary removed--Dr Molli Hazard  . Abdominal hysterectomy  04/30/88    Hysterectomy and bladder tact-- Dr Molli Hazard    Current Outpatient Prescriptions  Medication Sig Dispense Refill  . albuterol (VENTOLIN HFA) 108 (90 BASE) MCG/ACT inhaler 1-2 puffs q 4 hrs prn 18 g 2  . aspirin 81 MG tablet Take 81 mg by mouth daily.    . B Complex Vitamins (B-COMPLEX/B-12 PO) Take 1,000 mcg by mouth every morning.     . Calcium Carb-Cholecalciferol (CALCIUM 600/VITAMIN D3) 600-800 MG-UNIT TABS Take 1 tablet by mouth daily.    . Cholecalciferol (VITAMIN D3) 2000 UNITS TABS Take 1 tablet by mouth daily.     . Coenzyme Q10 200 MG capsule Take 200 mg by mouth daily.    Marland Kitchen estrogens, conjugated, (PREMARIN) 0.3 MG tablet Take daily 90 tablet 3  . fluticasone (FLONASE) 50 MCG/ACT nasal spray 1 spray in each nostril twice a day as needed. Use the "crossover" technique as discussed 16 g 11  . Fluticasone-Salmeterol (ADVAIR DISKUS) 250-50 MCG/DOSE AEPB Inhale 1 puff into the lungs 2 (two) times daily as needed.     . latanoprost (XALATAN) 0.005 % ophthalmic solution Place 0.005 drops into both eyes at bedtime and may repeat dose one time if needed.  3  . levothyroxine (SYNTHROID, LEVOTHROID) 25 MCG tablet 1&1/2 every day 108 tablet 1  . Magnesium 500 MG CAPS Take 1 capsule by mouth daily.    . Multiple Vitamins-Minerals (ECHINACEA ACZ PO) Take 1 tablet by mouth daily.      . pantoprazole (PROTONIX) 40 MG tablet TAKE 1 TABLET BY MOUTH EVERY DAY 90 tablet 1  . simvastatin (ZOCOR) 40 MG tablet Take 0.5 tablets (20 mg total) by mouth at bedtime. 45 tablet 1  . zinc gluconate 50 MG tablet Take 50 mg by mouth daily.     No current facility-administered medications for this visit.  Family History  Problem Relation Age of Onset  . Arthritis Other   . Depression Other   . Thyroid disease Other     hyperthyroidism  . Stroke Other   . Heart disease Other     CABG  . Diabetes Other     hypoglycemia  . Osteoarthritis Mother   . Hyperlipidemia Mother   . Hypertension Mother   . Thyroid disease Mother   . Heart failure Father   . Cancer Father   . Heart failure Brother   . Hypertension Brother   . Hypertension Sister     ROS:  Pertinent items are noted in HPI.  Otherwise, a comprehensive ROS was negative.  Exam:   BP 120/84 mmHg  Pulse 74  Resp 14  Ht 5' 2.75" (1.594 m)  Wt 219 lb (99.338 kg)  BMI 39.10 kg/m2  LMP 05/01/1987 Height: 5' 2.75" (159.4 cm) Ht Readings from Last 3 Encounters:  08/03/15 5' 2.75" (1.594 m)  01/27/15 5\' 3"  (1.6 m)  07/30/14 5\' 3"  (1.6 m)    General appearance:  alert, cooperative and appears stated age Head: Normocephalic, without obvious abnormality, atraumatic Neck: no adenopathy, supple, symmetrical, trachea midline and thyroid normal to inspection and palpation Lungs: clear to auscultation bilaterally Breasts: normal appearance, no masses or tenderness Heart: regular rate and rhythm Abdomen: soft, non-tender; no masses,  no organomegaly Extremities: extremities normal, atraumatic, no cyanosis or edema Skin: Skin color, texture, turgor normal. No rashes or lesions Lymph nodes: Cervical, supraclavicular, and axillary nodes normal. No abnormal inguinal nodes palpated Neurologic: Grossly normal   Pelvic: External genitalia:  no lesions              Urethra:  normal appearing urethra with no masses, tenderness or lesions              Bartholin's and Skene's: normal                 Vagina: normal appearing vagina with normal color and discharge, no lesions              Cervix: absent              Pap taken: No. Bimanual Exam:  Uterus:  uterus absent              Adnexa: no mass, fullness, tenderness               Rectovaginal: Confirms               Anus:  normal sphincter tone, no lesions  Chaperone present: no  A:  Well Woman with normal exam  S/P TAH 1989, later [place] and sling  History of breast cyst History of hypothyroid History of hypercholesterolemia, GERD  Increase in weight secondary to decrease exercise and right hip pain  P:   Reviewed health and wellness pertinent to exam  Pap smear as above  Mammogram is due 9/16  She will do IFOB - but declines apt to see GI  She is given a refill on Premarin 0.3 mg daily and taper as discussed  Counseled about DVT, CVA, cancer, etc  Counseled on breast self exam, mammography screening, use and side effects of HRT, adequate intake of calcium and vitamin D, diet and exercise, Kegel's exercises return annually or prn  An After Visit Summary  was printed and given to the patient.

## 2015-08-05 NOTE — Progress Notes (Signed)
Encounter reviewed by Dr. Brook Amundson C. Silva.  

## 2015-08-22 ENCOUNTER — Other Ambulatory Visit: Payer: Self-pay | Admitting: Internal Medicine

## 2015-09-01 LAB — FECAL OCCULT BLOOD, IMMUNOCHEMICAL: IMMUNOLOGICAL FECAL OCCULT BLOOD TEST: NEGATIVE

## 2015-09-01 NOTE — Addendum Note (Signed)
Addended by: Susanne Greenhouse E on: 09/01/2015 11:07 AM   Modules accepted: Orders

## 2015-09-01 NOTE — Addendum Note (Signed)
Addended by: Abelino Derrick C on: 09/01/2015 12:57 PM   Modules accepted: Miquel Dunn

## 2015-09-02 ENCOUNTER — Ambulatory Visit (INDEPENDENT_AMBULATORY_CARE_PROVIDER_SITE_OTHER): Payer: Federal, State, Local not specified - PPO

## 2015-09-02 DIAGNOSIS — Z23 Encounter for immunization: Secondary | ICD-10-CM

## 2015-09-21 ENCOUNTER — Other Ambulatory Visit: Payer: Self-pay

## 2015-09-21 MED ORDER — FLUTICASONE-SALMETEROL 250-50 MCG/DOSE IN AEPB: 1.0000 | INHALATION_SPRAY | Freq: Two times a day (BID) | RESPIRATORY_TRACT | Status: DC | PRN

## 2015-09-22 ENCOUNTER — Telehealth: Payer: Self-pay | Admitting: *Deleted

## 2015-09-22 DIAGNOSIS — J310 Chronic rhinitis: Secondary | ICD-10-CM

## 2015-09-22 MED ORDER — ALBUTEROL SULFATE HFA 108 (90 BASE) MCG/ACT IN AERS: INHALATION_SPRAY | RESPIRATORY_TRACT | Status: DC

## 2015-09-22 MED ORDER — FLUTICASONE PROPIONATE 50 MCG/ACT NA SUSP: NASAL | Status: DC

## 2015-09-22 NOTE — Telephone Encounter (Signed)
Receive call pt states she is needing refill on her flonase. Pharmacy sent request on Monday & still haven't receive response. Inform pt per chart no requuest has came through electronically, but will refill and [facility] to [facility].../lmb

## 2015-11-02 ENCOUNTER — Other Ambulatory Visit: Payer: Self-pay | Admitting: Internal Medicine

## 2015-11-07 ENCOUNTER — Other Ambulatory Visit: Payer: Self-pay

## 2015-11-07 ENCOUNTER — Telehealth: Payer: Self-pay | Admitting: *Deleted

## 2015-11-07 ENCOUNTER — Other Ambulatory Visit (INDEPENDENT_AMBULATORY_CARE_PROVIDER_SITE_OTHER): Payer: Federal, State, Local not specified - PPO

## 2015-11-07 DIAGNOSIS — E038 Other specified hypothyroidism: Secondary | ICD-10-CM

## 2015-11-07 DIAGNOSIS — R748 Abnormal levels of other serum enzymes: Secondary | ICD-10-CM

## 2015-11-07 DIAGNOSIS — E785 Hyperlipidemia, unspecified: Secondary | ICD-10-CM

## 2015-11-07 DIAGNOSIS — E039 Hypothyroidism, unspecified: Secondary | ICD-10-CM

## 2015-11-07 LAB — TSH: TSH: 6.33 u[IU]/mL — AB (ref 0.35–4.50)

## 2015-11-07 LAB — GAMMA GT: GGT: 25 U/L (ref 7–51)

## 2015-11-07 MED ORDER — SIMVASTATIN 40 MG PO TABS: 20.0000 mg | ORAL_TABLET | Freq: Every day | ORAL | Status: DC

## 2015-11-07 NOTE — Telephone Encounter (Signed)
Received call pt stated she forgot to come in to have her TSH recheck. Inform her per chart lab is still system can come today to have done, also she states she is needing refill on her simvastatin. Inform pt will [facility].../lmb

## 2015-11-12 ENCOUNTER — Other Ambulatory Visit: Payer: Self-pay | Admitting: Internal Medicine

## 2016-01-28 ENCOUNTER — Other Ambulatory Visit: Payer: Self-pay | Admitting: Internal Medicine

## 2016-01-30 ENCOUNTER — Ambulatory Visit (INDEPENDENT_AMBULATORY_CARE_PROVIDER_SITE_OTHER): Payer: Federal, State, Local not specified - PPO | Admitting: Internal Medicine

## 2016-01-30 ENCOUNTER — Encounter: Payer: Self-pay | Admitting: Internal Medicine

## 2016-01-30 ENCOUNTER — Other Ambulatory Visit (INDEPENDENT_AMBULATORY_CARE_PROVIDER_SITE_OTHER): Payer: Federal, State, Local not specified - PPO

## 2016-01-30 VITALS — BP 136/82 | HR 76 | Temp 98.2°F | Resp 14 | Ht 63.0 in | Wt 222.4 lb

## 2016-01-30 DIAGNOSIS — Z418 Encounter for other procedures for purposes other than remedying health state: Secondary | ICD-10-CM | POA: Diagnosis not present

## 2016-01-30 DIAGNOSIS — J4520 Mild intermittent asthma, uncomplicated: Secondary | ICD-10-CM | POA: Diagnosis not present

## 2016-01-30 DIAGNOSIS — Z0000 Encounter for general adult medical examination without abnormal findings: Secondary | ICD-10-CM | POA: Diagnosis not present

## 2016-01-30 DIAGNOSIS — Z23 Encounter for immunization: Secondary | ICD-10-CM

## 2016-01-30 DIAGNOSIS — E038 Other specified hypothyroidism: Secondary | ICD-10-CM | POA: Diagnosis not present

## 2016-01-30 DIAGNOSIS — Z299 Encounter for prophylactic measures, unspecified: Secondary | ICD-10-CM

## 2016-01-30 LAB — COMPREHENSIVE METABOLIC PANEL
ALBUMIN: 4.4 g/dL (ref 3.5–5.2)
ALT: 28 U/L (ref 0–35)
AST: 28 U/L (ref 0–37)
Alkaline Phosphatase: 119 U/L — ABNORMAL HIGH (ref 39–117)
BILIRUBIN TOTAL: 0.6 mg/dL (ref 0.2–1.2)
BUN: 19 mg/dL (ref 6–23)
CHLORIDE: 104 meq/L (ref 96–112)
CO2: 29 meq/L (ref 19–32)
Calcium: 9.9 mg/dL (ref 8.4–10.5)
Creatinine, Ser: 0.76 mg/dL (ref 0.40–1.20)
GFR: 81.5 mL/min (ref >60.00)
GLUCOSE: 95 mg/dL (ref 70–99)
POTASSIUM: 4.3 meq/L (ref 3.5–5.1)
SODIUM: 143 meq/L (ref 135–145)
Total Protein: 7.8 g/dL (ref 6.0–8.3)

## 2016-01-30 LAB — LIPID PANEL
CHOL/HDL RATIO: 4
Cholesterol: 154 mg/dL (ref 0–200)
HDL: 41.5 mg/dL (ref >39.00)
LDL CALC: 89 mg/dL (ref 0–99)
NONHDL: 112.61
TRIGLYCERIDES: 117 mg/dL (ref 0.0–149.0)
VLDL: 23.4 mg/dL (ref 0.0–40.0)

## 2016-01-30 LAB — HEMOGLOBIN A1C: Hgb A1c MFr Bld: 6.1 % (ref 4.6–6.5)

## 2016-01-30 LAB — TSH: TSH: 6.09 u[IU]/mL — AB (ref 0.35–4.50)

## 2016-01-30 LAB — T4, FREE: Free T4: 0.9 ng/dL (ref 0.60–1.60)

## 2016-01-30 LAB — HEPATITIS C ANTIBODY: HCV Ab: NEGATIVE

## 2016-01-30 NOTE — Telephone Encounter (Signed)
Patient has appt with dr Sharlet Salina today (01/30/16)---please advise, thanks

## 2016-01-30 NOTE — Telephone Encounter (Signed)
Will address at visit so denying.

## 2016-01-30 NOTE — Progress Notes (Signed)
Pre visit review using our clinic review tool, if applicable. No additional management support is needed unless otherwise documented below in the visit note. 

## 2016-01-30 NOTE — Patient Instructions (Addendum)
We have given you the shingles today and will check the blood work.   Once we have the results of the thyroid we will send in the thyroid medicine. It may be easier to change it to the next higher strength that you take daily instead of worrying about 1.5 pills some of the time.   For the vision changes it would be worthwhile to check a scan of the brain if you are interested to make sure there is nothing that we are missing.   Health Maintenance, Female Adopting a healthy lifestyle and getting preventive care can go a long way to promote health and wellness. Talk with your health care provider about what schedule of regular examinations is right for you. This is a good chance for you to check in with your provider about disease prevention and staying healthy. In between checkups, there are plenty of things you can do on your own. Experts have done a lot of research about which lifestyle changes and preventive measures are most likely to keep you healthy. Ask your health care provider for more information. WEIGHT AND DIET  Eat a healthy diet  Be sure to include plenty of vegetables, fruits, low-fat dairy products, and lean protein.  Do not eat a lot of foods high in solid fats, added sugars, or salt.  Get regular exercise. This is one of the most important things you can do for your health.  Most adults should exercise for at least 150 minutes each week. The exercise should increase your heart rate and make you sweat (moderate-intensity exercise).  Most adults should also do strengthening exercises at least twice a week. This is in addition to the moderate-intensity exercise.  Maintain a healthy weight  Body mass index (BMI) is a measurement that can be used to identify possible weight problems. It estimates body fat based on height and weight. Your health care provider can help determine your BMI and help you achieve or maintain a healthy weight.  For females 13 years of age and older:    A BMI below 18.5 is considered underweight.  A BMI of 18.5 to 24.9 is normal.  A BMI of 25 to 29.9 is considered overweight.  A BMI of 30 and above is considered obese.  Watch levels of cholesterol and blood lipids  You should start having your blood tested for lipids and cholesterol at 64 years of age, then have this test every 5 years.  You may need to have your cholesterol levels checked more often if:  Your lipid or cholesterol levels are high.  You are older than 64 years of age.  You are at high risk for heart disease.  CANCER SCREENING   Lung Cancer  Lung cancer screening is recommended for adults 110-32 years old who are at high risk for lung cancer because of a history of smoking.  A yearly low-dose CT scan of the lungs is recommended for people who:  Currently smoke.  Have quit within the past 15 years.  Have at least a 30-pack-year history of smoking. A pack year is smoking an average of one pack of cigarettes a day for 1 year.  Yearly screening should continue until it has been 15 years since you quit.  Yearly screening should stop if you develop a health problem that would prevent you from having lung cancer treatment.  Breast Cancer  Practice breast self-awareness. This means understanding how your breasts normally appear and feel.  It also means doing regular breast  self-exams. Let your health care provider know about any changes, no matter how small.  If you are in your 20s or 30s, you should have a clinical breast exam (CBE) by a health care provider every 1-3 years as part of a regular health exam.  If you are 75 or older, have a CBE every year. Also consider having a breast X-ray (mammogram) every year.  If you have a family history of breast cancer, talk to your health care provider about genetic screening.  If you are at high risk for breast cancer, talk to your health care provider about having an MRI and a mammogram every year.  Breast  cancer gene (BRCA) assessment is recommended for women who have family members with BRCA-related cancers. BRCA-related cancers include:  Breast.  Ovarian.  Tubal.  Peritoneal cancers.  Results of the assessment will determine the need for genetic counseling and BRCA1 and BRCA2 testing. Cervical Cancer Your health care provider may recommend that you be screened regularly for cancer of the pelvic organs (ovaries, uterus, and vagina). This screening involves a pelvic examination, including checking for microscopic changes to the surface of your cervix (Pap test). You may be encouraged to have this screening done every 3 years, beginning at age 48.  For women ages 53-65, health care providers may recommend pelvic exams and Pap testing every 3 years, or they may recommend the Pap and pelvic exam, combined with testing for human papilloma virus (HPV), every 5 years. Some types of HPV increase your risk of cervical cancer. Testing for HPV may also be done on women of any age with unclear Pap test results.  Other health care providers may not recommend any screening for nonpregnant women who are considered low risk for pelvic cancer and who do not have symptoms. Ask your health care provider if a screening pelvic exam is right for you.  If you have had past treatment for cervical cancer or a condition that could lead to cancer, you need Pap tests and screening for cancer for at least 20 years after your treatment. If Pap tests have been discontinued, your risk factors (such as having a new sexual partner) need to be reassessed to determine if screening should resume. Some women have medical problems that increase the chance of getting cervical cancer. In these cases, your health care provider may recommend more frequent screening and Pap tests. Colorectal Cancer  This type of cancer can be detected and often prevented.  Routine colorectal cancer screening usually begins at 64 years of age and  continues through 64 years of age.  Your health care provider may recommend screening at an earlier age if you have risk factors for colon cancer.  Your health care provider may also recommend using home test kits to check for hidden blood in the stool.  A small camera at the end of a tube can be used to examine your colon directly (sigmoidoscopy or colonoscopy). This is done to check for the earliest forms of colorectal cancer.  Routine screening usually begins at age 64.  Direct examination of the colon should be repeated every 5-10 years through 64 years of age. However, you may need to be screened more often if early forms of precancerous polyps or small growths are found. Skin Cancer  Check your skin from head to toe regularly.  Tell your health care provider about any new moles or changes in moles, especially if there is a change in a mole's shape or color.  Also  tell your health care provider if you have a mole that is larger than the size of a pencil eraser.  Always use sunscreen. Apply sunscreen liberally and repeatedly throughout the day.  Protect yourself by wearing long sleeves, pants, a wide-brimmed hat, and sunglasses whenever you are outside. HEART DISEASE, DIABETES, AND HIGH BLOOD PRESSURE   High blood pressure causes heart disease and increases the risk of stroke. High blood pressure is more likely to develop in:  People who have blood pressure in the high end of the normal range (130-139/85-89 mm Hg).  People who are overweight or obese.  People who are African American.  If you are 53-53 years of age, have your blood pressure checked every 3-5 years. If you are 71 years of age or older, have your blood pressure checked every year. You should have your blood pressure measured twice--once when you are at a hospital or clinic, and once when you are not at a hospital or clinic. Record the average of the two measurements. To check your blood pressure when you are not at  a hospital or clinic, you can use:  An automated blood pressure machine at a pharmacy.  A home blood pressure monitor.  If you are between 57 years and 86 years old, ask your health care provider if you should take aspirin to prevent strokes.  Have regular diabetes screenings. This involves taking a blood sample to check your fasting blood sugar level.  If you are at a normal weight and have a low risk for diabetes, have this test once every three years after 64 years of age.  If you are overweight and have a high risk for diabetes, consider being tested at a younger age or more often. PREVENTING INFECTION  Hepatitis B  If you have a higher risk for hepatitis B, you should be screened for this virus. You are considered at high risk for hepatitis B if:  You were born in a country where hepatitis B is common. Ask your health care provider which countries are considered high risk.  Your parents were born in a high-risk country, and you have not been immunized against hepatitis B (hepatitis B vaccine).  You have HIV or AIDS.  You use needles to inject street drugs.  You live with someone who has hepatitis B.  You have had sex with someone who has hepatitis B.  You get hemodialysis treatment.  You take certain medicines for conditions, including cancer, organ transplantation, and autoimmune conditions. Hepatitis C  Blood testing is recommended for:  Everyone born from [birth year] through 1964.  Anyone with known risk factors for hepatitis C. Sexually transmitted infections (STIs)  You should be screened for sexually transmitted infections (STIs) including gonorrhea and chlamydia if:  You are sexually active and are younger than 64 years of age.  You are older than 64 years of age and your health care provider tells you that you are at risk for this type of infection.  Your sexual activity has changed since you were last screened and you are at an increased risk for chlamydia or  gonorrhea. Ask your health care provider if you are at risk.  If you do not have HIV, but are at risk, it may be recommended that you take a prescription medicine daily to prevent HIV infection. This is called pre-exposure prophylaxis (PrEP). You are considered at risk if:  You are sexually active and do not regularly use condoms or know the HIV status of your partner(s).  You take drugs by injection.  You are sexually active with a partner who has HIV. Talk with your health care provider about whether you are at high risk of being infected with HIV. If you choose to begin PrEP, you should first be tested for HIV. You should then be tested every 3 months for as long as you are taking PrEP.  PREGNANCY   If you are premenopausal and you may become pregnant, ask your health care provider about preconception counseling.  If you may become pregnant, take 400 to 800 micrograms (mcg) of folic acid every day.  If you want to prevent pregnancy, talk to your health care provider about birth control (contraception). OSTEOPOROSIS AND MENOPAUSE   Osteoporosis is a disease in which the bones lose minerals and strength with aging. This can result in serious bone fractures. Your risk for osteoporosis can be identified using a bone density scan.  If you are 72 years of age or older, or if you are at risk for osteoporosis and fractures, ask your health care provider if you should be screened.  Ask your health care provider whether you should take a calcium or vitamin D supplement to lower your risk for osteoporosis.  Menopause may have certain physical symptoms and risks.  Hormone replacement therapy may reduce some of these symptoms and risks. Talk to your health care provider about whether hormone replacement therapy is right for you.  HOME CARE INSTRUCTIONS   Schedule regular health, dental, and eye exams.  Stay current with your immunizations.   Do not use any tobacco products including  cigarettes, chewing tobacco, or electronic cigarettes.  If you are pregnant, do not drink alcohol.  If you are breastfeeding, limit how much and how often you drink alcohol.  Limit alcohol intake to no more than 1 drink per day for nonpregnant women. One drink equals 12 ounces of beer, 5 ounces of wine, or 1 ounces of hard liquor.  Do not use street drugs.  Do not share needles.  Ask your health care provider for help if you need support or information about quitting drugs.  Tell your health care provider if you often feel depressed.  Tell your health care provider if you have ever been abused or do not feel safe at home.   This information is not intended to replace advice given to you by your health care provider. Make sure you discuss any questions you have with your health care provider.   Document Released: 05/28/2011 Document Revised: 12/03/2014 Document Reviewed: 10/14/2013 Elsevier Interactive Patient Education [employer].

## 2016-01-31 ENCOUNTER — Telehealth: Payer: Self-pay | Admitting: Internal Medicine

## 2016-01-31 DIAGNOSIS — E785 Hyperlipidemia, unspecified: Secondary | ICD-10-CM

## 2016-01-31 DIAGNOSIS — E038 Other specified hypothyroidism: Secondary | ICD-10-CM

## 2016-01-31 DIAGNOSIS — Z0000 Encounter for general adult medical examination without abnormal findings: Secondary | ICD-10-CM | POA: Insufficient documentation

## 2016-01-31 NOTE — Progress Notes (Signed)
   Subjective:    Patient ID: Holly Obrien, female    DOB: 13-Jun-1951, 65 y.o.   MRN: [id number]  HPI The patient is a 65 YO female coming in for wellness. No new concerns today but wants to talk about her medical problems.   PMH, [facility], social history reviewed and updated.   Review of Systems  Constitutional: Negative for fever, activity change, appetite change, fatigue and unexpected weight change.  HENT: Negative.   Eyes: Negative.   Respiratory: Negative for cough, chest tightness, shortness of breath and wheezing.   Cardiovascular: Negative for chest pain, palpitations and leg swelling.  Gastrointestinal: Negative for nausea, abdominal pain, diarrhea, constipation and abdominal distention.  Musculoskeletal: Negative.   Skin: Negative.   Neurological: Negative.   Psychiatric/Behavioral: Negative.       Objective:   Physical Exam  Constitutional: She is oriented to person, place, and time. She appears well-developed and well-nourished.  HENT:  Head: Normocephalic and atraumatic.  Eyes: EOM are normal.  Neck: Normal range of motion.  Cardiovascular: Normal rate and regular rhythm.   Pulmonary/Chest: Effort normal and breath sounds normal. No respiratory distress. She has no wheezes. She has no rales.  Abdominal: Soft. Bowel sounds are normal. She exhibits no distension. There is no tenderness. There is no rebound.  Musculoskeletal: She exhibits no edema.  Neurological: She is alert and oriented to person, place, and time. Coordination normal.  Skin: Skin is warm and dry.  Psychiatric: She has a normal mood and affect.   Filed Vitals:   01/30/16 0930  BP: 136/82  Pulse: 76  Temp: 98.2 F (36.8 C)  TempSrc: Oral  Resp: 14  Height: 5\' 3"  (1.6 m)  Weight: 222 lb 6.4 oz (100.88 kg)  SpO2: 96%      Assessment & Plan:  Shingles shot given at visit.

## 2016-01-31 NOTE — Assessment & Plan Note (Signed)
Doing well on advair and albuterol. No recent flare. If still no flare at next visit may want to try step down to lower dose advair. We discussed the difference between rescue and maintenance.

## 2016-01-31 NOTE — Assessment & Plan Note (Signed)
Last TSH high and she is on regimen of 1 pill several days per week and 1.5 pills several days per week of her 25 mcg tablet. If TSH still high will increase to 50 mcg daily to simplify regimen.

## 2016-01-31 NOTE — Assessment & Plan Note (Signed)
Shingles shot given to update immunizations. Checking labs including hep c screening. Discussed exercise as a way to increase her health. Given recommendations for screening and health. Discussed mole surveillance and sun safety.

## 2016-02-01 ENCOUNTER — Other Ambulatory Visit: Payer: Self-pay | Admitting: Geriatric Medicine

## 2016-02-01 MED ORDER — LEVOTHYROXINE SODIUM 50 MCG PO TABS: 50.0000 ug | ORAL_TABLET | Freq: Every day | ORAL | Status: DC

## 2016-02-01 MED ORDER — SIMVASTATIN 20 MG PO TABS: 20.0000 mg | ORAL_TABLET | Freq: Every day | ORAL | Status: DC

## 2016-02-01 NOTE — Telephone Encounter (Signed)
Sent in refills, changed cholesterol medicine to lower strength so she can take 1 pill instead of 1/2 daily. Changed thyroid medicine to next higher strength and she will take 1 pill daily to simplify regimen.

## 2016-02-01 NOTE — Telephone Encounter (Signed)
Left message informing patient.

## 2016-02-01 NOTE — Addendum Note (Signed)
Addended by: Pricilla Holm A on: 02/01/2016 11:08 AM   Modules accepted: Orders

## 2016-02-02 ENCOUNTER — Telehealth: Payer: Self-pay

## 2016-02-02 ENCOUNTER — Telehealth: Payer: Self-pay | Admitting: Internal Medicine

## 2016-02-02 NOTE — Telephone Encounter (Signed)
Pt called again.  Can you call her as soon has you get a chance?

## 2016-02-02 NOTE — Telephone Encounter (Signed)
Patient called to advise that she is having a reaction to the zostvax given. States that there is a -4 in area at the injection site that has welped. It stings and has prickly pain intermittently. Also feverish. She showed the pharmacist yesterday, and he described it as "fairly extreme". Please advise on how to proceed.

## 2016-02-02 NOTE — Telephone Encounter (Signed)
Called patient. No answer. Left message for patient to call and schedule a nurse visit today and have someone look at the arm. This was per Dr. Sharlet Salina.

## 2016-02-02 NOTE — Telephone Encounter (Signed)
The scheduler  Requested phone fup for patient of Dr. Sharlet Salina with c/o of redness and swelling at site of zostavax given on 03/06, States the site turned red and swollen yesterday approximately; 2" redness and states today it is 5 " with the the most redness noted in the 2" diameter seen yesterday. The patient states she can take benadryl; recommended she try this and reviewed the list of SE given by the manufacturer.  injection site reactions (redness, itching, swelling, warmth, bruising, and pain), headache, diarrhea, joint or muscle pain, or skin rash.   Get medical attention for:  fever, swollen glands, sore throat, flu symptoms; breathing problems; or severe or painful skin rash or any other s/s that is uncomfortable or worrisome to her.   She can go to urgent walk in clinic or to ER if the site appears to be getting worse or other side effects develop; decreased in sensation; discoloration etc. She can also ask the pharmacist to inspect the site as the these vaccinations are given at pharmacies as well.

## 2016-02-03 NOTE — Telephone Encounter (Signed)
Call to Holly Obrien who stated she was feeling better on benedryl and that it had helped. Still red but symptoms relieved.

## 2016-02-18 ENCOUNTER — Other Ambulatory Visit: Payer: Self-pay | Admitting: Internal Medicine

## 2016-08-02 ENCOUNTER — Encounter: Payer: Self-pay | Admitting: Internal Medicine

## 2016-08-02 ENCOUNTER — Ambulatory Visit (INDEPENDENT_AMBULATORY_CARE_PROVIDER_SITE_OTHER): Payer: Federal, State, Local not specified - PPO | Admitting: Internal Medicine

## 2016-08-02 ENCOUNTER — Other Ambulatory Visit (INDEPENDENT_AMBULATORY_CARE_PROVIDER_SITE_OTHER): Payer: Federal, State, Local not specified - PPO

## 2016-08-02 VITALS — BP 148/90 | HR 103 | Temp 98.6°F | Resp 14 | Ht 63.5 in | Wt 222.1 lb

## 2016-08-02 DIAGNOSIS — Z23 Encounter for immunization: Secondary | ICD-10-CM

## 2016-08-02 DIAGNOSIS — R531 Weakness: Secondary | ICD-10-CM

## 2016-08-02 DIAGNOSIS — M6281 Muscle weakness (generalized): Secondary | ICD-10-CM

## 2016-08-02 LAB — CBC
HEMATOCRIT: 41.7 % (ref 36.0–46.0)
HEMOGLOBIN: 14 g/dL (ref 12.0–15.0)
MCHC: 33.6 g/dL (ref 30.0–36.0)
MCV: 86.9 fl (ref 78.0–100.0)
PLATELETS: 304 10*3/uL (ref 150.0–400.0)
RBC: 4.8 Mil/uL (ref 3.87–5.11)
RDW: 13.8 % (ref 11.5–15.5)
WBC: 8.5 10*3/uL (ref 4.0–10.5)

## 2016-08-02 LAB — COMPREHENSIVE METABOLIC PANEL
ALK PHOS: 115 U/L (ref 39–117)
ALT: 31 U/L (ref 0–35)
AST: 28 U/L (ref 0–37)
Albumin: 4.3 g/dL (ref 3.5–5.2)
BILIRUBIN TOTAL: 0.3 mg/dL (ref 0.2–1.2)
BUN: 20 mg/dL (ref 6–23)
CALCIUM: 9.4 mg/dL (ref 8.4–10.5)
CO2: 32 meq/L (ref 19–32)
Chloride: 103 mEq/L (ref 96–112)
Creatinine, Ser: 0.92 mg/dL (ref 0.40–1.20)
GFR: 65.27 mL/min (ref >60.00)
Glucose, Bld: 106 mg/dL — ABNORMAL HIGH (ref 70–99)
POTASSIUM: 4.3 meq/L (ref 3.5–5.1)
Sodium: 142 mEq/L (ref 135–145)
Total Protein: 7.9 g/dL (ref 6.0–8.3)

## 2016-08-02 LAB — LIPID PANEL
CHOLESTEROL: 151 mg/dL (ref 0–200)
HDL: 39.1 mg/dL (ref >39.00)
LDL CALC: 77 mg/dL (ref 0–99)
NonHDL: 111.97
TRIGLYCERIDES: 177 mg/dL — AB (ref 0.0–149.0)
Total CHOL/HDL Ratio: 4
VLDL: 35.4 mg/dL (ref 0.0–40.0)

## 2016-08-02 LAB — C-REACTIVE PROTEIN: CRP: 0.8 mg/dL (ref 0.5–20.0)

## 2016-08-02 LAB — CK: Total CK: 87 U/L (ref 7–177)

## 2016-08-02 LAB — SEDIMENTATION RATE: SED RATE: 21 mm/h (ref 0–30)

## 2016-08-02 LAB — HEMOGLOBIN A1C: Hgb A1c MFr Bld: 6 % (ref 4.6–6.5)

## 2016-08-02 NOTE — Progress Notes (Signed)
Pre visit review using our clinic review tool, if applicable. No additional management support is needed unless otherwise documented below in the visit note. 

## 2016-08-02 NOTE — Patient Instructions (Signed)
We are checking labs today and will call you back about the results.   We are also getting a CT scan of the head and you will get called about doing that soon.

## 2016-08-02 NOTE — Progress Notes (Signed)
   Subjective:    Patient ID: Holly Obrien, female    DOB: 26-Nov-1951, 65 y.o.   MRN: [id number]  HPI The patient is a 65 YO female coming in for balance problems and weakness in her muscles. Can be her upper or lower extremities. She is having this for about 1 week. It is daily and she has almost fallen several times. She is able to grab on to things and wait and it improves slightly. More with repeated action but can happen even at rest with arms. No numbness or weakness of one side more than the other. No fevers or chills. No neck stiffness. No illness, chest pains, SOB, abdominal pain, nausea, diarrhea, constipation.   Review of Systems  Constitutional: Positive for activity change. Negative for appetite change, chills, fatigue, fever and unexpected weight change.  Respiratory: Negative.   Cardiovascular: Negative.   Gastrointestinal: Negative.   Musculoskeletal: Positive for gait problem and myalgias. Negative for arthralgias and back pain.  Skin: Negative.   Neurological: Positive for dizziness and weakness. Negative for seizures, syncope, speech difficulty, light-headedness and numbness.  Psychiatric/Behavioral: Negative.       Objective:   Physical Exam  Constitutional: She is oriented to person, place, and time. She appears well-developed and well-nourished.  HENT:  Head: Normocephalic and atraumatic.  Eyes: EOM are normal.  Neck: Normal range of motion.  Cardiovascular: Normal rate and regular rhythm.   Pulmonary/Chest: Effort normal and breath sounds normal. No respiratory distress. She has no wheezes. She has no rales.  Abdominal: Soft. Bowel sounds are normal. She exhibits no distension. There is no tenderness.  Musculoskeletal: She exhibits no edema.  Neurological: She is alert and oriented to person, place, and time. No cranial nerve deficit. Coordination abnormal.  Slow to stand, CN 2-12 intact, strength equal left and right upper and lower extremity.   Skin: Skin is  warm and dry.   Vitals:   08/02/16 1353  BP: (!) 148/90  Pulse: (!) 103  Resp: 14  Temp: 98.6 F (37 C)  TempSrc: Oral  SpO2: 98%  Weight: 222 lb 1.9 oz (100.8 kg)  Height: 5' 3.5" (1.613 m)      Assessment & Plan:  Flu shot given at visit.

## 2016-08-03 ENCOUNTER — Ambulatory Visit
Admission: RE | Admit: 2016-08-03 | Discharge: 2016-08-03 | Disposition: A | Discharge status: Home / Self Care | Payer: Federal, State, Local not specified - PPO | Source: Ambulatory Visit | Attending: Internal Medicine | Admitting: Internal Medicine

## 2016-08-03 ENCOUNTER — Telehealth: Payer: Self-pay | Admitting: Emergency Medicine

## 2016-08-03 DIAGNOSIS — R531 Weakness: Secondary | ICD-10-CM | POA: Insufficient documentation

## 2016-08-03 DIAGNOSIS — M6281 Muscle weakness (generalized): Secondary | ICD-10-CM

## 2016-08-03 LAB — ANA: Anti Nuclear Antibody(ANA): NEGATIVE

## 2016-08-03 NOTE — Telephone Encounter (Signed)
Error

## 2016-08-03 NOTE — Assessment & Plan Note (Addendum)
Checking labs today for explanation. Checking head CT stat for concerns about stroke or other brain changes. Concerning for muscle problem or MG. If no explanation refer to neurologist.

## 2016-08-06 ENCOUNTER — Other Ambulatory Visit: Payer: Self-pay | Admitting: Internal Medicine

## 2016-08-06 DIAGNOSIS — R29898 Other symptoms and signs involving the musculoskeletal system: Secondary | ICD-10-CM

## 2016-08-07 ENCOUNTER — Encounter: Payer: Self-pay | Admitting: Nurse Practitioner

## 2016-08-07 ENCOUNTER — Ambulatory Visit: Payer: Federal, State, Local not specified - PPO | Admitting: Nurse Practitioner

## 2016-08-07 VITALS — BP 126/84 | HR 76 | Ht 63.0 in | Wt 222.0 lb

## 2016-08-07 DIAGNOSIS — Z79890 Hormone replacement therapy (postmenopausal): Secondary | ICD-10-CM | POA: Diagnosis not present

## 2016-08-07 DIAGNOSIS — Z01419 Encounter for gynecological examination (general) (routine) without abnormal findings: Secondary | ICD-10-CM | POA: Diagnosis not present

## 2016-08-07 LAB — ACETYLCHOLINE RECEPTOR AB, ALL
ACETYLCHOL BLOCK AB: 19 % (ref 0–25)
AChR Binding Ab, Serum: <0.03 nmol/L (ref 0.00–0.24)

## 2016-08-07 MED ORDER — ESTROGENS CONJUGATED 0.3 MG PO TABS: ORAL_TABLET | ORAL | 0 refills | Status: DC

## 2016-08-07 NOTE — Patient Instructions (Signed)

## 2016-08-07 NOTE — Progress Notes (Signed)
Patient ID: Holly Obrien, female   DOB: [birth year], 65 y.o.   MRN: [id number]  65 y.o. G28P2002 Married  Caucasian Fe here for annual exam.  Now diagnosed glaucoma last year.  Saw PCP for fatigue of head and neck.  CT scan on Friday of head was normal.  She is awaiting labs and may need to see a Neurologist.  Patient's last menstrual period was 05/01/1987.          Sexually active: No.  The current method of family planning is post menopausal status.    Exercising: No.  The patient does not participate in regular exercise at present. Smoker:  no  Health Maintenance: Pap:  04/23/01, Negative MMG:  08/24/14, 3D, Bi-Rads 1: Negative  Colonoscopy: 12/30/2006 diverticulitis recheck in 10 years TDaP: 11/26/06  Shingles: 01/30/16 Pneumonia: Not indicated due to age Hep C: 01/30/16 HIV: deferred by PCP Labs: PCP   reports that she has never smoked. She has never used smokeless tobacco. She reports that she does not drink alcohol or use drugs.  Past Medical History:  Diagnosis Date  . Asthma    with URI's  . Esophagitis    HH/ERD  . Hyperlipidemia    NMR 2007  . Thyroid disease    hypothyroidism    Past Surgical History:  Procedure Laterality Date  . ABDOMINAL HYSTERECTOMY  04/30/88   Hysterectomy and bladder tact-- Dr Molli Hazard  . CARPAL TUNNEL RELEASE  06/18/00   right wrist -- Dr Tamala Fothergill  . FINGER SURGERY  12/10/06   left index finger injury--Dr Theodis Sato  . OOPHORECTOMY  09/11/94, 01/14/96   1995-left ovary removed. 1997 Right ovary removed--Dr Molli Hazard  . PUBOVAGINAL SLING  1997   Dr Karsten Ro  . TRIGGER FINGER RELEASE Right 01/2014  . TRIGGER FINGER RELEASE Left 09/2014    Current Outpatient Prescriptions  Medication Sig Dispense Refill  . albuterol (VENTOLIN HFA) 108 (90 BASE) MCG/ACT inhaler 1-2 puffs q 4 hrs prn 18 g 2  . aspirin 81 MG tablet Take 81 mg by mouth daily.    . B Complex Vitamins (B-COMPLEX/B-12 PO) Take 1,000 mcg by mouth every morning.     . Calcium  Carb-Cholecalciferol (CALCIUM 600/VITAMIN D3) 600-800 MG-UNIT TABS Take 1 tablet by mouth daily.    . Cholecalciferol (VITAMIN D3) 2000 UNITS TABS Take 1 tablet by mouth daily.    . Coenzyme Q10 200 MG capsule Take 200 mg by mouth daily.    Marland Kitchen estrogens, conjugated, (PREMARIN) 0.3 MG tablet Take daily (Patient taking differently: Take every other day) 90 tablet 3  . fluticasone (FLONASE) 50 MCG/ACT nasal spray 1 spray in each nostril twice a day as needed. Use the "crossover" technique as discussed 16 g 2  . Fluticasone-Salmeterol (ADVAIR DISKUS) 250-50 MCG/DOSE AEPB Inhale 1 puff into the lungs 2 (two) times daily as needed. 60 each 2  . Lactobacillus (PROBIOTIC ACIDOPHILUS PO) Take by mouth.    . latanoprost (XALATAN) 0.005 % ophthalmic solution Place 0.005 drops into both eyes at bedtime and may repeat dose one time if needed.  3  . levothyroxine (SYNTHROID, LEVOTHROID) 50 MCG tablet Take 1 tablet (50 mcg total) by mouth daily before breakfast. Reported on 01/30/2016 90 tablet 3  . Magnesium 500 MG CAPS Take 1 capsule by mouth daily.    . Multiple Vitamins-Minerals (ECHINACEA ACZ PO) Take 1 tablet by mouth daily. Reported on 01/30/2016    . pantoprazole (PROTONIX) 40 MG tablet TAKE 1 TABLET (40 MG  TOTAL) BY MOUTH DAILY. --- PLEASE ESTABLISH WITH NEW PCP FOR FURTHER REFILLS 90 tablet 2  . simvastatin (ZOCOR) 20 MG tablet Take 1 tablet (20 mg total) by mouth at bedtime. 90 tablet 3  . zinc gluconate 50 MG tablet Take 50 mg by mouth daily.     No current facility-administered medications for this visit.     Family History  Problem Relation Age of Onset  . Arthritis Other   . Depression Other   . Thyroid disease Other     hyperthyroidism  . Stroke Other   . Heart disease Other     CABG  . Diabetes Other     hypoglycemia  . Osteoarthritis Mother   . Hyperlipidemia Mother   . Hypertension Mother   . Thyroid disease Mother   . Heart failure Father   . Cancer Father   . Heart failure Brother    . Hypertension Brother   . Hypertension Sister     ROS:  Pertinent items are noted in HPI.  Otherwise, a comprehensive ROS was negative.  Exam:   LMP 05/01/1987    Ht Readings from Last 3 Encounters:  08/02/16 5' 3.5" (1.613 m)  01/30/16 5\' 3"  (1.6 m)  08/03/15 5' 2.75" (1.594 m)    General appearance: alert, cooperative and appears stated age Head: Normocephalic, without obvious abnormality, atraumatic Neck: no adenopathy, supple, symmetrical, trachea midline and thyroid normal to inspection and palpation Lungs: clear to auscultation bilaterally Breasts: normal appearance, no masses or tenderness Heart: regular rate and rhythm Abdomen: soft, non-tender; no masses,  no organomegaly Extremities: extremities normal, atraumatic, no cyanosis or edema Skin: Skin color, texture, turgor normal. No rashes or lesions Lymph nodes: Cervical, supraclavicular, and axillary nodes normal. No abnormal inguinal nodes palpated Neurologic: Grossly normal   Pelvic: External genitalia:  no lesions              Urethra:  normal appearing urethra with no masses, tenderness or lesions              Bartholin's and Skene's: normal                 Vagina: normal appearing vagina with normal color and discharge, no lesions              Cervix: absent              Pap taken: No. Bimanual Exam:  Uterus:  uterus absent              Adnexa: no mass, fullness, tenderness               Rectovaginal: Confirms               Anus:  normal sphincter tone, no lesions  Chaperone present: no  A:  Well Woman with normal exam  S/P TAH 1989, later [place] and sling, ERT since 1997  History of breast cyst  History of hypothyroid  History of hypercholesterolemia, GERD  Increase in weight secondary to decrease exercise     P:   Reviewed health and wellness pertinent to exam  Pap smear as above  Mammogram is past due and will schedule  Will only give 1 month of ERT until Mammogram is  done  Counseled with risk of DVT, CVA, cancer  Counseled on breast self exam, mammography screening, use and side effects of HRT, adequate intake of calcium and vitamin D, diet and exercise return annually or prn  An After Visit Summary was printed and given to the patient.

## 2016-08-12 NOTE — Progress Notes (Signed)
Encounter reviewed by Dr. Dyquan Minks Amundson C. Silva.  

## 2016-08-24 ENCOUNTER — Other Ambulatory Visit: Payer: Self-pay | Admitting: Geriatric Medicine

## 2016-08-24 ENCOUNTER — Telehealth: Payer: Self-pay | Admitting: Internal Medicine

## 2016-08-24 ENCOUNTER — Encounter: Payer: Self-pay | Admitting: Neurology

## 2016-08-24 ENCOUNTER — Ambulatory Visit (INDEPENDENT_AMBULATORY_CARE_PROVIDER_SITE_OTHER): Payer: Federal, State, Local not specified - PPO | Admitting: Neurology

## 2016-08-24 VITALS — BP 104/80 | HR 84 | Ht 63.0 in | Wt 221.0 lb

## 2016-08-24 DIAGNOSIS — M6281 Muscle weakness (generalized): Secondary | ICD-10-CM | POA: Diagnosis not present

## 2016-08-24 MED ORDER — PANTOPRAZOLE SODIUM 40 MG PO TBEC: DELAYED_RELEASE_TABLET | ORAL | 2 refills | Status: DC

## 2016-08-24 NOTE — Progress Notes (Signed)
NEUROLOGY CONSULTATION NOTE  [name] MRN: [id number] DOB: 07/14/1951  Referring provider: Dr. Pricilla Holm Primary care provider: Dr. Pricilla Holm  Reason for consult:  weakness  Dear Dr Sharlet Salina:  Thank you for your kind referral of Holly Obrien for consultation of the above symptoms. Although her history is well known to you, please allow me to reiterate it for the purpose of our medical record. Records and images were personally reviewed where available.  HISTORY OF PRESENT ILLNESS: This is a pleasant 65 year old right-handed woman with a history of asthma, hyperlipidemia, hypothyroidism, presenting for evaluation of episodic muscle weakness. She reports feeling tired all the time, however over the past 3-4 years, she had been having intermittent brief episodes where she could not control her legs for a minute or so. She would have to hold on to something otherwise she could not walk. At the beginning of this month, symptoms started to affect both arms, for a week she felt that every time she would raise an arm up, it felt like a rubber band was pulling it down. She also feels this in her neck when she turns it side to side, it feels like a rubber band is pulling it back. There is a little pain on both sides of her neck. She denies any associated numbness/tingling. No recent falls or heavy lifting. She reports symptoms are on both sides, and occur even when she is sitting and reaching up for things. She has some bladder incontinence (s/p bladder sling), no bowel dysfunction. She was having severe cramps in her legs with statin, this was reduced by cutting statin dose in half. She reports a history of "silent migraines" where she could not focus and start having vertical diplopia lasting a few seconds up to a minute. She has to sit and be still and wait it out. She denies any headaches for many years. No ptosis or dysphagia/dysarthria. She has chronic low back pain and  feels unsteady with walking, no falls. There is no family history of similar symptoms, however she reports her mother had "weak legs and a bad back." She denies any recent illness, travels, or immunizations.  She had bloodwork with her PCP with negative myasthenia panel, normal CK 87, CRP and ESR, ANA negative.  PAST MEDICAL HISTORY: Past Medical History:  Diagnosis Date  . Asthma    with URI's  . Cataract   . Esophagitis    HH/ERD  . Glaucoma   . Hyperlipidemia    NMR 2007  . Thyroid disease    hypothyroidism    PAST SURGICAL HISTORY: Past Surgical History:  Procedure Laterality Date  . ABDOMINAL HYSTERECTOMY  04/30/88   Hysterectomy and bladder tact-- Dr Molli Hazard  . CARPAL TUNNEL RELEASE  06/18/00   right wrist -- Dr Tamala Fothergill  . FINGER SURGERY  12/10/06   left index finger injury--Dr Theodis Sato  . OOPHORECTOMY  09/11/94, 01/14/96   1995-left ovary removed. 1997 Right ovary removed--Dr Molli Hazard  . PUBOVAGINAL SLING  1997   Dr Karsten Ro  . TRIGGER FINGER RELEASE Right 01/2014  . TRIGGER FINGER RELEASE Left 09/2014    MEDICATIONS: Current Outpatient Prescriptions on File Prior to Visit  Medication Sig Dispense Refill  . albuterol (VENTOLIN HFA) 108 (90 BASE) MCG/ACT inhaler 1-2 puffs q 4 hrs prn 18 g 2  . aspirin 81 MG tablet Take 81 mg by mouth daily.    . B Complex Vitamins (B-COMPLEX/B-12 PO) Take 1,000 mcg by  mouth every morning.     . Calcium Carb-Cholecalciferol (CALCIUM 600/VITAMIN D3) 600-800 MG-UNIT TABS Take 1 tablet by mouth daily.    . Cholecalciferol (VITAMIN D3) 2000 UNITS TABS Take 1 tablet by mouth daily.    . Coenzyme Q10 200 MG capsule Take 200 mg by mouth daily.    Marland Kitchen estrogens, conjugated, (PREMARIN) 0.3 MG tablet Take every other day 30 tablet 0  . fluticasone (FLONASE) 50 MCG/ACT nasal spray 1 spray in each nostril twice a day as needed. Use the "crossover" technique as discussed 16 g 2  . Fluticasone-Salmeterol (ADVAIR DISKUS) 250-50 MCG/DOSE AEPB  Inhale 1 puff into the lungs 2 (two) times daily as needed. 60 each 2  . Lactobacillus (PROBIOTIC ACIDOPHILUS PO) Take by mouth.    . latanoprost (XALATAN) 0.005 % ophthalmic solution Place 0.005 drops into both eyes at bedtime and may repeat dose one time if needed.  3  . levothyroxine (SYNTHROID, LEVOTHROID) 50 MCG tablet Take 1 tablet (50 mcg total) by mouth daily before breakfast. Reported on 01/30/2016 90 tablet 3  . Magnesium 500 MG CAPS Take 1 capsule by mouth daily.    . Multiple Vitamins-Minerals (ECHINACEA ACZ PO) Take 1 tablet by mouth daily. Reported on 01/30/2016    . Nutritional Supplements (ESTROVEN MAXIMUM STRENGTH PO) Take 1 tablet by mouth every other day.    . pantoprazole (PROTONIX) 40 MG tablet TAKE 1 TABLET (40 MG TOTAL) BY MOUTH DAILY. --- PLEASE ESTABLISH WITH NEW PCP FOR FURTHER REFILLS 90 tablet 2  . simvastatin (ZOCOR) 20 MG tablet Take 1 tablet (20 mg total) by mouth at bedtime. 90 tablet 3  . timolol (BETIMOL) 0.5 % ophthalmic solution Place 1 drop into both eyes daily.    Marland Kitchen zinc gluconate 50 MG tablet Take 50 mg by mouth daily.     No current facility-administered medications on file prior to visit.     ALLERGIES: Allergies  Allergen Reactions  . Ciprofloxacin     Rash Ciloxan per eye doctor  . Erythromycin Swelling    Face swelling  . Penicillins     Rash as child; numbness & tickling neck & head  '@22'   . Cefuroxime Axetil     uncertain  . Sulfonamide Derivatives     ? reaction    FAMILY HISTORY: Family History  Problem Relation Age of Onset  . Arthritis Other   . Depression Other   . Thyroid disease Other     hyperthyroidism  . Stroke Other   . Heart disease Other     CABG  . Diabetes Other     hypoglycemia  . Osteoarthritis Mother   . Hyperlipidemia Mother   . Hypertension Mother   . Thyroid disease Mother   . Heart failure Father   . Cancer Father   . Heart failure Brother   . Hypertension Brother   . Hypertension Sister     SOCIAL  HISTORY: Social History   Social History  . Marital status: Married    Spouse name: N/A  . Number of children: N/A  . Years of education: N/A   Occupational History  . Not on file.   Social History Main Topics  . Smoking status: Never Smoker  . Smokeless tobacco: Never Used  . Alcohol use No  . Drug use: No  . Sexual activity: No   Other Topics Concern  . Not on file   Social History Narrative  . No narrative on file    REVIEW OF SYSTEMS:  Constitutional: No fevers, chills, or sweats, no generalized fatigue, change in appetite Eyes: No visual changes, double vision, eye pain Ear, nose and throat: No hearing loss, ear pain, nasal congestion, sore throat Cardiovascular: No chest pain, palpitations Respiratory:  No shortness of breath at rest or with exertion, wheezes GastrointestinaI: No nausea, vomiting, diarrhea, abdominal pain, fecal incontinence Genitourinary:  No dysuria, urinary retention or frequency Musculoskeletal:  No neck pain, +back pain Integumentary: No rash, pruritus, skin lesions Neurological: as above Psychiatric: No depression, insomnia, anxiety Endocrine: No palpitations, fatigue, diaphoresis, mood swings, change in appetite, change in weight, increased thirst Hematologic/Lymphatic:  No anemia, purpura, petechiae. Allergic/Immunologic: no itchy/runny eyes, nasal congestion, recent allergic reactions, rashes  PHYSICAL EXAM: Vitals:   08/24/16 1251  BP: 104/80  Pulse: 84   General: No acute distress Head:  Normocephalic/atraumatic Eyes: Fundoscopic exam shows bilateral sharp discs, no vessel changes, exudates, or hemorrhages Neck: supple, no paraspinal tenderness, full range of motion Back: No paraspinal tenderness Heart: regular rate and rhythm Lungs: Clear to auscultation bilaterally. Vascular: No carotid bruits. Skin/Extremities: No rash, no edema Neurological Exam: Mental status: alert and oriented to person, place, and time, no dysarthria  or aphasia, Fund of knowledge is appropriate.  Recent and remote memory are intact.  Attention and concentration are normal.    Able to name objects and repeat phrases. Cranial nerves: CN I: not tested CN II: pupils equal, round and reactive to light, visual fields intact, fundi unremarkable. CN III, IV, VI:  full range of motion, no nystagmus, no ptosis/fatigable ptosis CN V: facial sensation intact CN VII: upper and lower face symmetric CN VIII: hearing intact to finger rub CN IX, X: gag intact, uvula midline CN XI: sternocleidomastoid and trapezius muscles intact CN XII: tongue midline Bulk & Tone: normal, no fasciculations. Motor: 5/5 throughout with no pronator drift. Sensation: intact to light touch, cold, pin, vibration and joint position sense.  No extinction to double simultaneous stimulation.  Romberg test negative Deep Tendon Reflexes: +2 throughout, no ankle clonus Plantar responses: downgoing bilaterally Cerebellar: no incoordination on finger to nose, heel to shin. No dysdiadochokinesia Gait: narrow-based and steady, able to tandem walk adequately. Tremor: none  IMPRESSION: This is a 65 year old right-handed woman with a history of asthma, hyperlipidemia, hypothyroidism, presenting for evaluation of episodic muscle weakness. Her neurological exam today is normal, no fatigability noted on exam. Myasthenia panel negative, CK normal. The etiology of her symptoms is unclear, considerations include neuromuscular junction disorder versus myopathy, EMG/NCV of the left UE and LE will be ordered. She will follow-up after the EMG.  Thank you for allowing me to participate in the care of this patient. Please do not hesitate to call for any questions or concerns.   Ellouise Newer, M.D.  CC: Dr. Sharlet Salina

## 2016-08-24 NOTE — Telephone Encounter (Signed)
Pt called in and needs refill on her pantoprazole (PROTONIX) 40 MG tablet [id number]

## 2016-08-24 NOTE — Patient Instructions (Signed)
1. Schedule EMG/NCV of the left UE and LE with Dr. Posey Pronto 2. Follow-up after EMG

## 2016-08-24 NOTE — Telephone Encounter (Signed)
Sent to pharmacy 

## 2016-08-30 ENCOUNTER — Telehealth: Payer: Self-pay | Admitting: Internal Medicine

## 2016-08-30 NOTE — Telephone Encounter (Signed)
Patient called in to advise that pantoprazole (PROTONIX) 40 MG tablet [id number] needs insurance override. States that [facility] has been faxing [facility]-3 times and we have no record of it.   [phone]

## 2016-09-03 NOTE — Telephone Encounter (Signed)
PA initiated via CoverMyMeds key [id number]

## 2016-09-05 NOTE — Telephone Encounter (Signed)
PA APPROVED 07/07/2016 through 09/05/2017 key [id number]

## 2016-09-05 NOTE — Telephone Encounter (Signed)
Pt advised of same.  

## 2016-09-15 ENCOUNTER — Other Ambulatory Visit: Payer: Self-pay | Admitting: Nurse Practitioner

## 2016-09-15 DIAGNOSIS — Z1231 Encounter for screening mammogram for malignant neoplasm of breast: Secondary | ICD-10-CM

## 2016-09-18 ENCOUNTER — Ambulatory Visit (INDEPENDENT_AMBULATORY_CARE_PROVIDER_SITE_OTHER): Payer: Federal, State, Local not specified - PPO | Admitting: Neurology

## 2016-09-18 DIAGNOSIS — R200 Anesthesia of skin: Secondary | ICD-10-CM | POA: Diagnosis not present

## 2016-09-18 DIAGNOSIS — M5417 Radiculopathy, lumbosacral region: Secondary | ICD-10-CM

## 2016-09-18 DIAGNOSIS — M6281 Muscle weakness (generalized): Secondary | ICD-10-CM

## 2016-09-18 NOTE — Procedures (Signed)
[facility]  [address]  [place], [place] [address] Tel: [phone] Fax:  [phone] Test Date:  09/18/2016  Patient: Holly Obrien DOB: 12/27/1950 Physician: Narda Amber, DO  Sex: Female Height: 5\' 3"  Ref Phys: Ellouise Newer, M.D.  ID#: [id number] Temp: 33.0C Technician: Jerilynn Mages. Dean   Patient Complaints: This is a 65 year old female referred for evaluation of episodic weakness involving the arms and legs.   NCV & EMG Findings: Extensive electrodiagnostic testing of the left upper and lower extremities and additional studies of the right lower extremity shows:  1. Left median and sural sensory responses are within normal limits. 2. Left median and peroneal motor responses are within normal limits.  3. Repetitive nerve stimulation of the spinal accessory, median, and peroneal nerves recording at the trapezius, abductor pollicis brevis, and tibialis anterior, respectively, is within normal limits and does not show any evidence of decrement. 4. In the upper extremity, there is no evidence of active or chronic motor axon loss changes. Motor unit configuration and recruitment pattern is within normal limits.  5. In the lower extremities, chronic motor axon loss changes are seen affecting the L5 myotomes bilaterally, without accompanied active denervation.  Impression: 1. Chronic L5 radiculopathy affecting bilateral lower extremities; mild in degree electrically. 2. There is no evidence of a diffuse myopathy, neuromuscular junction disorder, or sensorimotor polyneuropathy.   ___________________________ Narda Amber, DO    Nerve Conduction Studies Anti Sensory Summary Table   Stim Site NR Peak (ms) Norm Peak (ms) P-T Amp (V) Norm P-T Amp  Left Median Anti Sensory (2nd Digit)  Wrist    3.7 <3.8 16.1 >10  Left Sural Anti Sensory (Lat Mall)  Calf    3.2 <4.6 13.6 >3   Motor Summary Table   Stim Site NR Onset (ms) Norm Onset (ms) O-P Amp (mV) Norm O-P Amp  Site1 Site2 Delta-0 (ms) Dist (cm) Vel (m/s) Norm Vel (m/s)  Left Median Motor (Abd Poll Brev)  Wrist    4.0 <4.0 7.4 >5 Elbow Wrist 4.4 23.0 52 >50  Elbow    8.4  7.0         Left Peroneal Motor (Ext Dig Brev)  Ankle    4.1 <6.0 3.6 >2.5 B Fib Ankle 6.8 33.0 49 >40  B Fib    10.9  2.9  Poplt B Fib 1.8 10.0 56 >40  Poplt    12.7  2.9         Left Peroneal TA Motor (Tib Ant)  Fib Head    3.4 <4.5 3.4 >3 Poplit Fib Head 1.8 10.0 56 >40  Poplit    5.2  3.0          EMG   Side Muscle Ins Act Fibs Psw Fasc Number Recrt Dur Dur. Amp Amp. Poly Poly. Comment  Left 1stDorInt Nml Nml Nml Nml Nml Nml Nml Nml Nml Nml Nml Nml N/A  Left AntTibialis Nml Nml Nml Nml 1- Rapid Some 1+ Some 1+ Nml Nml N/A  Left Gastroc Nml Nml Nml Nml Nml Nml Nml Nml Nml Nml Nml Nml N/A  Left RectFemoris Nml Nml Nml Nml Nml Nml Nml Nml Nml Nml Nml Nml N/A  Left BicepsFemS Nml Nml Nml Nml Nml Nml Nml Nml Nml Nml Nml Nml N/A  Left GluteusMed Nml Nml Nml Nml 1- Rapid Some 1+ Some 1+ Nml Nml N/A  Left BrachioRad Nml Nml Nml Nml Nml Nml Nml Nml Nml Nml Nml Nml N/A  Left PronatorTeres Nml  Nml Nml Nml Nml Nml Nml Nml Nml Nml Nml Nml N/A  Left Biceps Nml Nml Nml Nml Nml Nml Nml Nml Nml Nml Nml Nml N/A  Left FlexPolLong Nml Nml Nml Nml Nml Nml Nml Nml Nml Nml Nml Nml N/A  Left Triceps Nml Nml Nml Nml Nml Nml Nml Nml Nml Nml Nml Nml N/A  Left Deltoid Nml Nml Nml Nml Nml Nml Nml Nml Nml Nml Nml Nml N/A  Left Flex Dig Long Nml Nml Nml Nml 1- Rapid Some 1+ Some 1+ Nml Nml N/A  Right AntTibialis Nml Nml Nml Nml 1- Rapid Some 1+ Some 1+ Nml Nml N/A  Right Gastroc Nml Nml Nml Nml Nml Nml Nml Nml Nml Nml Nml Nml N/A  Right GluteusMed Nml Nml Nml Nml 1- Rapid Some 1+ Few 1+ Nml Nml N/A   RNS   Trial # Label Amp 1 (mV)  O-P Amp 5 (mV)  O-P Amp % Dif Area 1 (mVms) Area 5 (mVms) Area % Dif Rep Rate Train Length Pause Time (min:sec) Comments  Left Abd Poll Brev  Tr 1 Baseline 7.17 7.23 0.8 30.12 27.47 -8.8 3.00 10 00:30   Tr 2 Post  Exercise 7.49 7.52 0.4 29.05 27.05 -6.9 3.00 10 01:00   Tr 3 1 Min Post 6.68 6.44 -3.5 27.28 23.80 -12.8 3.00 10 01:00   Tr 4 2 Min Post 7.30 7.33 0.4 30.34 27.86 -8.2 3.00 10 01:00   Tr 5 3 Min Post 7.16 7.48 4.4 29.78 27.64 -7.2 3.00 10 00:00   Left AntTibialis  Tr 1 Baseline 3.16 3.01 -4.5 13.69 12.66 -7.5 3.00 10 00:30   Tr 2 Post Exercise 3.30 3.26 -1.3 15.83 14.76 -6.8 3.00 10 01:00   Tr 3 1 Min Post 3.59 3.56 -1.0 17.79 16.70 -6.1 3.00 10 01:00   Tr 4 2 Min Post 3.72 3.64 -1.9 19.25 17.60 -8.5 3.00 10 01:00   Tr 5 3 Min Post 3.28 3.18 -3.0 15.40 14.07 -8.7 3.00 10 00:00   Left Trapezius  Tr 1 Baseline 5.74 5.69 -0.9 36.99 34.39 -7.0 3.00 10 00:30   Tr 2 Post Exercise 6.43 6.12 -4.8 38.00 35.11 -7.6 3.00 10 01:00   Tr 3 1 Min Post 5.65 5.49 -2.8 34.85 32.46 -6.8 3.00 10 01:00   Tr 4 2 Min Post 6.45 6.26 -2.9 40.53 36.52 -9.9 3.00 10 01:00   Tr 5 3 Min Post 6.41 6.18 -3.6 40.01 36.18 -9.6 3.00 10 00:00              Waveforms:

## 2016-10-05 ENCOUNTER — Ambulatory Visit
Admission: RE | Admit: 2016-10-05 | Discharge: 2016-10-05 | Disposition: A | Discharge status: Home / Self Care | Payer: Federal, State, Local not specified - PPO | Source: Ambulatory Visit | Attending: Nurse Practitioner | Admitting: Nurse Practitioner

## 2016-10-05 ENCOUNTER — Other Ambulatory Visit: Payer: Self-pay | Admitting: Nurse Practitioner

## 2016-10-05 DIAGNOSIS — Z1231 Encounter for screening mammogram for malignant neoplasm of breast: Secondary | ICD-10-CM

## 2016-10-05 NOTE — Telephone Encounter (Signed)
Medication refill request: PREMARIN 0.3MG  Last AEX:  08/07/16 PG Next AEX: 08/09/17  Last MMG (if hormonal medication request): 08/24/14 BIRADS 1 negative; Scheduled for 10/05/16 for MMG  Refill authorized: 08/07/16 #30 w/0 refills; today please advise

## 2016-10-24 ENCOUNTER — Encounter: Payer: Self-pay | Admitting: Neurology

## 2016-10-24 ENCOUNTER — Ambulatory Visit (INDEPENDENT_AMBULATORY_CARE_PROVIDER_SITE_OTHER): Payer: Federal, State, Local not specified - PPO | Admitting: Neurology

## 2016-10-24 VITALS — BP 124/76 | HR 84 | Ht 63.0 in | Wt 221.2 lb

## 2016-10-24 DIAGNOSIS — R55 Syncope and collapse: Secondary | ICD-10-CM | POA: Diagnosis not present

## 2016-10-24 DIAGNOSIS — M5417 Radiculopathy, lumbosacral region: Secondary | ICD-10-CM | POA: Diagnosis not present

## 2016-10-24 NOTE — Patient Instructions (Signed)
1. Schedule MRI brain without contrast 2. Schedule MRI lumbar spine without contrast 3. Follow-up in 2 months, our office will call you with results

## 2016-10-24 NOTE — Progress Notes (Signed)
NEUROLOGY FOLLOW UP OFFICE NOTE  PAOLINA KARWOWSKI [id number]  HISTORY OF PRESENT ILLNESS: I had the pleasure of seeing Swathi Dauphin in follow-up in the neurology clinic on 10/24/2016.  The patient was last seen 2 months ago for episodic muscle weakness. Her EMG/NCV of the left upper and both lower extremities did not show any evidence of a myopathy or neuromuscular junction disorder. There was note of a chronic L5 radiculopathy affecting both LE, mild in degree. She had presented reporting intermittent brief episodes where she could not control her legs for a minute or so. She also started to notice that when she lifted her arms up, it felt like a rubber band was pulling it down. She still has the arm heaviness on a daily basis. The leg symptoms are intermittent. She now reports a different symptom where she feels lightheaded and has to sit down. They were out shopping one time when all of a sudden she felt like she was going to black out, "like gravity was pulling me down." She had to hold on to the shelf for a good while. She felt a little clammy, no associated chest pain or shortness of breath. She denies any headaches or vision changes.   HPI 08/24/2016: This is a pleasant 65 yo RH woman with a history of asthma, hyperlipidemia, hypothyroidism, who presented for evaluation of episodic muscle weakness. She reports feeling tired all the time, however over the past 3-4 years, she had been having intermittent brief episodes where she could not control her legs for a minute or so. She would have to hold on to something otherwise she could not walk. At the beginning of this month, symptoms started to affect both arms, for a week she felt that every time she would raise an arm up, it felt like a rubber band was pulling it down. She also feels this in her neck when she turns it side to side, it feels like a rubber band is pulling it back. There is a little pain on both sides of her neck. She denies any  associated numbness/tingling. No recent falls or heavy lifting. She reports symptoms are on both sides, and occur even when she is sitting and reaching up for things. She has some bladder incontinence (s/p bladder sling), no bowel dysfunction. She was having severe cramps in her legs with statin, this was reduced by cutting statin dose in half. She reports a history of "silent migraines" where she could not focus and start having vertical diplopia lasting a few seconds up to a minute. She has to sit and be still and wait it out. She denies any headaches for many years. No ptosis or dysphagia/dysarthria. She has chronic low back pain and feels unsteady with walking, no falls. There is no family history of similar symptoms, however she reports her mother had "weak legs and a bad back." She denies any recent illness, travels, or immunizations.  She had bloodwork with her PCP with negative myasthenia panel, normal CK 87, CRP and ESR, ANA negative.  PAST MEDICAL HISTORY: Past Medical History:  Diagnosis Date  . Asthma    with URI's  . Cataract   . Esophagitis    HH/ERD  . Glaucoma   . Hyperlipidemia    NMR 2007  . Thyroid disease    hypothyroidism    MEDICATIONS: Current Outpatient Prescriptions on File Prior to Visit  Medication Sig Dispense Refill  . albuterol (VENTOLIN HFA) 108 (90 BASE) MCG/ACT inhaler 1-2 puffs q  4 hrs prn 18 g 2  . aspirin 81 MG tablet Take 81 mg by mouth daily.    . B Complex Vitamins (B-COMPLEX/B-12 PO) Take 1,000 mcg by mouth every morning.     . Calcium Carb-Cholecalciferol (CALCIUM 600/VITAMIN D3) 600-800 MG-UNIT TABS Take 1 tablet by mouth daily.    . Cholecalciferol (VITAMIN D3) 2000 UNITS TABS Take 1 tablet by mouth daily.    . Coenzyme Q10 200 MG capsule Take 200 mg by mouth daily.    . fluticasone (FLONASE) 50 MCG/ACT nasal spray 1 spray in each nostril twice a day as needed. Use the "crossover" technique as discussed 16 g 2  . Fluticasone-Salmeterol (ADVAIR  DISKUS) 250-50 MCG/DOSE AEPB Inhale 1 puff into the lungs 2 (two) times daily as needed. 60 each 2  . Lactobacillus (PROBIOTIC ACIDOPHILUS PO) Take by mouth.    . latanoprost (XALATAN) 0.005 % ophthalmic solution Place 0.005 drops into both eyes at bedtime and may repeat dose one time if needed.  3  . levothyroxine (SYNTHROID, LEVOTHROID) 50 MCG tablet Take 1 tablet (50 mcg total) by mouth daily before breakfast. Reported on 01/30/2016 90 tablet 3  . Magnesium 500 MG CAPS Take 1 capsule by mouth daily.    . Multiple Vitamins-Minerals (ECHINACEA ACZ PO) Take 1 tablet by mouth daily. Reported on 01/30/2016    . Nutritional Supplements (ESTROVEN MAXIMUM STRENGTH PO) Take 1 tablet by mouth every other day.    . pantoprazole (PROTONIX) 40 MG tablet TAKE 1 TABLET (40 MG TOTAL) BY MOUTH DAILY. 30 tablet 2  . PREMARIN 0.3 MG tablet TAKE 1 TABLET BY MOUTH EVERY OTHER DAY 30 tablet 0  . simvastatin (ZOCOR) 20 MG tablet Take 1 tablet (20 mg total) by mouth at bedtime. 90 tablet 3  . timolol (BETIMOL) 0.5 % ophthalmic solution Place 1 drop into both eyes daily.    Marland Kitchen zinc gluconate 50 MG tablet Take 50 mg by mouth daily.     No current facility-administered medications on file prior to visit.     ALLERGIES: Allergies  Allergen Reactions  . Ciprofloxacin     Rash Ciloxan per eye doctor  . Erythromycin Swelling    Face swelling  . Penicillins     Rash as child; numbness & tickling neck & head  _0   . Cefuroxime Axetil     uncertain  . Sulfonamide Derivatives     ? reaction    FAMILY HISTORY: Family History  Problem Relation Age of Onset  . Arthritis Other   . Depression Other   . Thyroid disease Other     hyperthyroidism  . Stroke Other   . Heart disease Other     CABG  . Diabetes Other     hypoglycemia  . Osteoarthritis Mother   . Hyperlipidemia Mother   . Hypertension Mother   . Thyroid disease Mother   . Heart failure Father   . Cancer Father   . Heart failure Brother   .  Hypertension Brother   . Hypertension Sister     SOCIAL HISTORY: Social History   Social History  . Marital status: Married    Spouse name: N/A  . Number of children: N/A  . Years of education: N/A   Occupational History  . Not on file.   Social History Main Topics  . Smoking status: Never Smoker  . Smokeless tobacco: Never Used  . Alcohol use No  . Drug use: No  . Sexual activity: No   Other  Topics Concern  . Not on file   Social History Narrative  . No narrative on file    REVIEW OF SYSTEMS: Constitutional: No fevers, chills, or sweats, no generalized fatigue, change in appetite Eyes: No visual changes, double vision, eye pain Ear, nose and throat: No hearing loss, ear pain, nasal congestion, sore throat Cardiovascular: No chest pain, palpitations Respiratory:  No shortness of breath at rest or with exertion, wheezes GastrointestinaI: No nausea, vomiting, diarrhea, abdominal pain, fecal incontinence Genitourinary:  No dysuria, urinary retention or frequency Musculoskeletal:  No neck pain, back pain Integumentary: No rash, pruritus, skin lesions Neurological: as above Psychiatric: No depression, insomnia, anxiety Endocrine: No palpitations, fatigue, diaphoresis, mood swings, change in appetite, change in weight, increased thirst Hematologic/Lymphatic:  No anemia, purpura, petechiae. Allergic/Immunologic: no itchy/runny eyes, nasal congestion, recent allergic reactions, rashes  PHYSICAL EXAM: Vitals:   10/24/16 1403  BP: 124/76  Pulse: 84   Orthostatic VS for the past 24 hrs:  BP- Lying Pulse- Lying BP- Sitting Pulse- Sitting BP- Standing at 0 minutes Pulse- Standing at 0 minutes  10/24/16 1436 122/76 82 124/80 96 124/84 96   General: No acute distress Head:  Normocephalic/atraumatic Neck: supple, no paraspinal tenderness, full range of motion Heart:  Regular rate and rhythm Lungs:  Clear to auscultation bilaterally Back: No paraspinal  tenderness Skin/Extremities: No rash, no edema Neurological Exam: alert and oriented to person, place, and time. No aphasia or dysarthria. Fund of knowledge is appropriate.  Recent and remote memory are intact.  Attention and concentration are normal.    Able to name objects and repeat phrases. Cranial nerves: Pupils equal, round, reactive to light.   Extraocular movements intact with no nystagmus. Visual fields full. Facial sensation intact. No facial asymmetry. Tongue, uvula, palate midline.  Motor: Bulk and tone normal, muscle strength 5/5 throughout with no pronator drift.  Sensation to light touch, temperature and vibration intact.  No extinction to double simultaneous stimulation.  Deep tendon reflexes 2+ throughout, toes downgoing.  Finger to nose testing intact. Able to stand with arms crossed over chest. Gait narrow-based and steady, able to tandem walk adequately.  Romberg negative.  IMPRESSION: This is a 65 yo RH woman with a history of asthma, hyperlipidemia, hypothyroidism, who initially presented for evaluation of episodic muscle weakness. Her EMG did not show any evidence of myopathy or neuromuscular junction disorder. It did show chronic bilateral L5 radiculopathy, mild. An MRI of the lumbar spine without contrast will be ordered to assess for underlying structural abnormality. She reports symptoms today where she feels like she would black out and feel lightheaded, where she has to hold on to things, different from the leg weakness in the past. MRI brain without contrast will be ordered for completion, we discussed other potential causes such as orthostasis and hypoglycemia. Continue to monitor symptoms. We discussed the etiology of her symptoms is unclear, if results of testing are unremarkable, we will consider obtaining a second opinion from an academic center. She will follow-up in 2 months.   Thank you for allowing me to participate in her care.  Please do not hesitate to call for any  questions or concerns.  The duration of this appointment visit was 25 minutes of face-to-face time with the patient.  Greater than 50% of this time was spent in counseling, explanation of diagnosis, planning of further management, and coordination of care.   Ellouise Newer, M.D.   CC: Dr. Sharlet Salina

## 2016-11-06 ENCOUNTER — Ambulatory Visit
Admission: RE | Admit: 2016-11-06 | Discharge: 2016-11-06 | Disposition: A | Discharge status: Home / Self Care | Payer: Federal, State, Local not specified - PPO | Source: Ambulatory Visit | Attending: Neurology | Admitting: Neurology

## 2016-11-06 DIAGNOSIS — R55 Syncope and collapse: Secondary | ICD-10-CM

## 2016-11-06 DIAGNOSIS — M5417 Radiculopathy, lumbosacral region: Secondary | ICD-10-CM

## 2016-11-07 ENCOUNTER — Ambulatory Visit: Payer: Federal, State, Local not specified - PPO | Admitting: Neurology

## 2016-11-07 ENCOUNTER — Encounter: Payer: Self-pay | Admitting: Neurology

## 2016-11-07 ENCOUNTER — Other Ambulatory Visit: Payer: Self-pay

## 2016-11-07 ENCOUNTER — Other Ambulatory Visit: Payer: Self-pay | Admitting: Internal Medicine

## 2016-11-07 ENCOUNTER — Ambulatory Visit (INDEPENDENT_AMBULATORY_CARE_PROVIDER_SITE_OTHER): Payer: Federal, State, Local not specified - PPO | Admitting: Neurology

## 2016-11-07 VITALS — BP 132/74 | HR 96 | Ht 63.0 in | Wt 220.6 lb

## 2016-11-07 DIAGNOSIS — M899 Disorder of bone, unspecified: Secondary | ICD-10-CM

## 2016-11-07 DIAGNOSIS — M5417 Radiculopathy, lumbosacral region: Secondary | ICD-10-CM

## 2016-11-07 DIAGNOSIS — R9389 Abnormal findings on diagnostic imaging of other specified body structures: Secondary | ICD-10-CM

## 2016-11-07 NOTE — Progress Notes (Signed)
NEUROLOGY FOLLOW UP OFFICE NOTE  Holly Obrien [id number]  HISTORY OF PRESENT ILLNESS: I had the pleasure of seeing Holly Obrien in follow-up in the neurology clinic on 11/07/2016.  The patient was last seen 2 weeks ago for episodic muscle weakness. Her EMG/NCV of the left upper and both lower extremities did not show any evidence of a myopathy or neuromuscular junction disorder. There was note of a chronic L5 radiculopathy affecting both LE, mild in degree. She had presented reporting intermittent brief episodes where she could not control her legs for a minute or so. She also started to notice that when she lifted her arms up, it felt like a rubber band was pulling it down. She still has the arm heaviness on a daily basis. The leg symptoms are intermittent.   She presents today to discuss MRI findings. I personally reviewed MRI brain and lumbar spine with the patient. MRI brain is unremarkable, no acute changes seen. Her MRI lumbar spine showed some mild degenerative changes, however most concerning was the abnormal signal throughout the posterior elements and L5 vertebral body consistent with diffuse sclerosis. Differentials include lymphoma, Paget's disease, chordoma, plasmocytoma, and osteoblastic metastases such as breast cancer. She had a mammogram last month which was normal. She has occasional low back pain and reports recurrent bending can worsen the pain. No bowel/bladder dysfunction. She occasionally feels unbalanced, no vertigo, no falls.  HPI 08/24/2016: This is a pleasant 65 yo RH woman with a history of asthma, hyperlipidemia, hypothyroidism, who presented for evaluation of episodic muscle weakness. She reports feeling tired all the time, however over the past 3-4 years, she had been having intermittent brief episodes where she could not control her legs for a minute or so. She would have to hold on to something otherwise she could not walk. At the beginning of this month, symptoms  started to affect both arms, for a week she felt that every time she would raise an arm up, it felt like a rubber band was pulling it down. She also feels this in her neck when she turns it side to side, it feels like a rubber band is pulling it back. There is a little pain on both sides of her neck. She denies any associated numbness/tingling. No recent falls or heavy lifting. She reports symptoms are on both sides, and occur even when she is sitting and reaching up for things. She has some bladder incontinence (s/p bladder sling), no bowel dysfunction. She was having severe cramps in her legs with statin, this was reduced by cutting statin dose in half. She reports a history of "silent migraines" where she could not focus and start having vertical diplopia lasting a few seconds up to a minute. She has to sit and be still and wait it out. She denies any headaches for many years. No ptosis or dysphagia/dysarthria. She has chronic low back pain and feels unsteady with walking, no falls. There is no family history of similar symptoms, however she reports her mother had "weak legs and a bad back." She denies any recent illness, travels, or immunizations.  She had bloodwork with her PCP with negative myasthenia panel, normal CK 87, CRP and ESR, ANA negative.  PAST MEDICAL HISTORY: Past Medical History:  Diagnosis Date  . Asthma    with URI's  . Cataract   . Esophagitis    HH/ERD  . Glaucoma   . Hyperlipidemia    NMR 2007  . Thyroid disease    hypothyroidism  MEDICATIONS: Current Outpatient Prescriptions on File Prior to Visit  Medication Sig Dispense Refill  . albuterol (VENTOLIN HFA) 108 (90 BASE) MCG/ACT inhaler 1-2 puffs q 4 hrs prn 18 g 2  . aspirin 81 MG tablet Take 81 mg by mouth daily.    . B Complex Vitamins (B-COMPLEX/B-12 PO) Take 1,000 mcg by mouth every morning.     . Calcium Carb-Cholecalciferol (CALCIUM 600/VITAMIN D3) 600-800 MG-UNIT TABS Take 1 tablet by mouth daily.    .  Cholecalciferol (VITAMIN D3) 2000 UNITS TABS Take 1 tablet by mouth daily.    . Coenzyme Q10 200 MG capsule Take 200 mg by mouth daily.    . fluticasone (FLONASE) 50 MCG/ACT nasal spray 1 spray in each nostril twice a day as needed. Use the "crossover" technique as discussed 16 g 2  . Fluticasone-Salmeterol (ADVAIR DISKUS) 250-50 MCG/DOSE AEPB Inhale 1 puff into the lungs 2 (two) times daily as needed. 60 each 2  . Lactobacillus (PROBIOTIC ACIDOPHILUS PO) Take by mouth.    . latanoprost (XALATAN) 0.005 % ophthalmic solution Place 0.005 drops into both eyes at bedtime and may repeat dose one time if needed.  3  . levothyroxine (SYNTHROID, LEVOTHROID) 50 MCG tablet Take 1 tablet (50 mcg total) by mouth daily before breakfast. Reported on 01/30/2016 90 tablet 3  . Magnesium 500 MG CAPS Take 1 capsule by mouth daily.    . Multiple Vitamins-Minerals (ECHINACEA ACZ PO) Take 1 tablet by mouth daily. Reported on 01/30/2016    . Nutritional Supplements (ESTROVEN MAXIMUM STRENGTH PO) Take 1 tablet by mouth every other day.    . pantoprazole (PROTONIX) 40 MG tablet TAKE 1 TABLET (40 MG TOTAL) BY MOUTH DAILY. 30 tablet 2  . PREMARIN 0.3 MG tablet TAKE 1 TABLET BY MOUTH EVERY OTHER DAY 30 tablet 0  . simvastatin (ZOCOR) 20 MG tablet Take 1 tablet (20 mg total) by mouth at bedtime. 90 tablet 3  . timolol (BETIMOL) 0.5 % ophthalmic solution Place 1 drop into both eyes daily.    Marland Kitchen zinc gluconate 50 MG tablet Take 50 mg by mouth daily.     No current facility-administered medications on file prior to visit.     ALLERGIES: Allergies  Allergen Reactions  . Ciprofloxacin     Rash Ciloxan per eye doctor  . Erythromycin Swelling    Face swelling  . Penicillins     Rash as child; numbness & tickling neck & head  '@22'   . Cefuroxime Axetil     uncertain  . Sulfonamide Derivatives     ? reaction    FAMILY HISTORY: Family History  Problem Relation Age of Onset  . Arthritis Other   . Depression Other   .  Thyroid disease Other     hyperthyroidism  . Stroke Other   . Heart disease Other     CABG  . Diabetes Other     hypoglycemia  . Osteoarthritis Mother   . Hyperlipidemia Mother   . Hypertension Mother   . Thyroid disease Mother   . Heart failure Father   . Cancer Father   . Heart failure Brother   . Hypertension Brother   . Hypertension Sister     SOCIAL HISTORY: Social History   Social History  . Marital status: Married    Spouse name: N/A  . Number of children: N/A  . Years of education: N/A   Occupational History  . Not on file.   Social History Main Topics  . Smoking  status: Never Smoker  . Smokeless tobacco: Never Used  . Alcohol use No  . Drug use: No  . Sexual activity: No   Other Topics Concern  . Not on file   Social History Narrative  . No narrative on file    REVIEW OF SYSTEMS: Constitutional: No fevers, chills, or sweats, no generalized fatigue, change in appetite Eyes: No visual changes, double vision, eye pain Ear, nose and throat: No hearing loss, ear pain, nasal congestion, sore throat Cardiovascular: No chest pain, palpitations Respiratory:  No shortness of breath at rest or with exertion, wheezes GastrointestinaI: No nausea, vomiting, diarrhea, abdominal pain, fecal incontinence Genitourinary:  No dysuria, urinary retention or frequency Musculoskeletal:  No neck pain, +back pain Integumentary: No rash, pruritus, skin lesions Neurological: as above Psychiatric: No depression, insomnia, anxiety Endocrine: No palpitations, fatigue, diaphoresis, mood swings, change in appetite, change in weight, increased thirst Hematologic/Lymphatic:  No anemia, purpura, petechiae. Allergic/Immunologic: no itchy/runny eyes, nasal congestion, recent allergic reactions, rashes  PHYSICAL EXAM: Vitals:   11/07/16 1526  BP: 132/74  Pulse: 96   No data found.  General: No acute distress Head:  Normocephalic/atraumatic Skin/Extremities: No rash, no  edema Neurological Exam: alert and oriented to person, place, and time. No aphasia or dysarthria. Fund of knowledge is appropriate.  Recent and remote memory are intact.  Attention and concentration are normal.    Able to name objects and repeat phrases. Cranial nerves: Pupils equal, round. No facial asymmetry. Tongue, uvula, palate midline.  Motor: moves all extremities symmetrically. Gait narrow-based and steady  IMPRESSION: This is a pleasant 65 yo RH woman with a history of asthma, hyperlipidemia, hypothyroidism, who initially presented for evaluation of episodic muscle weakness. Her EMG did not show any evidence of myopathy or neuromuscular junction disorder. It did show chronic bilateral L5 radiculopathy, mild. She had an MRI lumbar spine yesterday and presents today to discuss the findings.  MRI lumbar spine showed some mild degenerative changes, however most concerning was the abnormal signal throughout the posterior elements and L5 vertebral body consistent with diffuse sclerosis. We discussed differentials include lymphoma, Paget's disease, chordoma, plasmocytoma, and osteoblastic metastases such as breast cancer. Her mammogram last month was normal. These have been communicated to her PCP Dr. Sharlet Salina as well, cervical and thoracic MRI with and without contrast will be ordered by Dr. Sharlet Salina to assess for other spinal involvement, as well as CT scan of chest, abdomen and pelvis to look for primary malignancy. She will be referred to Dr. Alvy Bimler for further evaluation and treatment. We discussed that the finding on her L5 vertebra would not cause the initial symptoms she had presented with, but certainly needs further evaluation. She has a follow-up with me in February and will keep that appointment.   Thank you for allowing me to participate in her care.  Please do not hesitate to call for any questions or concerns.  The duration of this appointment visit was 15 minutes of face-to-face time with  the patient.  Greater than 50% of this time was spent in counseling, explanation of diagnosis, planning of further management, and coordination of care.   Ellouise Newer, M.D.   CC: Dr. Sharlet Salina, Dr. Alvy Bimler

## 2016-11-07 NOTE — Patient Instructions (Signed)
Dr. Sharlet Salina will be scheduling you for an MRI of the cervical and thoracic spine, as well as a CT of the chest, abdomen and pelvis. You will be referred to Dr. Alvy Bimler for further evaluation. Follow-up as scheduled in February.

## 2016-11-13 ENCOUNTER — Telehealth: Payer: Self-pay | Admitting: Emergency Medicine

## 2016-11-13 DIAGNOSIS — R9089 Other abnormal findings on diagnostic imaging of central nervous system: Secondary | ICD-10-CM

## 2016-11-13 NOTE — Telephone Encounter (Signed)
[facility] called and needs some orders in Epic corrected. The patient has already had MRI lumbar on 11/06/16 so she needs to get another order put in. Please advise thanks.

## 2016-11-13 NOTE — Telephone Encounter (Signed)
The lumbar was done on 11/06/16. The patient said that it should be a thoracic instead of a lumbar. Please advise, thanks.

## 2016-11-13 NOTE — Telephone Encounter (Signed)
Patient has already had an MRI. [facility] wants you to put in another order that covers the problem that you are trying to find other than a lumbar MRI. Please advise thanks.

## 2016-11-13 NOTE — Telephone Encounter (Signed)
I'm not sure what needs to be entered or corrected.

## 2016-11-13 NOTE — Telephone Encounter (Signed)
Placed the thoracic imaging test.

## 2016-11-16 ENCOUNTER — Encounter: Payer: Self-pay | Admitting: Hematology and Oncology

## 2016-11-25 ENCOUNTER — Other Ambulatory Visit: Payer: Self-pay | Admitting: Internal Medicine

## 2016-11-27 ENCOUNTER — Ambulatory Visit
Admission: RE | Admit: 2016-11-27 | Discharge: 2016-11-27 | Disposition: A | Discharge status: Home / Self Care | Payer: Federal, State, Local not specified - PPO | Source: Ambulatory Visit | Attending: Internal Medicine | Admitting: Internal Medicine

## 2016-11-27 ENCOUNTER — Other Ambulatory Visit: Payer: Self-pay | Admitting: Internal Medicine

## 2016-11-27 DIAGNOSIS — R9389 Abnormal findings on diagnostic imaging of other specified body structures: Secondary | ICD-10-CM

## 2016-11-27 DIAGNOSIS — R9089 Other abnormal findings on diagnostic imaging of central nervous system: Secondary | ICD-10-CM

## 2016-11-27 DIAGNOSIS — N289 Disorder of kidney and ureter, unspecified: Secondary | ICD-10-CM

## 2016-11-27 MED ORDER — IOPAMIDOL (ISOVUE-300) INJECTION 61%
100.0000 mL | Freq: Once | INTRAVENOUS | Status: AC | PRN
  Administered 2016-11-27: 100 mL via INTRAVENOUS

## 2016-11-29 ENCOUNTER — Telehealth: Payer: Self-pay | Admitting: *Deleted

## 2016-11-29 ENCOUNTER — Ambulatory Visit (HOSPITAL_BASED_OUTPATIENT_CLINIC_OR_DEPARTMENT_OTHER): Payer: Federal, State, Local not specified - PPO | Admitting: Hematology and Oncology

## 2016-11-29 ENCOUNTER — Encounter: Payer: Self-pay | Admitting: Hematology and Oncology

## 2016-11-29 DIAGNOSIS — I2510 Atherosclerotic heart disease of native coronary artery without angina pectoris: Secondary | ICD-10-CM | POA: Diagnosis not present

## 2016-11-29 DIAGNOSIS — M889 Osteitis deformans of unspecified bone: Secondary | ICD-10-CM | POA: Insufficient documentation

## 2016-11-29 DIAGNOSIS — G8929 Other chronic pain: Secondary | ICD-10-CM

## 2016-11-29 DIAGNOSIS — K760 Fatty (change of) liver, not elsewhere classified: Secondary | ICD-10-CM | POA: Insufficient documentation

## 2016-11-29 DIAGNOSIS — N289 Disorder of kidney and ureter, unspecified: Secondary | ICD-10-CM | POA: Diagnosis not present

## 2016-11-29 DIAGNOSIS — M545 Low back pain: Secondary | ICD-10-CM

## 2016-11-29 DIAGNOSIS — Z808 Family history of malignant neoplasm of other organs or systems: Secondary | ICD-10-CM

## 2016-11-29 DIAGNOSIS — Z8673 Personal history of transient ischemic attack (TIA), and cerebral infarction without residual deficits: Secondary | ICD-10-CM

## 2016-11-29 NOTE — Assessment & Plan Note (Addendum)
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

## 2016-11-29 NOTE — Assessment & Plan Note (Signed)
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

## 2016-11-29 NOTE — Assessment & Plan Note (Signed)
She has incidental coronary arteriosclerosis changes and plaques I recommend consideration for cardiology consult and she wants to go home and think about it

## 2016-11-29 NOTE — Telephone Encounter (Signed)
That appointment is one month away. Is there a sooner appt?

## 2016-11-29 NOTE — Progress Notes (Signed)
[place] NOTE  Patient Care Team: Hoyt Koch, MD as PCP - General (Internal Medicine)  CHIEF COMPLAINTS/PURPOSE OF CONSULTATION:  Abnormal bone lesion  HISTORY OF PRESENTING ILLNESS:  Holly Obrien 66 y.o. female is here because of recent abnormal imaging studies. She had recent abnormal gait imbalance and dizziness causing near syncopal episodes She was referred to neurology for evaluation and underwent MRI brain and lumbar spine MRI lumbar spine showed abnormal lesion in L5 region, worrisome for cancer. She was then referred here. While waiting to be seen, I have requested her referring doctors to order pan-CT imaging and other MRI spine imaging She has chronic lower back pain. She fell on her back many years ago She denies abnormal sensation of lower extremity. Denies focal neurological deficits. Her cardiovascular risk factors included hyperlipidemia only. Ct showed multiple incidental findings including kidney lesion. She does not smoke but has passive exposure She denies history of hematuria or abnormal flank pain  MEDICAL HISTORY:  Past Medical History:  Diagnosis Date  . Asthma    with URI's  . Cataract   . Esophagitis    HH/ERD  . Glaucoma   . Hyperlipidemia    NMR 2007  . Thyroid disease    hypothyroidism    SURGICAL HISTORY: Past Surgical History:  Procedure Laterality Date  . ABDOMINAL HYSTERECTOMY  04/30/88   Hysterectomy and bladder tact-- Dr Molli Hazard  . CARPAL TUNNEL RELEASE  06/18/00   right wrist -- Dr Tamala Fothergill  . FINGER SURGERY  12/10/06   left index finger injury--Dr Theodis Sato  . OOPHORECTOMY  09/11/94, 01/14/96   1995-left ovary removed. 1997 Right ovary removed--Dr Molli Hazard  . PUBOVAGINAL SLING  1997   Dr Karsten Ro  . TRIGGER FINGER RELEASE Right 01/2014  . TRIGGER FINGER RELEASE Left 09/2014    SOCIAL HISTORY: Social History   Social History  . Marital status: Married    Spouse name: Ron  . Number  of children: 2  . Years of education: N/A   Occupational History  . 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 Smoking status: Never Smoker  . Smokeless tobacco: Never Used  . Alcohol use No  . Drug use: No  . Sexual activity: No   Other Topics Concern  . Not on file   Social History Narrative  . No narrative on file    FAMILY HISTORY: Family History  Problem Relation Age of Onset  . Arthritis Other   . Depression Other   . Thyroid disease Other     hyperthyroidism  . Stroke Other   . Heart disease Other     CABG  . Diabetes Other     hypoglycemia  . Osteoarthritis Mother   . Hyperlipidemia Mother   . Hypertension Mother   . Thyroid disease Mother   . Heart failure Father   . Cancer Father     mesothelioma  . Heart failure Brother   . Hypertension Brother   . Hypertension Sister     ALLERGIES:  is allergic to ciprofloxacin; erythromycin; penicillins; cefuroxime axetil; and sulfonamide derivatives.  MEDICATIONS:  Current Outpatient Prescriptions  Medication Sig Dispense Refill  . aspirin 81 MG tablet Take 81 mg by mouth daily.    . B Complex Vitamins (B-COMPLEX/B-12 PO) Take 1,000 mcg by mouth every morning.     . Calcium Carb-Cholecalciferol (CALCIUM 600/VITAMIN D3) 600-800 MG-UNIT TABS Take 1 tablet by mouth daily.    Marland Kitchen  Cholecalciferol (VITAMIN D3) 2000 UNITS TABS Take 1 tablet by mouth daily.    . Coenzyme Q10 200 MG capsule Take 200 mg by mouth daily.    Marland Kitchen latanoprost (XALATAN) 0.005 % ophthalmic solution Place 0.005 drops into both eyes at bedtime and may repeat dose one time if needed.  3  . levothyroxine (SYNTHROID, LEVOTHROID) 50 MCG tablet Take 1 tablet (50 mcg total) by mouth daily before breakfast. Reported on 01/30/2016 90 tablet 3  . Magnesium 500 MG CAPS Take 1 capsule by mouth daily.    . Multiple Vitamins-Minerals (ECHINACEA ACZ PO) Take 1 tablet by mouth daily. Reported on 01/30/2016    . Nutritional Supplements (ESTROVEN MAXIMUM  STRENGTH PO) Take 1 tablet by mouth every other day.    . pantoprazole (PROTONIX) 40 MG tablet TAKE 1 TABLET (40 MG TOTAL) BY MOUTH DAILY. 30 tablet 2  . PREMARIN 0.3 MG tablet TAKE 1 TABLET BY MOUTH EVERY OTHER DAY 30 tablet 0  . simvastatin (ZOCOR) 20 MG tablet Take 1 tablet (20 mg total) by mouth at bedtime. 90 tablet 3  . timolol (BETIMOL) 0.5 % ophthalmic solution Place 1 drop into both eyes daily.    Marland Kitchen zinc gluconate 50 MG tablet Take 50 mg by mouth daily.    Marland Kitchen albuterol (VENTOLIN HFA) 108 (90 BASE) MCG/ACT inhaler 1-2 puffs q 4 hrs prn (Patient not taking: Reported on 11/29/2016) 18 g 2  . fluticasone (FLONASE) 50 MCG/ACT nasal spray 1 spray in each nostril twice a day as needed. Use the "crossover" technique as discussed (Patient not taking: Reported on 11/29/2016) 16 g 2  . Fluticasone-Salmeterol (ADVAIR DISKUS) 250-50 MCG/DOSE AEPB Inhale 1 puff into the lungs 2 (two) times daily as needed. (Patient not taking: Reported on 11/29/2016) 60 each 2  . Lactobacillus (PROBIOTIC ACIDOPHILUS PO) Take by mouth.     No current facility-administered medications for this visit.     REVIEW OF SYSTEMS:   Constitutional: Denies fevers, chills or abnormal night sweats Eyes: Denies blurriness of vision, double vision or watery eyes Ears, nose, mouth, throat, and face: Denies mucositis or sore throat Respiratory: Denies cough, dyspnea or wheezes Cardiovascular: Denies palpitation, chest discomfort or lower extremity swelling Gastrointestinal:  Denies nausea, heartburn or change in bowel habits Skin: Denies abnormal skin rashes Lymphatics: Denies new lymphadenopathy or easy bruising Behavioral/Psych: Mood is stable, no new changes  All other systems were reviewed with the patient and are negative.  PHYSICAL EXAMINATION: ECOG PERFORMANCE STATUS: 1 - Symptomatic but completely ambulatory  Vitals:   11/29/16 1207  BP: 138/63  Pulse: 93  Resp: 18  Temp: 98.6 F (37 C)   Filed Weights   11/29/16  1207  Weight: 218 lb 6.4 oz (99.1 kg)    GENERAL:alert, no distress and comfortable. She is moderately obese SKIN: skin color, texture, turgor are normal, no rashes or significant lesions EYES: normal, conjunctiva are pink and non-injected, sclera clear OROPHARYNX:no exudate, no erythema and lips, buccal mucosa, and tongue normal  NECK: supple, thyroid normal size, non-tender, without nodularity LYMPH:  no palpable lymphadenopathy in the cervical, axillary or inguinal LUNGS: clear to auscultation and percussion with normal breathing effort HEART: regular rate & rhythm and no murmurs and no lower extremity edema ABDOMEN:abdomen soft, non-tender and normal bowel sounds Musculoskeletal:no cyanosis of digits and no clubbing  PSYCH: alert & oriented x 3 with fluent speech NEURO: no focal motor/sensory deficits. Has very mild difficulties with tandem walking  LABORATORY DATA:  I have reviewed  the data as listed Lab Results  Component Value Date   WBC 8.5 08/02/2016   HGB 14.0 08/02/2016   HCT 41.7 08/02/2016   MCV 86.9 08/02/2016   PLT 304.0 08/02/2016    Recent Labs  01/30/16 1030 08/02/16 1429  NA 143 142  K 4.3 4.3  CL 104 103  CO2 29 32  GLUCOSE 95 106*  BUN 19 20  CREATININE 0.76 0.92  CALCIUM 9.9 9.4  PROT 7.8 7.9  ALBUMIN 4.4 4.3  AST 28 28  ALT 28 31  ALKPHOS 119* 115  BILITOT 0.6 0.3    RADIOGRAPHIC STUDIES:I reviewed multiple imaging studies with her and her husband I have personally reviewed the radiological images as listed and agreed with the findings in the report. Ct Chest W Contrast  Result Date: 11/27/2016 CLINICAL DATA:  66 year old female with abnormal lumbar spine MRI suspicious for potential metastatic disease. Evaluate for primary malignancy. EXAM: CT CHEST, ABDOMEN, AND PELVIS WITH CONTRAST TECHNIQUE: Multidetector CT imaging of the chest, abdomen and pelvis was performed following the standard protocol during bolus administration of intravenous  contrast. CONTRAST:  168mL ISOVUE-300 IOPAMIDOL (ISOVUE-300) INJECTION 61% COMPARISON:  No priors.  Lumbar spine MRI 11/06/2016. FINDINGS: CT CHEST FINDINGS Cardiovascular: Heart size is normal. There is no significant pericardial fluid, thickening or pericardial calcification. There is aortic atherosclerosis, as well as atherosclerosis of the great vessels of the mediastinum and the coronary arteries, including calcified atherosclerotic plaque in the right coronary artery. Mediastinum/Nodes: No pathologically enlarged mediastinal or hilar lymph nodes. Small hiatal hernia. No axillary lymphadenopathy. Lungs/Pleura: No suspicious appearing pulmonary nodules or masses. No acute consolidative airspace disease. No pleural effusions. Musculoskeletal: There are no aggressive appearing lytic or blastic lesions noted in the visualized portions of the skeleton. CT ABDOMEN PELVIS FINDINGS Hepatobiliary: Diffuse low attenuation throughout the hepatic parenchyma, compatible with hepatic steatosis. No suspicious cystic or solid hepatic lesions. No intra or extrahepatic biliary ductal dilatation. Gallbladder is normal in appearance. Pancreas: No pancreatic mass. No pancreatic ductal dilatation. No pancreatic or peripancreatic fluid or inflammatory changes. Spleen: Unremarkable. Adrenals/Urinary Tract: Bilateral adrenal glands and the left kidney are normal in appearance. In the anterior aspect of the interpolar region of the right kidney there is a 2 cm heterogeneously enhancing lesion highly concerning for a small renal cell carcinoma (axial image 102 of series 2). This lesion is separate from the right renal vein which is widely patent, and is encapsulated within Gerota's fascia. No hydroureteronephrosis or perinephric stranding to suggest urinary tract obstruction at this time. Urinary bladder is nearly completely decompressed. Stomach/Bowel: The appearance of the stomach is normal. There is no pathologic dilatation of small  bowel or colon. Several colonic diverticulae are noted, particularly in the sigmoid colon, without surrounding inflammatory changes to suggest an acute diverticulitis at this time. The appendix is not confidently identified and may be surgically absent. Regardless, there are no inflammatory changes noted adjacent to the cecum to suggest the presence of an acute appendicitis at this time. Vascular/Lymphatic: Aortic atherosclerosis, without evidence of aneurysm or dissection in the abdominal or pelvic vasculature. No lymphadenopathy is noted in the abdomen or pelvis. Reproductive: Status post total abdominal hysterectomy. Ovaries are not confidently identified may be surgically absent or atrophic. Other: No significant volume of ascites.  No pneumoperitoneum. 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 IMPRESSION: 1. 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 No other aggressive appearing lytic or blastic lesions are noted elsewhere in the visualized portions of the axial or appendicular skeleton are noted to suggest metastatic disease to the bone. 2. There is a small 2 cm enhancing lesion in the anterior aspect of the interpolar region of the right kidney which is concerning for neoplasm such as renal cell carcinoma. Nonemergent outpatient referral to Urology is strongly recommended in the near future for further management of this lesion. 3. Aortic atherosclerosis, in addition to right coronary artery disease. Please note that although the presence of coronary artery calcium documents the presence of coronary artery disease, the severity of this disease and any potential stenosis cannot be assessed on this non-gated CT examination. Assessment for potential risk factor modification, dietary therapy or pharmacologic therapy may be warranted, if  clinically indicated. 4. Hepatic steatosis. 5. Additional incidental findings, as above. These results will be called to the ordering clinician or representative by the Radiologist Assistant, and communication documented in the PACS or zVision Dashboard. Electronically Signed   By: Vinnie Langton M.D.   On: 11/27/2016 16:44   Mr Brain Wo Contrast  Result Date: 11/06/2016 CLINICAL DATA:  Bilateral upper and lower extremity weakness. Near syncope. Lightheadedness. EXAM: MRI HEAD WITHOUT CONTRAST TECHNIQUE: Multiplanar, multiecho pulse sequences of the brain and surrounding structures were obtained without intravenous contrast. COMPARISON:  Head CT 08/03/2016 FINDINGS: Brain: There is no evidence of acute infarct, intracranial hemorrhage, mass, midline shift, or extra-axial fluid collection. The ventricles and sulci are normal. Small foci of T2 hyperintensity are present throughout the subcortical and deep cerebral white matter bilaterally, nonspecific but compatible with moderate chronic small vessel ischemic disease. There is a small chronic infarct in the left cerebellum, unchanged. Mild chronic small vessel ischemic changes are present in the thalami. Vascular: Major intracranial vascular flow voids are preserved. Skull and upper cervical spine: Unremarkable bone marrow signal. Sinuses/Orbits: Unremarkable orbits. Paranasal sinuses and mastoid air cells are clear. Other: None. IMPRESSION: 1. No acute intracranial abnormality or mass. 2. Moderate chronic small vessel ischemic disease. 3. Small chronic left cerebellar infarct. Electronically Signed   By: Logan Bores M.D.   On: 11/06/2016 11:06   Mr Cervical Spine Wo Contrast  Result Date: 11/27/2016 CLINICAL DATA:  Follow-up exam for abnormality seen on prior MRI of the lumbar spine. EXAM: MRI CERVICAL and THORACIC  SPINE WITHOUT CONTRAST TECHNIQUE: Multiplanar and multiecho pulse sequences of the cervical spine, to include the craniocervical junction and  cervicothoracic junction, and thoracic spine, were obtained without intravenous contrast. COMPARISON:  Comparison made with prior MRI from 11/06/2016 FINDINGS: MRI CERVICAL SPINE FINDINGS Alignment: Straightening with slight reversal of the normal cervical lordosis. Trace anterolisthesis of C4 on C5 and C5 on C6. Vertebrae: Vertebral body heights are maintained. No evidence for acute or chronic fracture. Reactive endplate changes noted about the C6-7 interspace. Signal intensity within the vertebral body bone marrow is otherwise unremarkable. No abnormal sclerosis or other concerning osseous lesions identified. Cord: Signal intensity within the cervical spinal cord is normal. Posterior Fossa, vertebral arteries, paraspinal tissues: Visualized portions of the brain and posterior fossa are within normal limits. Craniocervical junction normal. Paraspinous and prevertebral soft tissues within normal limits. Normal intravascular flow voids present within the vertebral arteries bilaterally. Disc levels: C2-C3: C2 and C3 vertebral bodies are largely fused, with fusion of 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 No stenosis. C3-C4: Shallow posterior disc bulge, eccentric to the right, flattening the ventral thecal sac  without significant canal stenosis. Mild bilateral uncovertebral hypertrophy. Bilateral facet arthrosis. There is resultant mild bilateral foraminal narrowing, left greater than right. C4-C5: Diffuse circumferential disc bulge with mild bilateral uncovertebral hypertrophy. Superimposed small central disc protrusion indents the ventral thecal sac results in mild canal stenosis. Mild right foraminal narrowing. C5-C6: Diffuse circumferential disc bulge with mild uncovertebral hypertrophy. Mild canal and bilateral foraminal narrowing, slightly worse on the right. C6-C7: Diffuse degenerative disc bulge with bilateral uncovertebral spurring and reactive endplate changes, greater on the left. Left  foraminal/ far lateral disc osteophyte complex results in severe left foraminal stenosis. More mild right foraminal narrowing. Posterior disc osteophyte flattens the ventral thecal sac without significant canal stenosis. C7-T1: Diffuse disc bulge with mild uncovertebral hypertrophy. Superimposed central disc protrusion with slight cephalad and caudad migration. Protruding disc indents the ventral thecal sac without significant canal stenosis. Mild right foraminal narrowing. MRI THORACIC SPINE FINDINGS Alignment: Vertebral bodies normally aligned with preservation of the normal thoracic kyphosis. No listhesis. Vertebrae: Vertebral body heights are well preserved. No evidence for acute or chronic fracture. Signal intensity within the vertebral body bone marrow is normal. No abnormal sclerosis or worrisome osseous lesions identified. No abnormal marrow edema. Cord:  Signal intensity within the thoracic spinal cord is normal. Paraspinal and other soft tissues: Paraspinous soft tissues within normal limits. Visualized visceral structures are unremarkable. Disc levels: T3-4: Small central disc protrusion indents the ventral thecal sac without significant stenosis. Mild cord flattening without cord signal changes. T4-5: Ligamentum flavum hypertrophy on the left. Small central disc protrusion indents the ventral thecal sac. Resultant mild canal stenosis. T7-8: Small right and left central disc protrusions minimally indent the ventral thecal sac without significant stenosis. T8-9: Tiny right central disc protrusion flattens the ventral thecal sac without significant stenosis. T9-10: Minimal disc bulge. Posterior element hypertrophy. No significant stenosis. T10-11:  Posterior element hypertrophy without stenosis. IMPRESSION: MRI CERVICAL SPINE IMPRESSION: 1. No abnormal sclerosis or worrisome osseous lesions identified within the cervical spine. 2. Moderate multilevel degenerative spondylolysis as detailed above, most  notable at C6-7 where a left foraminal/far lateral disc osteophyte complex results in severe left foraminal stenosis. No other significant foraminal narrowing within the cervical spine. No significant canal stenosis. MRI THORACIC SPINE IMPRESSION: 1. No abnormal sclerosis or worrisome osseous lesions identified within the thoracic spine. 2. Mild multilevel degenerative spondylolysis as detailed above without significant stenosis. Please see above report for a full description of these findings. Electronically Signed   By: Jeannine Boga M.D.   On: 11/27/2016 18:10   Mr Thoracic Spine Wo Contrast  Result Date: 11/27/2016 CLINICAL DATA:  Follow-up exam for abnormality seen on prior MRI of the lumbar spine. EXAM: MRI CERVICAL and THORACIC  SPINE WITHOUT CONTRAST TECHNIQUE: Multiplanar and multiecho pulse sequences of the cervical spine, to include the craniocervical junction and cervicothoracic junction, and thoracic spine, were obtained without intravenous contrast. COMPARISON:  Comparison made with prior MRI from 11/06/2016 FINDINGS: MRI CERVICAL SPINE FINDINGS Alignment: Straightening with slight reversal of the normal cervical lordosis. Trace anterolisthesis of C4 on C5 and C5 on C6. Vertebrae: Vertebral body heights are maintained. No evidence for acute or chronic fracture. Reactive endplate changes noted about the C6-7 interspace. Signal intensity within the vertebral body bone marrow is otherwise unremarkable. No abnormal sclerosis or other concerning osseous lesions identified. Cord: Signal intensity within the cervical spinal cord is normal. Posterior Fossa, vertebral arteries, paraspinal tissues: Visualized portions of the brain and posterior fossa are within normal limits. Craniocervical junction  normal. Paraspinous and prevertebral soft tissues within normal limits. Normal intravascular flow voids present within the vertebral arteries bilaterally. Disc levels: C2-C3: C2 and C3 vertebral bodies are  largely fused, with fusion of 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 No stenosis. C3-C4: Shallow posterior disc bulge, eccentric to the right, flattening the ventral thecal sac without significant canal stenosis. Mild bilateral uncovertebral hypertrophy. Bilateral facet arthrosis. There is resultant mild bilateral foraminal narrowing, left greater than right. C4-C5: Diffuse circumferential disc bulge with mild bilateral uncovertebral hypertrophy. Superimposed small central disc protrusion indents the ventral thecal sac results in mild canal stenosis. Mild right foraminal narrowing. C5-C6: Diffuse circumferential disc bulge with mild uncovertebral hypertrophy. Mild canal and bilateral foraminal narrowing, slightly worse on the right. C6-C7: Diffuse degenerative disc bulge with bilateral uncovertebral spurring and reactive endplate changes, greater on the left. Left foraminal/ far lateral disc osteophyte complex results in severe left foraminal stenosis. More mild right foraminal narrowing. Posterior disc osteophyte flattens the ventral thecal sac without significant canal stenosis. C7-T1: Diffuse disc bulge with mild uncovertebral hypertrophy. Superimposed central disc protrusion with slight cephalad and caudad migration. Protruding disc indents the ventral thecal sac without significant canal stenosis. Mild right foraminal narrowing. MRI THORACIC SPINE FINDINGS Alignment: Vertebral bodies normally aligned with preservation of the normal thoracic kyphosis. No listhesis. Vertebrae: Vertebral body heights are well preserved. No evidence for acute or chronic fracture. Signal intensity within the vertebral body bone marrow is normal. No abnormal sclerosis or worrisome osseous lesions identified. No abnormal marrow edema. Cord:  Signal intensity within the thoracic spinal cord is normal. Paraspinal and other soft tissues: Paraspinous soft tissues within normal limits. Visualized visceral structures  are unremarkable. Disc levels: T3-4: Small central disc protrusion indents the ventral thecal sac without significant stenosis. Mild cord flattening without cord signal changes. T4-5: Ligamentum flavum hypertrophy on the left. Small central disc protrusion indents the ventral thecal sac. Resultant mild canal stenosis. T7-8: Small right and left central disc protrusions minimally indent the ventral thecal sac without significant stenosis. T8-9: Tiny right central disc protrusion flattens the ventral thecal sac without significant stenosis. T9-10: Minimal disc bulge. Posterior element hypertrophy. No significant stenosis. T10-11:  Posterior element hypertrophy without stenosis. IMPRESSION: MRI CERVICAL SPINE IMPRESSION: 1. No abnormal sclerosis or worrisome osseous lesions identified within the cervical spine. 2. Moderate multilevel degenerative spondylolysis as detailed above, most notable at C6-7 where a left foraminal/far lateral disc osteophyte complex results in severe left foraminal stenosis. No other significant foraminal narrowing within the cervical spine. No significant canal stenosis. MRI THORACIC SPINE IMPRESSION: 1. No abnormal sclerosis or worrisome osseous lesions identified within the thoracic spine. 2. Mild multilevel degenerative spondylolysis as detailed above without significant stenosis. Please see above report for a full description of these findings. Electronically Signed   By: Jeannine Boga M.D.   On: 11/27/2016 18:10   Mr Lumbar Spine Wo Contrast  Result Date: 11/06/2016 CLINICAL DATA:  Bilateral upper and lower extremity weakness for several years, worsened over the past couple months. EXAM: MRI LUMBAR SPINE WITHOUT CONTRAST TECHNIQUE: Multiplanar, multisequence MR imaging of the lumbar spine was performed. No intravenous contrast was administered. COMPARISON:  None. FINDINGS: Segmentation:  Standard. Alignment:  Maintained. Vertebrae: The posterior elements an almost the entire  L5 vertebral body is replaced by abnormal marrow signal which is markedly decreased on T1 and T2 weighted imaging. Body demonstrates mildly increased signal on STIR imaging. The appearance is consistent with an ivory vertebra. Bone marrow signal  is otherwise normal. No fracture is identified. Marrow signal is otherwise unremarkable. Conus medullaris: Extends to the T12 level and appears normal. Paraspinal and other soft tissues: Negative Disc levels: T11-12 and T12-L1 are imaged in the sagittal plane only and negative. L1-2:  Negative. L2-3:  Negative. L3-4: Shallow disc bulge and mild-to-moderate facet degenerative change. The central spinal canal and foramina remain open. L4-5: Shallow broad-based central protrusion and moderate facet degenerative change. The central canal and foramina remain open. L5-S1: Shallow broad-based central protrusion without central canal or foraminal stenosis. IMPRESSION: Abnormal signal throughout the posterior elements and L5 vertebral body is consistent with diffuse sclerosis. Differential considerations include lymphoma, Paget's disease, chordoma, plasmocytoma and osteoblastic metastases such as breast cancer. These results will be called to the ordering clinician or representative by the Radiologist Assistant, and communication documented in the PACS or zVision Dashboard. Electronically Signed   By: Inge Rise M.D.   On: 11/06/2016 11:15   Ct Abdomen Pelvis W Contrast  Result Date: 11/27/2016 CLINICAL DATA:  66 year old female with abnormal lumbar spine MRI suspicious for potential metastatic disease. Evaluate for primary malignancy. EXAM: CT CHEST, ABDOMEN, AND PELVIS WITH CONTRAST TECHNIQUE: Multidetector CT imaging of the chest, abdomen and pelvis was performed following the standard protocol during bolus administration of intravenous contrast. CONTRAST:  168mL ISOVUE-300 IOPAMIDOL (ISOVUE-300) INJECTION 61% COMPARISON:  No priors.  Lumbar spine MRI 11/06/2016.  FINDINGS: CT CHEST FINDINGS Cardiovascular: Heart size is normal. There is no significant pericardial fluid, thickening or pericardial calcification. There is aortic atherosclerosis, as well as atherosclerosis of the great vessels of the mediastinum and the coronary arteries, including calcified atherosclerotic plaque in the right coronary artery. Mediastinum/Nodes: No pathologically enlarged mediastinal or hilar lymph nodes. Small hiatal hernia. No axillary lymphadenopathy. Lungs/Pleura: No suspicious appearing pulmonary nodules or masses. No acute consolidative airspace disease. No pleural effusions. Musculoskeletal: There are no aggressive appearing lytic or blastic lesions noted in the visualized portions of the skeleton. CT ABDOMEN PELVIS FINDINGS Hepatobiliary: Diffuse low attenuation throughout the hepatic parenchyma, compatible with hepatic steatosis. No suspicious cystic or solid hepatic lesions. No intra or extrahepatic biliary ductal dilatation. Gallbladder is normal in appearance. Pancreas: No pancreatic mass. No pancreatic ductal dilatation. No pancreatic or peripancreatic fluid or inflammatory changes. Spleen: Unremarkable. Adrenals/Urinary Tract: Bilateral adrenal glands and the left kidney are normal in appearance. In the anterior aspect of the interpolar region of the right kidney there is a 2 cm heterogeneously enhancing lesion highly concerning for a small renal cell carcinoma (axial image 102 of series 2). This lesion is separate from the right renal vein which is widely patent, and is encapsulated within Gerota's fascia. No hydroureteronephrosis or perinephric stranding to suggest urinary tract obstruction at this time. Urinary bladder is nearly completely decompressed. Stomach/Bowel: The appearance of the stomach is normal. There is no pathologic dilatation of small bowel or colon. Several colonic diverticulae are noted, particularly in the sigmoid colon, without surrounding inflammatory  changes to suggest an acute diverticulitis at this time. The appendix is not confidently identified and may be surgically absent. Regardless, there are no inflammatory changes noted adjacent to the cecum to suggest the presence of an acute appendicitis at this time. Vascular/Lymphatic: Aortic atherosclerosis, without evidence of aneurysm or dissection in the abdominal or pelvic vasculature. No lymphadenopathy is noted in the abdomen or pelvis. Reproductive: Status post total abdominal hysterectomy. Ovaries are not confidently identified may be surgically absent or atrophic. Other: No significant volume of ascites.  No pneumoperitoneum. Musculoskeletal: The  entire L5 vertebra is sclerotic with enhanced trabeculations, somewhat expansile in appearance, favored to reflect underlying Paget's disease. IMPRESSION: 1. The appearance of the L5 vertebra strongly favors a benign process such as Paget's disease. No other aggressive appearing lytic or blastic lesions are noted elsewhere in the visualized portions of the axial or appendicular skeleton are noted to suggest metastatic disease to the bone. 2. There is a small 2 cm enhancing lesion in the anterior aspect of the interpolar region of the right kidney which is concerning for neoplasm such as renal cell carcinoma. Nonemergent outpatient referral to Urology is strongly recommended in the near future for further management of this lesion. 3. Aortic atherosclerosis, in addition to right coronary artery disease. Please note that although the presence of coronary artery calcium documents the presence of coronary artery disease, the severity of this disease and any potential stenosis cannot be assessed on this non-gated CT examination. Assessment for potential risk factor modification, dietary therapy or pharmacologic therapy may be warranted, if clinically indicated. 4. Hepatic steatosis. 5. Additional incidental findings, as above. These results will be called to the  ordering clinician or representative by the Radiologist Assistant, and communication documented in the PACS or zVision Dashboard. Electronically Signed   By: Vinnie Langton M.D.   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 Her imaging studies were discussed at recent [facility] oncology tumor board No further work-up or bone biopsy is necessary  Kidney lesion, native, right Her recent abnormal bone imaging led to multiple imaging studies She is noted to have abnormal right kidney mass, suspicious for kidney cancer I will refer her to Urology service for further management She will likely benefit from partial nephrectomy  History of cerebellar stroke Her gait imbalance is likely caused by remote history of cerebellar infarct noted on recent MRI brain She has risk factors for cardiovascular disease and is on aspirin and lipid lowering medications  Coronary arteriosclerosis in native artery She has incidental coronary arteriosclerosis changes and plaques I recommend consideration for cardiology consult and she wants to go home and think about it  Fatty liver disease, nonalcoholic She is moderately obese and is noted to have fatty liver disease We discussed briefly dietary modification and exercise and I encouraged her to lose weight  Orders Placed This Encounter  Procedures  . Ambulatory referral to Urology    Referral Priority:   Routine    Referral Type:   Consultation    Referral Reason:   Specialty Services Required    Referred to Provider:   Alexis Frock, MD    Requested Specialty:   Urology    Number of Visits Requested:   1     All questions were answered. The patient knows to call the clinic with any problems, questions or concerns. I spent 45 minutes counseling the patient face to face. The total time spent in the appointment was 60 minutes and more than 50% was on counseling.     Heath Lark,  MD 11/29/2016 8:39 PM

## 2016-11-29 NOTE — Assessment & Plan Note (Signed)
She is moderately obese and is noted to have fatty liver disease We discussed briefly dietary modification and exercise and I encouraged her to lose weight

## 2016-11-29 NOTE — Assessment & Plan Note (Signed)
Her recent abnormal bone imaging led to multiple imaging studies She is noted to have abnormal right kidney mass, suspicious for kidney cancer I will refer her to Urology service for further management She will likely benefit from partial nephrectomy

## 2016-11-29 NOTE — Telephone Encounter (Signed)
Pt notified of appt with Dr Tresa Moore on 01/01/17 @ 1:00pm [facility].

## 2016-11-30 ENCOUNTER — Encounter: Payer: Self-pay | Admitting: Internal Medicine

## 2016-12-03 ENCOUNTER — Telehealth: Payer: Self-pay | Admitting: Hematology and Oncology

## 2016-12-03 NOTE — Telephone Encounter (Signed)
Per 1/5 los no rtn visit, however referral to urologist has been entered. Spoke with Sharl Ma at [facility] and patient already on schedule to see Dr. Tresa Moore 2/6 @ 12:45pm - per Sharl Ma appointments scheduled with Tami.

## 2016-12-06 ENCOUNTER — Encounter: Payer: Self-pay | Admitting: Gastroenterology

## 2016-12-10 ENCOUNTER — Telehealth: Payer: Self-pay | Admitting: *Deleted

## 2016-12-10 NOTE — Telephone Encounter (Signed)
[facility] and s/w Mickel Baas.  Pt currently scheduled to see Dr. Tresa Moore on 2/6.   Asked if there are any sooner appts?  Mickel Baas says that is first available for Dr. Tresa Moore who sees most of the kidney lesions, but she will see if another provider might be able to see pt sooner.  She will contact pt if there is a sooner available with another provider.

## 2016-12-11 ENCOUNTER — Other Ambulatory Visit: Payer: Self-pay | Admitting: Certified Nurse Midwife

## 2016-12-11 NOTE — Telephone Encounter (Signed)
Medication refill request: Premarin Last AEX:  08/07/16 PG Next AEX: 08/09/17 PG Last MMG (if hormonal medication request): 10/05/16 BIRADS1, Density B, [facility] Refill authorized: 10/05/16 #[id number] 0R. Please advise. Thank you.

## 2017-01-14 ENCOUNTER — Other Ambulatory Visit: Payer: Self-pay | Admitting: Urology

## 2017-01-14 MED ORDER — MAGNESIUM CITRATE PO SOLN: 1.0000 | Freq: Once | ORAL | Status: DC

## 2017-01-17 ENCOUNTER — Other Ambulatory Visit: Payer: Self-pay | Admitting: Internal Medicine

## 2017-01-17 DIAGNOSIS — E038 Other specified hypothyroidism: Secondary | ICD-10-CM

## 2017-01-18 ENCOUNTER — Encounter: Payer: Self-pay | Admitting: Neurology

## 2017-01-18 ENCOUNTER — Ambulatory Visit (INDEPENDENT_AMBULATORY_CARE_PROVIDER_SITE_OTHER): Payer: Federal, State, Local not specified - PPO | Admitting: Neurology

## 2017-01-18 ENCOUNTER — Ambulatory Visit: Payer: Federal, State, Local not specified - PPO | Admitting: Neurology

## 2017-01-18 VITALS — BP 120/78 | HR 78 | Ht 63.0 in | Wt 216.4 lb

## 2017-01-18 DIAGNOSIS — M6281 Muscle weakness (generalized): Secondary | ICD-10-CM

## 2017-01-18 DIAGNOSIS — R42 Dizziness and giddiness: Secondary | ICD-10-CM | POA: Diagnosis not present

## 2017-01-18 NOTE — Patient Instructions (Signed)
1. Wishing you well with the surgery! 2. Once feeling better from the surgery and dizziness still bothersome, call our office to send referral for vestibular therapy 3. Follow-up in 6 months, call for any changes

## 2017-01-18 NOTE — Progress Notes (Signed)
NEUROLOGY FOLLOW UP OFFICE NOTE  RIMA BLIZZARD [id number]  HISTORY OF PRESENT ILLNESS: I had the pleasure of seeing Crystol Walpole in follow-up in the neurology clinic on  01/18/2017.  The patient was last seen 2 months ago for episodic muscle weakness. Her EMG/NCV of the left upper and both lower extremities did not show any evidence of a myopathy or neuromuscular junction disorder. There was note of a chronic L5 radiculopathy affecting both LE, mild in degree. She had presented reporting intermittent brief episodes where she could not control her legs for a minute or so. She also started to notice that when she lifted her arms up, it felt like a rubber band was pulling it down.  MRI brain was unremarkable, no acute changes seen. Her MRI lumbar spine showed some mild degenerative changes, however most concerning was the abnormal signal throughout the posterior elements and L5 vertebral body consistent with diffuse sclerosis. Differentials include lymphoma, Paget's disease, chordoma, plasmocytoma, and osteoblastic metastases such as breast cancer. She had a mammogram last month which was normal  She has kindly been evaluated by Dr. Alvy Bimler, the abnormal bone lesion is classic for Paget's disease. No further workup or bone biopsy felt necessary. She was noted to have an abnormal right kidney mass suspicious for kidney cancer, she will be undergoing resection biopsy soon. She reports that since her last visit, she has not had any more episodes of muscle weakness. She has episodes of dizziness where she has to hold on, and has increased her water intake. She denies any falls, but the other day had a pretty strong episode where she had to hold on to prevent a fall. Sometimes it is just a heaviness in her head. Symptoms are not as bad when she keeps hydrated. They can occur while standing or sitting, her eyes feel they would cross.   HPI 08/24/2016: This is a pleasant 66 yo RH woman with a history of  asthma, hyperlipidemia, hypothyroidism, who presented for evaluation of episodic muscle weakness. She reports feeling tired all the time, however over the past 3-4 years, she had been having intermittent brief episodes where she could not control her legs for a minute or so. She would have to hold on to something otherwise she could not walk. At the beginning of this month, symptoms started to affect both arms, for a week she felt that every time she would raise an arm up, it felt like a rubber band was pulling it down. She also feels this in her neck when she turns it side to side, it feels like a rubber band is pulling it back. There is a little pain on both sides of her neck. She denies any associated numbness/tingling. No recent falls or heavy lifting. She reports symptoms are on both sides, and occur even when she is sitting and reaching up for things. She has some bladder incontinence (s/p bladder sling), no bowel dysfunction. She was having severe cramps in her legs with statin, this was reduced by cutting statin dose in half. She reports a history of "silent migraines" where she could not focus and start having vertical diplopia lasting a few seconds up to a minute. She has to sit and be still and wait it out. She denies any headaches for many years. No ptosis or dysphagia/dysarthria. She has chronic low back pain and feels unsteady with walking, no falls. There is no family history of similar symptoms, however she reports her mother had "weak legs and a  bad back." She denies any recent illness, travels, or immunizations.  She had bloodwork with her PCP with negative myasthenia panel, normal CK 87, CRP and ESR, ANA negative.  PAST MEDICAL HISTORY: Past Medical History:  Diagnosis Date  . Asthma    with URI's  . Cataract   . Esophagitis    HH/ERD  . Glaucoma   . Hyperlipidemia    NMR 2007  . Thyroid disease    hypothyroidism    MEDICATIONS: Current Outpatient Prescriptions on File Prior  to Visit  Medication Sig Dispense Refill  . albuterol (VENTOLIN HFA) 108 (90 BASE) MCG/ACT inhaler 1-2 puffs q 4 hrs prn 18 g 2  . aspirin 81 MG tablet Take 81 mg by mouth daily.    . B Complex Vitamins (B-COMPLEX/B-12 PO) Take 1,000 mcg by mouth every morning.     . Calcium Carb-Cholecalciferol (CALCIUM 600/VITAMIN D3) 600-800 MG-UNIT TABS Take 1 tablet by mouth daily.    . Cholecalciferol (VITAMIN D3) 2000 UNITS TABS Take 1 tablet by mouth daily.    . Coenzyme Q10 200 MG capsule Take 200 mg by mouth daily.    . fluticasone (FLONASE) 50 MCG/ACT nasal spray 1 spray in each nostril twice a day as needed. Use the "crossover" technique as discussed 16 g 2  . Fluticasone-Salmeterol (ADVAIR DISKUS) 250-50 MCG/DOSE AEPB Inhale 1 puff into the lungs 2 (two) times daily as needed. 60 each 2  . Lactobacillus (PROBIOTIC ACIDOPHILUS PO) Take by mouth.    . latanoprost (XALATAN) 0.005 % ophthalmic solution Place 0.005 drops into both eyes at bedtime and may repeat dose one time if needed.  3  . levothyroxine (SYNTHROID, LEVOTHROID) 50 MCG tablet TAKE 1 TABLET (50 MCG TOTAL) BY MOUTH DAILY BEFORE BREAKFAST. REPORTED ON 01/30/2016 90 tablet 3  . Magnesium 500 MG CAPS Take 1 capsule by mouth daily.    . Multiple Vitamins-Minerals (ECHINACEA ACZ PO) Take 1 tablet by mouth daily. Reported on 01/30/2016    . Nutritional Supplements (ESTROVEN MAXIMUM STRENGTH PO) Take 1 tablet by mouth every other day.    . pantoprazole (PROTONIX) 40 MG tablet TAKE 1 TABLET (40 MG TOTAL) BY MOUTH DAILY. 30 tablet 2  . PREMARIN 0.3 MG tablet TAKE 1 TABLET BY MOUTH EVERY OTHER DAY 90 tablet 3  . simvastatin (ZOCOR) 20 MG tablet Take 1 tablet (20 mg total) by mouth at bedtime. 90 tablet 3  . timolol (BETIMOL) 0.5 % ophthalmic solution Place 1 drop into both eyes daily.    Marland Kitchen zinc gluconate 50 MG tablet Take 50 mg by mouth daily.     Current Facility-Administered Medications on File Prior to Visit  Medication Dose Route Frequency  Provider Last Rate Last Dose  . magnesium citrate solution 1 Bottle  1 Bottle Oral Once Alexis Frock, MD        ALLERGIES: Allergies  Allergen Reactions  . Ciprofloxacin     Rash Ciloxan per eye doctor  . Erythromycin Swelling    Face swelling  . Penicillins     Rash as child; numbness & tickling neck & head  _0   . Cefuroxime Axetil     uncertain  . Sulfonamide Derivatives     ? reaction    FAMILY HISTORY: Family History  Problem Relation Age of Onset  . Arthritis Other   . Depression Other   . Thyroid disease Other     hyperthyroidism  . Stroke Other   . Heart disease Other     CABG  .  Diabetes Other     hypoglycemia  . Osteoarthritis Mother   . Hyperlipidemia Mother   . Hypertension Mother   . Thyroid disease Mother   . Heart failure Father   . Cancer Father     mesothelioma  . Heart failure Brother   . Hypertension Brother   . Hypertension Sister     SOCIAL HISTORY: Social History   Social History  . Marital status: Married    Spouse name: Ron  . Number of children: 2  . Years of education: N/A   Occupational History  . 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 Smoking status: Never Smoker  . Smokeless tobacco: Never Used  . Alcohol use No  . Drug use: No  . Sexual activity: No   Other Topics Concern  . Not on file   Social History Narrative  . No narrative on file    REVIEW OF SYSTEMS: Constitutional: No fevers, chills, or sweats, no generalized fatigue, change in appetite Eyes: No visual changes, double vision, eye pain Ear, nose and throat: No hearing loss, ear pain, nasal congestion, sore throat Cardiovascular: No chest pain, palpitations Respiratory:  No shortness of breath at rest or with exertion, wheezes GastrointestinaI: No nausea, vomiting, diarrhea, abdominal pain, fecal incontinence Genitourinary:  No dysuria, urinary retention or frequency Musculoskeletal:  No neck pain, +back pain Integumentary: No  rash, pruritus, skin lesions Neurological: as above Psychiatric: No depression, insomnia, anxiety Endocrine: No palpitations, fatigue, diaphoresis, mood swings, change in appetite, change in weight, increased thirst Hematologic/Lymphatic:  No anemia, purpura, petechiae. Allergic/Immunologic: no itchy/runny eyes, nasal congestion, recent allergic reactions, rashes  PHYSICAL EXAM: Vitals:   01/18/17 1340  BP: 120/78  Pulse: 78   No data found.  General: No acute distress Head:  Normocephalic/atraumatic Skin/Extremities: No rash, no edema Neurological Exam: alert and oriented to person, place, and time. No aphasia or dysarthria. Fund of knowledge is appropriate.  Recent and remote memory are intact.  Attention and concentration are normal.    Able to name objects and repeat phrases. Cranial nerves: Pupils equal, round. No facial asymmetry. Tongue, uvula, palate midline.  Motor: 5/5 throughout with no pronator drift. Coordination: Normal finger to nose testing. Gait narrow-based and steady  IMPRESSION: This is a pleasant 66 yo RH woman with a history of asthma, hyperlipidemia, hypothyroidism, who initially presented for evaluation of episodic muscle weakness. Her EMG did not show any evidence of myopathy or neuromuscular junction disorder. It did show chronic bilateral L5 radiculopathy, mild. She had an MRI lumbar spine with mild degenerative changes, and note of abnormal signal throughout the posterior elements and L5 vertebral body classic for Paget's disease per Oncology. She was also found to have a kidney mass and will undergo laparoscopic surgery. She states the episodes of weakness have not been happening, etiology remains unclear. Her main concern today is the dizziness where she has to hold on to things, we discussed doing vestibular therapy but she would like to wait until after surgery first. She will follow-up in 6 months and knows to call for any changes.   Thank you for allowing me  to participate in her care.  Please do not hesitate to call for any questions or concerns.  The duration of this appointment visit was 24 minutes of face-to-face time with the patient.  Greater than 50% of this time was spent in counseling, explanation of diagnosis, planning of further management, and coordination of care.   [name]  Delice Lesch, M.D.   CC: Dr. Sharlet Salina, Dr. Alvy Bimler

## 2017-01-24 ENCOUNTER — Encounter: Payer: Self-pay | Admitting: Neurology

## 2017-01-24 DIAGNOSIS — M6281 Muscle weakness (generalized): Secondary | ICD-10-CM | POA: Insufficient documentation

## 2017-01-24 DIAGNOSIS — R42 Dizziness and giddiness: Secondary | ICD-10-CM | POA: Insufficient documentation

## 2017-01-30 ENCOUNTER — Encounter: Payer: Self-pay | Admitting: Internal Medicine

## 2017-01-30 ENCOUNTER — Ambulatory Visit (INDEPENDENT_AMBULATORY_CARE_PROVIDER_SITE_OTHER): Payer: Federal, State, Local not specified - PPO | Admitting: Internal Medicine

## 2017-01-30 ENCOUNTER — Other Ambulatory Visit (INDEPENDENT_AMBULATORY_CARE_PROVIDER_SITE_OTHER): Payer: Federal, State, Local not specified - PPO

## 2017-01-30 VITALS — BP 142/96 | HR 86 | Temp 98.4°F | Ht 63.0 in | Wt 215.0 lb

## 2017-01-30 DIAGNOSIS — E785 Hyperlipidemia, unspecified: Secondary | ICD-10-CM

## 2017-01-30 DIAGNOSIS — E559 Vitamin D deficiency, unspecified: Secondary | ICD-10-CM | POA: Diagnosis not present

## 2017-01-30 DIAGNOSIS — E038 Other specified hypothyroidism: Secondary | ICD-10-CM | POA: Diagnosis not present

## 2017-01-30 DIAGNOSIS — Z0000 Encounter for general adult medical examination without abnormal findings: Secondary | ICD-10-CM

## 2017-01-30 DIAGNOSIS — Z23 Encounter for immunization: Secondary | ICD-10-CM

## 2017-01-30 DIAGNOSIS — K219 Gastro-esophageal reflux disease without esophagitis: Secondary | ICD-10-CM

## 2017-01-30 LAB — COMPREHENSIVE METABOLIC PANEL
ALK PHOS: 116 U/L (ref 39–117)
ALT: 30 U/L (ref 0–35)
AST: 27 U/L (ref 0–37)
Albumin: 4.2 g/dL (ref 3.5–5.2)
BILIRUBIN TOTAL: 0.6 mg/dL (ref 0.2–1.2)
BUN: 18 mg/dL (ref 6–23)
CALCIUM: 9.8 mg/dL (ref 8.4–10.5)
CO2: 28 meq/L (ref 19–32)
Chloride: 103 mEq/L (ref 96–112)
Creatinine, Ser: 0.84 mg/dL (ref 0.40–1.20)
GFR: 72.38 mL/min (ref >60.00)
Glucose, Bld: 95 mg/dL (ref 70–99)
POTASSIUM: 3.9 meq/L (ref 3.5–5.1)
Sodium: 139 mEq/L (ref 135–145)
Total Protein: 7.9 g/dL (ref 6.0–8.3)

## 2017-01-30 LAB — CBC
HCT: 43.3 % (ref 36.0–46.0)
Hemoglobin: 14.7 g/dL (ref 12.0–15.0)
MCHC: 33.9 g/dL (ref 30.0–36.0)
MCV: 88.7 fl (ref 78.0–100.0)
Platelets: 298 10*3/uL (ref 150.0–400.0)
RBC: 4.88 Mil/uL (ref 3.87–5.11)
RDW: 13.4 % (ref 11.5–15.5)
WBC: 7 10*3/uL (ref 4.0–10.5)

## 2017-01-30 LAB — TSH: TSH: 5.87 u[IU]/mL — AB (ref 0.35–4.50)

## 2017-01-30 LAB — HEMOGLOBIN A1C: Hgb A1c MFr Bld: 6 % (ref 4.6–6.5)

## 2017-01-30 LAB — VITAMIN D 25 HYDROXY (VIT D DEFICIENCY, FRACTURES): VITD: 50.71 ng/mL (ref 30.00–100.00)

## 2017-01-30 LAB — T4, FREE: FREE T4: 0.9 ng/dL (ref 0.60–1.60)

## 2017-01-30 NOTE — Assessment & Plan Note (Signed)
Checking TSH and free T4 and adjust synthroid 50 mcg daily.  

## 2017-01-30 NOTE — Assessment & Plan Note (Signed)
Last lipid panel at goal and taking simvastatin.

## 2017-01-30 NOTE — Assessment & Plan Note (Signed)
Doing well on protonix and gets symptoms if she misses a dose. Okay to continue.

## 2017-01-30 NOTE — Progress Notes (Signed)
   Subjective:    Patient ID: Holly Obrien, female    DOB: 1951/11/03, 66 y.o.   MRN: [id number]  HPI The patient is a 66 YO female coming in for wellness. No new concerns. Having partial nephrectomy later this month.   PMH, [facility], social history reviewed and updated.   Review of Systems  Constitutional: Negative for activity change, appetite change, fatigue, fever and unexpected weight change.  HENT: Negative.   Eyes: Negative.   Respiratory: Negative for cough, chest tightness and shortness of breath.   Cardiovascular: Negative for chest pain, palpitations and leg swelling.  Gastrointestinal: Negative for abdominal distention, abdominal pain, constipation, diarrhea, nausea and vomiting.  Musculoskeletal: Negative.   Skin: Negative.   Neurological: Positive for dizziness and weakness. Negative for seizures, facial asymmetry, numbness and headaches.  Psychiatric/Behavioral: Negative.       Objective:   Physical Exam  Constitutional: She is oriented to person, place, and time. She appears well-developed and well-nourished.  HENT:  Head: Normocephalic and atraumatic.  Eyes: EOM are normal.  lens  Neck: Normal range of motion.  Cardiovascular: Normal rate and regular rhythm.   Pulmonary/Chest: Effort normal and breath sounds normal. No respiratory distress. She has no wheezes. She has no rales.  Abdominal: Soft. Bowel sounds are normal. She exhibits no distension. There is no tenderness. There is no rebound.  Musculoskeletal: She exhibits no edema.  Neurological: She is alert and oriented to person, place, and time. Coordination normal.  Skin: Skin is warm and dry.  Psychiatric: She has a normal mood and affect.   Vitals:   01/30/17 0923 01/30/17 1136  BP: (!) 160/110 (!) 142/96  Pulse: 86   Temp: 98.4 F (36.9 C)   TempSrc: Oral   SpO2: 96%   Weight: 215 lb (97.5 kg)   Height: 5\' 3"  (1.6 m)       Assessment & Plan:

## 2017-01-30 NOTE — Progress Notes (Signed)
Pre visit review using our clinic review tool, if applicable. No additional management support is needed unless otherwise documented below in the visit note. 

## 2017-01-30 NOTE — Assessment & Plan Note (Signed)
Checking vitamin D and adjust as needed. 

## 2017-01-30 NOTE — Patient Instructions (Signed)
We have given you the tetanus shot today.   We are checking the labs and will call you back with the results.  Health Maintenance, Female Adopting a healthy lifestyle and getting preventive care can go a long way to promote health and wellness. Talk with your health care provider about what schedule of regular examinations is right for you. This is a good chance for you to check in with your provider about disease prevention and staying healthy. In between checkups, there are plenty of things you can do on your own. Experts have done a lot of research about which lifestyle changes and preventive measures are most likely to keep you healthy. Ask your health care provider for more information. Weight and diet Eat a healthy diet  Be sure to include plenty of vegetables, fruits, low-fat dairy products, and lean protein.  Do not eat a lot of foods high in solid fats, added sugars, or salt.  Get regular exercise. This is one of the most important things you can do for your health.  Most adults should exercise for at least 150 minutes each week. The exercise should increase your heart rate and make you sweat (moderate-intensity exercise).  Most adults should also do strengthening exercises at least twice a week. This is in addition to the moderate-intensity exercise. Maintain a healthy weight  Body mass index (BMI) is a measurement that can be used to identify possible weight problems. It estimates body fat based on height and weight. Your health care provider can help determine your BMI and help you achieve or maintain a healthy weight.  For females 93 years of age and older:  A BMI below 18.5 is considered underweight.  A BMI of 18.5 to 24.9 is normal.  A BMI of 25 to 29.9 is considered overweight.  A BMI of 30 and above is considered obese. Watch levels of cholesterol and blood lipids  You should start having your blood tested for lipids and cholesterol at 65 years of age, then have  this test every 5 years.  You may need to have your cholesterol levels checked more often if:  Your lipid or cholesterol levels are high.  You are older than 65 years of age.  You are at high risk for heart disease. Cancer screening Lung Cancer  Lung cancer screening is recommended for adults 76-11 years old who are at high risk for lung cancer because of a history of smoking.  A yearly low-dose CT scan of the lungs is recommended for people who:  Currently smoke.  Have quit within the past 15 years.  Have at least a 30-pack-year history of smoking. A pack year is smoking an average of one pack of cigarettes a day for 1 year.  Yearly screening should continue until it has been 15 years since you quit.  Yearly screening should stop if you develop a health problem that would prevent you from having lung cancer treatment. Breast Cancer  Practice breast self-awareness. This means understanding how your breasts normally appear and feel.  It also means doing regular breast self-exams. Let your health care provider know about any changes, no matter how small.  If you are in your 20s or 30s, you should have a clinical breast exam (CBE) by a health care provider every 1-3 years as part of a regular health exam.  If you are 1 or older, have a CBE every year. Also consider having a breast X-ray (mammogram) every year.  If you have a family  history of breast cancer, talk to your health care provider about genetic screening.  If you are at high risk for breast cancer, talk to your health care provider about having an MRI and a mammogram every year.  Breast cancer gene (BRCA) assessment is recommended for women who have family members with BRCA-related cancers. BRCA-related cancers include:  Breast.  Ovarian.  Tubal.  Peritoneal cancers.  Results of the assessment will determine the need for genetic counseling and BRCA1 and BRCA2 testing. Cervical Cancer  Your health care  provider may recommend that you be screened regularly for cancer of the pelvic organs (ovaries, uterus, and vagina). This screening involves a pelvic examination, including checking for microscopic changes to the surface of your cervix (Pap test). You may be encouraged to have this screening done every 3 years, beginning at age 76.  For women ages 39-65, health care providers may recommend pelvic exams and Pap testing every 3 years, or they may recommend the Pap and pelvic exam, combined with testing for human papilloma virus (HPV), every 5 years. Some types of HPV increase your risk of cervical cancer. Testing for HPV may also be done on women of any age with unclear Pap test results.  Other health care providers may not recommend any screening for nonpregnant women who are considered low risk for pelvic cancer and who do not have symptoms. Ask your health care provider if a screening pelvic exam is right for you.  If you have had past treatment for cervical cancer or a condition that could lead to cancer, you need Pap tests and screening for cancer for at least 20 years after your treatment. If Pap tests have been discontinued, your risk factors (such as having a new sexual partner) need to be reassessed to determine if screening should resume. Some women have medical problems that increase the chance of getting cervical cancer. In these cases, your health care provider may recommend more frequent screening and Pap tests. Colorectal Cancer  This type of cancer can be detected and often prevented.  Routine colorectal cancer screening usually begins at 65 years of age and continues through 65 years of age.  Your health care provider may recommend screening at an earlier age if you have risk factors for colon cancer.  Your health care provider may also recommend using home test kits to check for hidden blood in the stool.  A small camera at the end of a tube can be used to examine your colon directly  (sigmoidoscopy or colonoscopy). This is done to check for the earliest forms of colorectal cancer.  Routine screening usually begins at age 3.  Direct examination of the colon should be repeated every 5-10 years through 65 years of age. However, you may need to be screened more often if early forms of precancerous polyps or small growths are found. Skin Cancer  Check your skin from head to toe regularly.  Tell your health care provider about any new moles or changes in moles, especially if there is a change in a mole's shape or color.  Also tell your health care provider if you have a mole that is larger than the size of a pencil eraser.  Always use sunscreen. Apply sunscreen liberally and repeatedly throughout the day.  Protect yourself by wearing long sleeves, pants, a wide-brimmed hat, and sunglasses whenever you are outside. Heart disease, diabetes, and high blood pressure  High blood pressure causes heart disease and increases the risk of stroke. High blood pressure  is more likely to develop in:  People who have blood pressure in the high end of the normal range (130-139/85-89 mm Hg).  People who are overweight or obese.  People who are African American.  If you are 19-4 years of age, have your blood pressure checked every 3-5 years. If you are 76 years of age or older, have your blood pressure checked every year. You should have your blood pressure measured twice-once when you are at a hospital or clinic, and once when you are not at a hospital or clinic. Record the average of the two measurements. To check your blood pressure when you are not at a hospital or clinic, you can use:  An automated blood pressure machine at a pharmacy.  A home blood pressure monitor.  If you are between 63 years and 89 years old, ask your health care provider if you should take aspirin to prevent strokes.  Have regular diabetes screenings. This involves taking a blood sample to check your  fasting blood sugar level.  If you are at a normal weight and have a low risk for diabetes, have this test once every three years after 65 years of age.  If you are overweight and have a high risk for diabetes, consider being tested at a younger age or more often. Preventing infection Hepatitis B  If you have a higher risk for hepatitis B, you should be screened for this virus. You are considered at high risk for hepatitis B if:  You were born in a country where hepatitis B is common. Ask your health care provider which countries are considered high risk.  Your parents were born in a high-risk country, and you have not been immunized against hepatitis B (hepatitis B vaccine).  You have HIV or AIDS.  You use needles to inject street drugs.  You live with someone who has hepatitis B.  You have had sex with someone who has hepatitis B.  You get hemodialysis treatment.  You take certain medicines for conditions, including cancer, organ transplantation, and autoimmune conditions. Hepatitis C  Blood testing is recommended for:  Everyone born from [birth year] through 1964.  Anyone with known risk factors for hepatitis C. Sexually transmitted infections (STIs)  You should be screened for sexually transmitted infections (STIs) including gonorrhea and chlamydia if:  You are sexually active and are younger than 65 years of age.  You are older than 65 years of age and your health care provider tells you that you are at risk for this type of infection.  Your sexual activity has changed since you were last screened and you are at an increased risk for chlamydia or gonorrhea. Ask your health care provider if you are at risk.  If you do not have HIV, but are at risk, it may be recommended that you take a prescription medicine daily to prevent HIV infection. This is called pre-exposure prophylaxis (PrEP). You are considered at risk if:  You are sexually active and do not regularly use condoms or  know the HIV status of your partner(s).  You take drugs by injection.  You are sexually active with a partner who has HIV. Talk with your health care provider about whether you are at high risk of being infected with HIV. If you choose to begin PrEP, you should first be tested for HIV. You should then be tested every 3 months for as long as you are taking PrEP. Pregnancy  If you are premenopausal and you may become  pregnant, ask your health care provider about preconception counseling.  If you may become pregnant, take 400 to 800 micrograms (mcg) of folic acid every day.  If you want to prevent pregnancy, talk to your health care provider about birth control (contraception). Osteoporosis and menopause  Osteoporosis is a disease in which the bones lose minerals and strength with aging. This can result in serious bone fractures. Your risk for osteoporosis can be identified using a bone density scan.  If you are 27 years of age or older, or if you are at risk for osteoporosis and fractures, ask your health care provider if you should be screened.  Ask your health care provider whether you should take a calcium or vitamin D supplement to lower your risk for osteoporosis.  Menopause may have certain physical symptoms and risks.  Hormone replacement therapy may reduce some of these symptoms and risks. Talk to your health care provider about whether hormone replacement therapy is right for you. Follow these instructions at home:  Schedule regular health, dental, and eye exams.  Stay current with your immunizations.  Do not use any tobacco products including cigarettes, chewing tobacco, or electronic cigarettes.  If you are pregnant, do not drink alcohol.  If you are breastfeeding, limit how much and how often you drink alcohol.  Limit alcohol intake to no more than 1 drink per day for nonpregnant women. One drink equals 12 ounces of beer, 5 ounces of wine, or 1 ounces of hard  liquor.  Do not use street drugs.  Do not share needles.  Ask your health care provider for help if you need support or information about quitting drugs.  Tell your health care provider if you often feel depressed.  Tell your health care provider if you have ever been abused or do not feel safe at home. This information is not intended to replace advice given to you by your health care provider. Make sure you discuss any questions you have with your health care provider. Document Released: 05/28/2011 Document Revised: 04/19/2016 Document Reviewed: 08/16/2015 Elsevier Interactive Patient Education  2017 [employer].

## 2017-01-30 NOTE — Assessment & Plan Note (Signed)
Tdap given at visit. Shingles done last year and we briefly talked about shingrix and she wishes to think about it. Flu and pneumonia up to date. Mammogram and pap smear up to date. Counseled on sun safety and mole surveillance. Counseled on dangers of distracted driving. Given screening recommendations.

## 2017-02-15 ENCOUNTER — Encounter (HOSPITAL_COMMUNITY): Payer: Self-pay

## 2017-02-15 ENCOUNTER — Encounter (HOSPITAL_COMMUNITY)
Admission: RE | Admit: 2017-02-15 | Discharge: 2017-02-15 | Disposition: A | Discharge status: Home / Self Care | Payer: Federal, State, Local not specified - PPO | Source: Ambulatory Visit | Attending: Urology | Admitting: Urology

## 2017-02-15 DIAGNOSIS — Z0183 Encounter for blood typing: Secondary | ICD-10-CM | POA: Insufficient documentation

## 2017-02-15 DIAGNOSIS — N2889 Other specified disorders of kidney and ureter: Secondary | ICD-10-CM | POA: Insufficient documentation

## 2017-02-15 HISTORY — DX: Unspecified osteoarthritis, unspecified site: M19.90

## 2017-02-15 HISTORY — DX: Hypothyroidism, unspecified: E03.9

## 2017-02-15 HISTORY — DX: Other complications of anesthesia, initial encounter: T88.59XA

## 2017-02-15 HISTORY — DX: Gastro-esophageal reflux disease without esophagitis: K21.9

## 2017-02-15 HISTORY — DX: Chronic kidney disease, unspecified: N18.9

## 2017-02-15 HISTORY — DX: Adverse effect of unspecified anesthetic, initial encounter: T41.45XA

## 2017-02-15 LAB — ABO/RH: ABO/RH(D): A POS

## 2017-02-15 NOTE — Pre-Procedure Instructions (Signed)
01-30-17 CBC, CMP, HgbA1C (epic) 11-27-16 CT Chest (epic)

## 2017-02-15 NOTE — Patient Instructions (Addendum)
Holly Obrien  02/15/2017   Your procedure is scheduled on: Wednesday 02/20/17  Report to [facility] Main  Entrance take [facility]  elevators to 3rd floor to  [place] at 11:15 AM.  Call this number if you have problems the morning of surgery [phone]   Remember: ONLY 1 PERSON MAY GO WITH YOU TO SHORT STAY TO GET  READY MORNING OF [place].  You may drink clear liquids until 7:15 AM morning of surgury. Do not eat food or drink liquids after 7:15 AM.     Take these medicines the morning of surgery with A SIP OF WATER: Levoxyl, Pantoprazole, Timolol eye drops, Albuterol inhaler if needed, Flonase if needed, Advair Diskus if needed.                                You may not have any metal on your body including hair pins and              piercings  Do not wear jewelry, make-up, lotions, powders or perfumes, deodorant             Do not wear nail polish.  Do not shave  48 hours prior to surgery.              Men may shave face and neck.   Do not bring valuables to the hospital. [place].  Contacts, dentures or bridgework may not be worn into surgery.  Leave suitcase in the car. After surgery it may be brought to your room.               Please read over the following fact sheets you were given: _____________________________________________________________________             [facility] - Preparing for Surgery Before surgery, you can play an important role.  Because skin is not sterile, your skin needs to be as free of germs as possible.  You can reduce the number of germs on your skin by washing with CHG (chlorahexidine gluconate) soap before surgery.  CHG is an antiseptic cleaner which kills germs and bonds with the skin to continue killing germs even after washing. Please DO NOT use if you have an allergy to CHG or antibacterial soaps.  If your skin becomes reddened/irritated stop using the CHG  and inform your nurse when you arrive at Short Stay. Do not shave (including legs and underarms) for at least 48 hours prior to the first CHG shower.  You may shave your face/neck. Please follow these instructions carefully:  1.  Shower with CHG Soap the night before surgery and the  morning of Surgery.  2.  If you choose to wash your hair, wash your hair first as usual with your  normal  shampoo.  3.  After you shampoo, rinse your hair and body thoroughly to remove the  shampoo.                           4.  Use CHG as you would any other liquid soap.  You can apply chg directly  to the skin and wash  Gently with a scrungie or clean washcloth.  5.  Apply the CHG Soap to your body ONLY FROM THE NECK DOWN.   Do not use on face/ open                           Wound or open sores. Avoid contact with eyes, ears mouth and genitals (private parts).                       Wash face,  Genitals (private parts) with your normal soap.             6.  Wash thoroughly, paying special attention to the area where your surgery  will be performed.  7.  Thoroughly rinse your body with warm water from the neck down.  8.  DO NOT shower/wash with your normal soap after using and rinsing off  the CHG Soap.                9.  Pat yourself dry with a clean towel.            10.  Wear clean pajamas.            11.  Place clean sheets on your bed the night of your first shower and do not  sleep with pets. Day of Surgery : Do not apply any lotions/deodorants the morning of surgery.  Please wear clean clothes to the hospital/surgery center.  FAILURE TO FOLLOW THESE INSTRUCTIONS MAY RESULT IN THE CANCELLATION OF YOUR SURGERY PATIENT SIGNATURE_________________________________  NURSE SIGNATURE__________________________________  ________________________________________________________________________    CLEAR LIQUID DIET   Foods Allowed                                                                      Foods Excluded  Coffee and tea, regular and decaf                             liquids that you cannot  Plain Jell-O in any flavor                                             see through such as: Fruit ices (not with fruit pulp)                                     milk, soups, orange juice  Iced Popsicles                                    All solid food Carbonated beverages, regular and diet                                    Cranberry, grape and apple juices Sports drinks like Gatorade Lightly seasoned clear broth or consume(fat free) Sugar, honey syrup  Sample Menu Breakfast                                Lunch                                     Supper Cranberry juice                    Beef broth                            Chicken broth Jell-O                                     Grape juice                           Apple juice Coffee or tea                        Jell-O                                      Popsicle                                                Coffee or tea                        Coffee or tea  _____________________________________________________________________

## 2017-02-20 ENCOUNTER — Inpatient Hospital Stay (HOSPITAL_COMMUNITY): Payer: Federal, State, Local not specified - PPO | Admitting: Certified Registered Nurse Anesthetist

## 2017-02-20 ENCOUNTER — Encounter (HOSPITAL_COMMUNITY): Payer: Self-pay | Admitting: Certified Registered Nurse Anesthetist

## 2017-02-20 ENCOUNTER — Encounter (HOSPITAL_COMMUNITY)
Admission: RE | Disposition: A | Discharge status: Home / Self Care | Payer: Self-pay | Source: Ambulatory Visit | Attending: Urology

## 2017-02-20 ENCOUNTER — Inpatient Hospital Stay (HOSPITAL_COMMUNITY)
Admission: RE | Admit: 2017-02-20 | Discharge: 2017-02-21 | DRG: 658 | Disposition: A | Discharge status: Home / Self Care | Payer: Federal, State, Local not specified - PPO | Source: Ambulatory Visit | Attending: Urology | Admitting: Urology

## 2017-02-20 DIAGNOSIS — H409 Unspecified glaucoma: Secondary | ICD-10-CM | POA: Diagnosis present

## 2017-02-20 DIAGNOSIS — E039 Hypothyroidism, unspecified: Secondary | ICD-10-CM | POA: Diagnosis present

## 2017-02-20 DIAGNOSIS — E669 Obesity, unspecified: Secondary | ICD-10-CM | POA: Diagnosis present

## 2017-02-20 DIAGNOSIS — N2889 Other specified disorders of kidney and ureter: Secondary | ICD-10-CM | POA: Diagnosis present

## 2017-02-20 DIAGNOSIS — D49511 Neoplasm of unspecified behavior of right kidney: Principal | ICD-10-CM | POA: Diagnosis present

## 2017-02-20 DIAGNOSIS — H269 Unspecified cataract: Secondary | ICD-10-CM | POA: Diagnosis present

## 2017-02-20 DIAGNOSIS — E785 Hyperlipidemia, unspecified: Secondary | ICD-10-CM | POA: Diagnosis present

## 2017-02-20 DIAGNOSIS — J45909 Unspecified asthma, uncomplicated: Secondary | ICD-10-CM | POA: Diagnosis present

## 2017-02-20 DIAGNOSIS — K219 Gastro-esophageal reflux disease without esophagitis: Secondary | ICD-10-CM | POA: Diagnosis present

## 2017-02-20 DIAGNOSIS — Z6838 Body mass index (BMI) 38.0-38.9, adult: Secondary | ICD-10-CM | POA: Diagnosis not present

## 2017-02-20 HISTORY — PX: ROBOTIC ASSITED PARTIAL NEPHRECTOMY: SHX6087

## 2017-02-20 LAB — TYPE AND SCREEN
ABO/RH(D): A POS
Antibody Screen: NEGATIVE

## 2017-02-20 LAB — HEMOGLOBIN AND HEMATOCRIT, BLOOD
HCT: 41.1 % (ref 36.0–46.0)
HEMOGLOBIN: 13.8 g/dL (ref 12.0–15.0)

## 2017-02-20 SURGERY — NEPHRECTOMY, PARTIAL, ROBOT-ASSISTED
Anesthesia: General | Site: Renal | Laterality: Right

## 2017-02-20 MED ORDER — DEXAMETHASONE SODIUM PHOSPHATE 10 MG/ML IJ SOLN
INTRAMUSCULAR | Status: AC
  Filled 2017-02-20: qty 1

## 2017-02-20 MED ORDER — LACTATED RINGERS IR SOLN
Status: DC | PRN
  Administered 2017-02-20: 1000 mL

## 2017-02-20 MED ORDER — MEPERIDINE HCL 50 MG/ML IJ SOLN: 6.2500 mg | INTRAMUSCULAR | Status: DC | PRN

## 2017-02-20 MED ORDER — HYDROMORPHONE HCL 1 MG/ML IJ SOLN
0.5000 mg | INTRAMUSCULAR | Status: DC | PRN
  Administered 2017-02-21: 1 mg via INTRAVENOUS
  Filled 2017-02-20: qty 1

## 2017-02-20 MED ORDER — ESMOLOL HCL 100 MG/10ML IV SOLN
INTRAVENOUS | Status: AC
  Filled 2017-02-20: qty 10

## 2017-02-20 MED ORDER — LIDOCAINE 2% (20 MG/ML) 5 ML SYRINGE
INTRAMUSCULAR | Status: AC
  Filled 2017-02-20: qty 5

## 2017-02-20 MED ORDER — PANTOPRAZOLE SODIUM 40 MG PO TBEC
40.0000 mg | DELAYED_RELEASE_TABLET | Freq: Every day | ORAL | Status: DC
  Administered 2017-02-21: 40 mg via ORAL
  Filled 2017-02-20: qty 1

## 2017-02-20 MED ORDER — DEXAMETHASONE SODIUM PHOSPHATE 10 MG/ML IJ SOLN
INTRAMUSCULAR | Status: DC | PRN
  Administered 2017-02-20: 10 mg via INTRAVENOUS

## 2017-02-20 MED ORDER — ACETAMINOPHEN 10 MG/ML IV SOLN
INTRAVENOUS | Status: DC | PRN
  Administered 2017-02-20: 1000 mg via INTRAVENOUS

## 2017-02-20 MED ORDER — PROPOFOL 10 MG/ML IV BOLUS
INTRAVENOUS | Status: AC
  Filled 2017-02-20: qty 20

## 2017-02-20 MED ORDER — ACETAMINOPHEN 10 MG/ML IV SOLN
INTRAVENOUS | Status: AC
  Filled 2017-02-20: qty 100

## 2017-02-20 MED ORDER — HYDROMORPHONE HCL 1 MG/ML IJ SOLN
0.2500 mg | INTRAMUSCULAR | Status: DC | PRN
  Administered 2017-02-20 (×2): 0.5 mg via INTRAVENOUS

## 2017-02-20 MED ORDER — ONDANSETRON HCL 4 MG/2ML IJ SOLN: 4.0000 mg | INTRAMUSCULAR | Status: DC | PRN

## 2017-02-20 MED ORDER — CLINDAMYCIN PHOSPHATE 900 MG/50ML IV SOLN
900.0000 mg | INTRAVENOUS | Status: AC
  Administered 2017-02-20: 900 mg via INTRAVENOUS
  Filled 2017-02-20: qty 50

## 2017-02-20 MED ORDER — LACTATED RINGERS IV SOLN
INTRAVENOUS | Status: DC
  Administered 2017-02-20 (×3): via INTRAVENOUS

## 2017-02-20 MED ORDER — PROMETHAZINE HCL 25 MG/ML IJ SOLN: 6.2500 mg | INTRAMUSCULAR | Status: DC | PRN

## 2017-02-20 MED ORDER — DIPHENHYDRAMINE HCL 12.5 MG/5ML PO ELIX
12.5000 mg | ORAL_SOLUTION | Freq: Four times a day (QID) | ORAL | Status: DC | PRN
  Filled 2017-02-20: qty 10

## 2017-02-20 MED ORDER — SUGAMMADEX SODIUM 200 MG/2ML IV SOLN
INTRAVENOUS | Status: AC
  Filled 2017-02-20: qty 2

## 2017-02-20 MED ORDER — OXYCODONE HCL 5 MG/5ML PO SOLN
5.0000 mg | Freq: Once | ORAL | Status: DC | PRN
  Filled 2017-02-20: qty 5

## 2017-02-20 MED ORDER — SODIUM CHLORIDE 0.9 % IJ SOLN
INTRAMUSCULAR | Status: AC
  Filled 2017-02-20: qty 20

## 2017-02-20 MED ORDER — PROPOFOL 10 MG/ML IV BOLUS
INTRAVENOUS | Status: DC | PRN
  Administered 2017-02-20: 175 mg via INTRAVENOUS
  Administered 2017-02-20: 25 mg via INTRAVENOUS

## 2017-02-20 MED ORDER — STERILE WATER FOR IRRIGATION IR SOLN
Status: DC | PRN
  Administered 2017-02-20: 1000 mL

## 2017-02-20 MED ORDER — BUPIVACAINE LIPOSOME 1.3 % IJ SUSP
20.0000 mL | Freq: Once | INTRAMUSCULAR | Status: AC
  Administered 2017-02-20: 20 mL
  Filled 2017-02-20: qty 20

## 2017-02-20 MED ORDER — SIMVASTATIN 20 MG PO TABS
20.0000 mg | ORAL_TABLET | Freq: Every day | ORAL | Status: DC
  Administered 2017-02-20: 20 mg via ORAL
  Filled 2017-02-20: qty 1

## 2017-02-20 MED ORDER — HYDRALAZINE HCL 20 MG/ML IJ SOLN
INTRAMUSCULAR | Status: AC
  Filled 2017-02-20: qty 1

## 2017-02-20 MED ORDER — OXYCODONE HCL 5 MG PO TABS: 5.0000 mg | ORAL_TABLET | Freq: Once | ORAL | Status: DC | PRN

## 2017-02-20 MED ORDER — LABETALOL HCL 5 MG/ML IV SOLN
INTRAVENOUS | Status: AC
  Filled 2017-02-20: qty 4

## 2017-02-20 MED ORDER — ONDANSETRON HCL 4 MG/2ML IJ SOLN
INTRAMUSCULAR | Status: DC | PRN
  Administered 2017-02-20: 4 mg via INTRAVENOUS

## 2017-02-20 MED ORDER — LEVOTHYROXINE SODIUM 50 MCG PO TABS
50.0000 ug | ORAL_TABLET | Freq: Every day | ORAL | Status: DC
  Administered 2017-02-21: 50 ug via ORAL
  Filled 2017-02-20: qty 1

## 2017-02-20 MED ORDER — OXYCODONE HCL 5 MG PO TABS
5.0000 mg | ORAL_TABLET | ORAL | Status: DC | PRN
  Administered 2017-02-20 – 2017-02-21 (×4): 5 mg via ORAL
  Filled 2017-02-20 (×4): qty 1

## 2017-02-20 MED ORDER — SODIUM CHLORIDE 0.9 % IJ SOLN
INTRAMUSCULAR | Status: AC
  Filled 2017-02-20: qty 10

## 2017-02-20 MED ORDER — SODIUM CHLORIDE 0.9 % IJ SOLN
INTRAMUSCULAR | Status: DC | PRN
  Administered 2017-02-20: 20 mL

## 2017-02-20 MED ORDER — GENTAMICIN SULFATE 40 MG/ML IJ SOLN
5.0000 mg/kg | INTRAVENOUS | Status: AC
  Administered 2017-02-20: 350 mg via INTRAVENOUS
  Filled 2017-02-20: qty 8.75

## 2017-02-20 MED ORDER — MIDAZOLAM HCL 2 MG/2ML IJ SOLN
INTRAMUSCULAR | Status: AC
  Filled 2017-02-20: qty 2

## 2017-02-20 MED ORDER — HYDROMORPHONE HCL 1 MG/ML IJ SOLN
INTRAMUSCULAR | Status: AC
  Filled 2017-02-20: qty 1

## 2017-02-20 MED ORDER — ESMOLOL HCL 100 MG/10ML IV SOLN
INTRAVENOUS | Status: DC | PRN
  Administered 2017-02-20: 20 mg via INTRAVENOUS
  Administered 2017-02-20: 5 mg via INTRAVENOUS

## 2017-02-20 MED ORDER — ROCURONIUM BROMIDE 100 MG/10ML IV SOLN
INTRAVENOUS | Status: DC | PRN
Start: 2017-02-20 — End: 2017-02-20
  Administered 2017-02-20: 50 mg via INTRAVENOUS

## 2017-02-20 MED ORDER — ROCURONIUM BROMIDE 50 MG/5ML IV SOSY
PREFILLED_SYRINGE | INTRAVENOUS | Status: AC
  Filled 2017-02-20: qty 5

## 2017-02-20 MED ORDER — SUGAMMADEX SODIUM 500 MG/5ML IV SOLN
INTRAVENOUS | Status: DC | PRN
  Administered 2017-02-20: 300 mg via INTRAVENOUS

## 2017-02-20 MED ORDER — FENTANYL CITRATE (PF) 100 MCG/2ML IJ SOLN
INTRAMUSCULAR | Status: DC | PRN
  Administered 2017-02-20 (×3): 50 ug via INTRAVENOUS
  Administered 2017-02-20: 150 ug via INTRAVENOUS
  Administered 2017-02-20: 50 ug via INTRAVENOUS
  Administered 2017-02-20: 100 ug via INTRAVENOUS
  Administered 2017-02-20: 50 ug via INTRAVENOUS

## 2017-02-20 MED ORDER — LATANOPROST 0.005 % OP SOLN
1.0000 [drp] | Freq: Every day | OPHTHALMIC | Status: DC
  Administered 2017-02-20: 1 [drp] via OPHTHALMIC
  Filled 2017-02-20: qty 2.5

## 2017-02-20 MED ORDER — LACTATED RINGERS IV SOLN
INTRAVENOUS | Status: DC | PRN
  Administered 2017-02-20: 14:00:00 via INTRAVENOUS

## 2017-02-20 MED ORDER — FENTANYL CITRATE (PF) 250 MCG/5ML IJ SOLN
INTRAMUSCULAR | Status: AC
  Filled 2017-02-20: qty 5

## 2017-02-20 MED ORDER — HYDRALAZINE HCL 20 MG/ML IJ SOLN
INTRAMUSCULAR | Status: DC | PRN
  Administered 2017-02-20 (×2): 5 mg via INTRAVENOUS
  Administered 2017-02-20: 10 mg via INTRAVENOUS

## 2017-02-20 MED ORDER — LABETALOL HCL 5 MG/ML IV SOLN
INTRAVENOUS | Status: DC | PRN
  Administered 2017-02-20 (×3): 5 mg via INTRAVENOUS

## 2017-02-20 MED ORDER — DEXTROSE-NACL 5-0.45 % IV SOLN
INTRAVENOUS | Status: DC
  Administered 2017-02-20 – 2017-02-21 (×2): via INTRAVENOUS

## 2017-02-20 MED ORDER — ACETAMINOPHEN 500 MG PO TABS
1000.0000 mg | ORAL_TABLET | Freq: Four times a day (QID) | ORAL | Status: DC
  Administered 2017-02-20 – 2017-02-21 (×3): 1000 mg via ORAL
  Filled 2017-02-20 (×3): qty 2

## 2017-02-20 MED ORDER — LIDOCAINE HCL (CARDIAC) 20 MG/ML IV SOLN
INTRAVENOUS | Status: DC | PRN
  Administered 2017-02-20: 100 mg via INTRAVENOUS

## 2017-02-20 MED ORDER — ONDANSETRON HCL 4 MG/2ML IJ SOLN
INTRAMUSCULAR | Status: AC
  Filled 2017-02-20: qty 2

## 2017-02-20 MED ORDER — TIMOLOL MALEATE 0.5 % OP SOLN
1.0000 [drp] | Freq: Every day | OPHTHALMIC | Status: DC
  Administered 2017-02-21: 1 [drp] via OPHTHALMIC
  Filled 2017-02-20: qty 5

## 2017-02-20 MED ORDER — MIDAZOLAM HCL 5 MG/5ML IJ SOLN
INTRAMUSCULAR | Status: DC | PRN
  Administered 2017-02-20 (×2): 1 mg via INTRAVENOUS

## 2017-02-20 MED ORDER — DIPHENHYDRAMINE HCL 50 MG/ML IJ SOLN: 12.5000 mg | Freq: Four times a day (QID) | INTRAMUSCULAR | Status: DC | PRN

## 2017-02-20 MED ORDER — MOMETASONE FURO-FORMOTEROL FUM 200-5 MCG/ACT IN AERO
2.0000 | INHALATION_SPRAY | Freq: Two times a day (BID) | RESPIRATORY_TRACT | Status: DC
  Filled 2017-02-20: qty 8.8

## 2017-02-20 MED ORDER — HYDROCODONE-ACETAMINOPHEN 5-325 MG PO TABS: 1.0000 | ORAL_TABLET | Freq: Four times a day (QID) | ORAL | 0 refills | Status: DC | PRN

## 2017-02-20 MED ORDER — ALBUTEROL SULFATE (2.5 MG/3ML) 0.083% IN NEBU
3.0000 mL | INHALATION_SOLUTION | RESPIRATORY_TRACT | Status: DC | PRN
Start: 2017-02-20 — End: 2017-02-21

## 2017-02-20 SURGICAL SUPPLY — 73 items
ADH SKN CLS APL DERMABOND .7 (GAUZE/BANDAGES/DRESSINGS) ×1
AGENT HMST KT MTR STRL THRMB (HEMOSTASIS) ×1
APL ESCP 34 STRL LF DISP (HEMOSTASIS) ×1
APPLICATOR SURGIFLO ENDO (HEMOSTASIS) ×3 IMPLANT
BAG SPEC RTRVL LRG 6X4 10 (ENDOMECHANICALS) ×1
CHLORAPREP W/TINT 26ML (MISCELLANEOUS) ×3 IMPLANT
CLIP LIGATING HEM O LOK PURPLE (MISCELLANEOUS) ×3 IMPLANT
CLIP LIGATING HEMO LOK XL GOLD (MISCELLANEOUS) ×4 IMPLANT
CLIP LIGATING HEMO O LOK GREEN (MISCELLANEOUS) ×8 IMPLANT
CLIP SUT LAPRA TY ABSORB (SUTURE) ×3 IMPLANT
COVER SURGICAL LIGHT HANDLE (MISCELLANEOUS) ×3 IMPLANT
COVER TIP SHEARS 8 DVNC (MISCELLANEOUS) ×1 IMPLANT
COVER TIP SHEARS 8MM DA VINCI (MISCELLANEOUS) ×2
DECANTER SPIKE VIAL GLASS SM (MISCELLANEOUS) ×3 IMPLANT
DERMABOND ADVANCED (GAUZE/BANDAGES/DRESSINGS) ×2
DERMABOND ADVANCED .7 DNX12 (GAUZE/BANDAGES/DRESSINGS) ×1 IMPLANT
DRAIN CHANNEL 15F RND FF 3/16 (WOUND CARE) ×3 IMPLANT
DRAPE ARM DVNC X/XI (DISPOSABLE) ×4 IMPLANT
DRAPE COLUMN DVNC XI (DISPOSABLE) ×1 IMPLANT
DRAPE DA VINCI XI ARM (DISPOSABLE) ×8
DRAPE DA VINCI XI COLUMN (DISPOSABLE) ×2
DRAPE INCISE IOBAN 66X45 STRL (DRAPES) ×3 IMPLANT
DRAPE LAPAROSCOPIC ABDOMINAL (DRAPES) ×3 IMPLANT
DRAPE SHEET LG 3/4 BI-LAMINATE (DRAPES) ×3 IMPLANT
ELECT PENCIL ROCKER SW 15FT (MISCELLANEOUS) ×3 IMPLANT
ELECT REM PT RETURN 15FT ADLT (MISCELLANEOUS) ×3 IMPLANT
EVACUATOR SILICONE 100CC (DRAIN) ×1 IMPLANT
GLOVE BIO SURGEON STRL SZ 6.5 (GLOVE) ×2 IMPLANT
GLOVE BIO SURGEONS STRL SZ 6.5 (GLOVE) ×1
GLOVE BIOGEL M STRL SZ7.5 (GLOVE) ×6 IMPLANT
GOWN STRL REUS W/TWL LRG LVL3 (GOWN DISPOSABLE) ×6 IMPLANT
HEMOSTAT SURGICEL 4X8 (HEMOSTASIS) ×3 IMPLANT
IRRIG SUCT STRYKERFLOW 2 WTIP (MISCELLANEOUS)
IRRIGATION SUCT STRKRFLW 2 WTP (MISCELLANEOUS) IMPLANT
KIT BASIN OR (CUSTOM PROCEDURE TRAY) ×3 IMPLANT
LOOP VESSEL MAXI BLUE (MISCELLANEOUS) ×3 IMPLANT
MARKER SKIN DUAL TIP RULER LAB (MISCELLANEOUS) ×3 IMPLANT
NDL INSUFFLATION 14GA 120MM (NEEDLE) ×1 IMPLANT
NEEDLE INSUFFLATION 14GA 120MM (NEEDLE) ×3 IMPLANT
NS IRRIG 1000ML POUR BTL (IV SOLUTION) ×3 IMPLANT
PORT ACCESS TROCAR AIRSEAL 12 (TROCAR) ×1 IMPLANT
PORT ACCESS TROCAR AIRSEAL 5M (TROCAR) ×2
POSITIONER SURGICAL ARM (MISCELLANEOUS) ×6 IMPLANT
POUCH SPECIMEN RETRIEVAL 10MM (ENDOMECHANICALS) ×3 IMPLANT
RELOAD STAPLE 60 2.6 WHT THN (STAPLE) IMPLANT
RELOAD STAPLER WHITE 60MM (STAPLE) IMPLANT
SEAL CANN UNIV 5-8 DVNC XI (MISCELLANEOUS) ×4 IMPLANT
SEAL XI 5MM-8MM UNIVERSAL (MISCELLANEOUS) ×8
SET TRI-LUMEN FLTR TB AIRSEAL (TUBING) ×3 IMPLANT
SOLUTION ELECTROLUBE (MISCELLANEOUS) ×3 IMPLANT
SPONGE LAP 4X18 X RAY DECT (DISPOSABLE) ×3 IMPLANT
STAPLE ECHEON FLEX 60 POW ENDO (STAPLE) IMPLANT
STAPLER RELOAD WHITE 60MM (STAPLE)
SURGIFLO W/THROMBIN 8M KIT (HEMOSTASIS) ×3 IMPLANT
SUT ETHILON 3 0 PS 1 (SUTURE) ×3 IMPLANT
SUT MNCRL AB 4-0 PS2 18 (SUTURE) ×6 IMPLANT
SUT PDS AB 1 CT1 27 (SUTURE) ×6 IMPLANT
SUT V-LOC BARB 180 2/0GR6 GS22 (SUTURE)
SUT VIC AB 0 CT1 27 (SUTURE) ×12
SUT VIC AB 0 CT1 27XBRD ANTBC (SUTURE) ×4 IMPLANT
SUT VIC AB 2-0 SH 27 (SUTURE) ×6
SUT VIC AB 2-0 SH 27X BRD (SUTURE) ×2 IMPLANT
SUT VLOC BARB 180 ABS3/0GR12 (SUTURE) ×3
SUTURE V-LC BRB 180 2/0GR6GS22 (SUTURE) IMPLANT
SUTURE VLOC BRB 180 ABS3/0GR12 (SUTURE) ×1 IMPLANT
TAPE STRIPS DRAPE STRL (GAUZE/BANDAGES/DRESSINGS) ×3 IMPLANT
TOWEL OR 17X26 10 PK STRL BLUE (TOWEL DISPOSABLE) ×6 IMPLANT
TOWEL OR NON WOVEN STRL DISP B (DISPOSABLE) ×3 IMPLANT
TRAY FOLEY W/METER SILVER 16FR (SET/KITS/TRAYS/PACK) ×3 IMPLANT
TRAY LAPAROSCOPIC (CUSTOM PROCEDURE TRAY) ×3 IMPLANT
TROCAR BLADELESS OPT 5 100 (ENDOMECHANICALS) IMPLANT
TROCAR XCEL 12X100 BLDLESS (ENDOMECHANICALS) ×3 IMPLANT
WATER STERILE IRR 1500ML POUR (IV SOLUTION) ×6 IMPLANT

## 2017-02-20 NOTE — Anesthesia Postprocedure Evaluation (Signed)
Anesthesia Post Note  Patient: ROSALEA WITHROW  Procedure(s) Performed: Procedure(s) (LRB): XI ROBOTIC ASSITED PARTIAL NEPHRECTOMY (Right)  Patient location during evaluation: PACU Anesthesia Type: General Level of consciousness: awake and alert Pain management: pain level controlled Vital Signs Assessment: post-procedure vital signs reviewed and stable Respiratory status: spontaneous breathing, nonlabored ventilation and respiratory function stable Cardiovascular status: blood pressure returned to baseline and stable Postop Assessment: no signs of nausea or vomiting Anesthetic complications: no       Last Vitals:  Vitals:   02/20/17 1730 02/20/17 1739  BP: (!) 111/55 110/62  Pulse: 77 77  Resp: 17 11  Temp:  36.6 C    Last Pain:  Vitals:   02/20/17 1739  TempSrc:   PainSc: 2                  Lynda Rainwater

## 2017-02-20 NOTE — Anesthesia Procedure Notes (Signed)
Procedure Name: Intubation Date/Time: 02/20/2017 1:35 PM Performed by: Candida Peeling RAY Pre-anesthesia Checklist: Patient identified, Emergency Drugs available, Suction available, Timeout performed and Patient being monitored Patient Re-evaluated:Patient Re-evaluated prior to inductionOxygen Delivery Method: Circle system utilized Preoxygenation: Pre-oxygenation with 100% oxygen Intubation Type: IV induction Ventilation: Mask ventilation without difficulty and Oral airway inserted - appropriate to patient size Laryngoscope Size: Mac and 3 Grade View: Grade II Tube type: Oral Tube size: 7.5 mm Number of attempts: 1 Airway Equipment and Method: Stylet Placement Confirmation: ETT inserted through vocal cords under direct vision,  positive ETCO2 and breath sounds checked- equal and bilateral Secured at: 21 cm Tube secured with: Tape Dental Injury: Teeth and Oropharynx as per pre-operative assessment  Difficulty Due To: Difficulty was anticipated, Difficult Airway- due to large tongue, Difficult Airway- due to reduced neck mobility and Difficult Airway- due to anterior larynx Future Recommendations: Recommend- induction with short-acting agent, and alternative techniques readily available Comments: Cords visualized with cricoid pressure/head lift

## 2017-02-20 NOTE — Anesthesia Preprocedure Evaluation (Signed)
Anesthesia Evaluation    Airway Mallampati: II  TM Distance: >3 FB Neck ROM: Full    Dental no notable dental hx.    Pulmonary asthma ,    Pulmonary exam normal breath sounds clear to auscultation       Cardiovascular Normal cardiovascular exam Rhythm:Regular Rate:Normal     Neuro/Psych    GI/Hepatic GERD  ,  Endo/Other  Hypothyroidism   Renal/GU      Musculoskeletal  (+) Arthritis ,   Abdominal (+) + obese,   Peds  Hematology   Anesthesia Other Findings   Reproductive/Obstetrics                             Anesthesia Physical Anesthesia Plan  ASA: II  Anesthesia Plan: General   Post-op Pain Management:    Induction: Intravenous  Airway Management Planned: Oral ETT  Additional Equipment:   Intra-op Plan:   Post-operative Plan: Extubation in OR  Informed Consent: I have reviewed the patients History and Physical, chart, labs and discussed the procedure including the risks, benefits and alternatives for the proposed anesthesia with the patient or authorized representative who has indicated his/her understanding and acceptance.   Dental advisory given  Plan Discussed with: CRNA  Anesthesia Plan Comments:         Anesthesia Quick Evaluation

## 2017-02-20 NOTE — Interval H&P Note (Signed)
History and Physical Interval Note:  02/20/2017 1:01 PM  Holly Obrien  has presented today for surgery, with the diagnosis of RIGHT RENAL MASS  The various methods of treatment have been discussed with the patient and family. After consideration of risks, benefits and other options for treatment, the patient has consented to  Procedure(s): XI ROBOTIC ASSITED PARTIAL NEPHRECTOMY (Right) as a surgical intervention .  The patient's history has been reviewed, patient examined, no change in status, stable for surgery.  I have reviewed the patient's chart and labs.  Questions were answered to the patient's satisfaction.     Beulah Capobianco

## 2017-02-20 NOTE — H&P (Signed)
Holly Obrien is an 66 y.o. female.    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 Mass is mid anterior, solitary, about 60% exophytic, and abuts branch renal vein. 1 artery (early branching) 1 vein right renovascular anatomy. No chest / liver lesions. Cr <1, LFT's normal.   This was found during imaging eval for possible neurologic disease / bone neoplasm after episode transient weakness. Fortunately neurologic and skeletal portion in imaging unremarkable (brain, spine).   PMH sig for HLD, Hypothyroid, obesity, TAH/BSO (benign, pfannenstiel). No ischemic CV disease / blood thinners. Her PCP is Pricilla Holm MD with [facility].  Today "Holly Obrien" is seen to proceed with RIGHT robotic partial nephrecotmy. No interval fevers.    Past Medical History:  Diagnosis Date  . Arthritis   . Asthma    with URI's  . Cataract   . Chronic kidney disease    kidney mass  . Complication of anesthesia    "One time, I had a hard time waking up"  . Esophagitis    HH/ERD  . GERD (gastroesophageal reflux disease)   . Glaucoma   . Hyperlipidemia    NMR 2007  . Hypothyroidism   . Thyroid disease    hypothyroidism    Past Surgical History:  Procedure Laterality Date  . ABDOMINAL HYSTERECTOMY  04/30/88   Hysterectomy and bladder tact-- Dr Molli Hazard  . CARPAL TUNNEL RELEASE  06/18/00   right wrist -- Dr Tamala Fothergill  . FINGER SURGERY  12/10/06   left index finger injury--Dr Theodis Sato  . OOPHORECTOMY  09/11/94, 01/14/96   1995-left ovary removed. 1997 Right ovary removed--Dr Molli Hazard  . PUBOVAGINAL SLING  1997   Dr Karsten Ro  . TRIGGER FINGER RELEASE Right 01/2014  . TRIGGER FINGER RELEASE Left 09/2014    Family History  Problem Relation Age of Onset  . Arthritis Other   . Depression Other   . Thyroid disease Other     hyperthyroidism  . Stroke Other   . Heart  disease Other     CABG  . Diabetes Other     hypoglycemia  . Osteoarthritis Mother   . Hyperlipidemia Mother   . Hypertension Mother   . Thyroid disease Mother   . Heart failure Father   . Cancer Father     mesothelioma  . Heart failure Brother   . Hypertension Brother   . Hypertension Sister    Social History:  reports that she has never smoked. She has never used smokeless tobacco. She reports that she does not drink alcohol or use drugs.  Allergies:  Allergies  Allergen Reactions  . Ciloxan [Ciprofloxacin Hcl] Swelling  . Ciprofloxacin     Rash Ciloxan per eye doctor  . Erythromycin Swelling    Face swelling  . Penicillins     Rash as child; numbness & tickling neck & head  @22   . Cefuroxime Axetil     uncertain  . Sulfonamide Derivatives     ? reaction    No prescriptions prior to admission.    No results found for this or any previous visit (from the past 48 hour(s)). No results found.  Review of Systems  Constitutional: Negative.  Negative for chills and fever.  HENT: Negative.   Eyes: Negative.   Respiratory: Negative.   Cardiovascular: Negative.   Gastrointestinal: Negative.   Genitourinary: Negative for hematuria.  Musculoskeletal:  Negative.   Skin: Negative.   Neurological: Negative.   Endo/Heme/Allergies: Negative.   Psychiatric/Behavioral: Negative.     Last menstrual period 05/01/1987. Physical Exam  Constitutional: She appears well-developed.  HENT:  Head: Normocephalic.  Eyes: Pupils are equal, round, and reactive to light.  Neck: Normal range of motion.  Cardiovascular: Normal rate.   Respiratory: Effort normal.  GI: Soft.  Genitourinary:  Genitourinary Comments: No CVAT  Musculoskeletal: Normal range of motion.  Neurological: She is alert.  Skin: Skin is warm.  Psychiatric: She has a normal mood and affect. Her behavior is normal.     Assessment/Plan  Proceed as planned with RIGHT robotic partial nephrectomy. Risks, benefits,  alternatives, expected peri-op course discussed previously and reiterated today.   Alexis Frock, MD 02/20/2017, 6:39 AM

## 2017-02-20 NOTE — Brief Op Note (Signed)
02/20/2017  3:48 PM  PATIENT:  Holly Obrien  66 y.o. female  PRE-OPERATIVE DIAGNOSIS:  RIGHT RENAL MASS  POST-OPERATIVE DIAGNOSIS:  RIGHT RENAL MASS  PROCEDURE:  Procedure(s): XI ROBOTIC ASSITED PARTIAL NEPHRECTOMY (Right)  SURGEON:  Surgeon(s) and Role:    * Alexis Frock, MD - Primary  PHYSICIAN ASSISTANT:   ASSISTANTS: Clemetine Marker PA   ANESTHESIA:   general  EBL:  Total I/O In: 1800 [I.V.:1800] Out: 350 [Urine:275; Blood:75]  BLOOD ADMINISTERED:none  DRAINS: 1 - JP to bulb, 2 - Foley to gravity   LOCAL MEDICATIONS USED:  MARCAINE     SPECIMEN:  Source of Specimen:  Rt partial nephrectomy  DISPOSITION OF SPECIMEN:  PATHOLOGY  COUNTS:  YES  TOURNIQUET:  * No tourniquets in log *  DICTATION: .Other Dictation: Dictation Number [id number]  PLAN OF CARE: Admit to inpatient   PATIENT DISPOSITION:  PACU - hemodynamically stable.   Delay start of Pharmacological VTE agent (>24hrs) due to surgical blood loss or risk of bleeding: yes

## 2017-02-20 NOTE — Transfer of Care (Signed)
Immediate Anesthesia Transfer of Care Note  Patient: Holly Obrien  Procedure(s) Performed: Procedure(s): XI ROBOTIC ASSITED PARTIAL NEPHRECTOMY (Right)  Patient Location: PACU  Anesthesia Type:General  Level of Consciousness: awake, oriented, patient cooperative, lethargic and responds to stimulation  Airway & Oxygen Therapy: Patient Spontanous Breathing and Patient connected to face mask oxygen  Post-op Assessment: Report given to RN, Post -op Vital signs reviewed and stable and Patient moving all extremities  Post vital signs: Reviewed and stable  Last Vitals:  Vitals:   02/20/17 1144 02/20/17 1607  BP: (!) 152/90   Pulse: 70   Resp: 16   Temp: 36.9 C 36.8 C    Last Pain:  Vitals:   02/20/17 1144  TempSrc: Oral      Patients Stated Pain Goal: 3 (32/44/01 0272)  Complications: No apparent anesthesia complications

## 2017-02-20 NOTE — Discharge Instructions (Signed)

## 2017-02-21 ENCOUNTER — Other Ambulatory Visit: Payer: Self-pay | Admitting: Internal Medicine

## 2017-02-21 ENCOUNTER — Encounter (HOSPITAL_COMMUNITY): Payer: Self-pay | Admitting: Urology

## 2017-02-21 DIAGNOSIS — N2889 Other specified disorders of kidney and ureter: Secondary | ICD-10-CM | POA: Diagnosis present

## 2017-02-21 LAB — BASIC METABOLIC PANEL
ANION GAP: 6 (ref 5–15)
BUN: 11 mg/dL (ref 6–20)
CALCIUM: 8.6 mg/dL — AB (ref 8.9–10.3)
CO2: 27 mmol/L (ref 22–32)
CREATININE: 0.82 mg/dL (ref 0.44–1.00)
Chloride: 106 mmol/L (ref 101–111)
Glucose, Bld: 151 mg/dL — ABNORMAL HIGH (ref 65–99)
Potassium: 4.5 mmol/L (ref 3.5–5.1)
SODIUM: 139 mmol/L (ref 135–145)

## 2017-02-21 LAB — HEMOGLOBIN AND HEMATOCRIT, BLOOD
HEMATOCRIT: 36.8 % (ref 36.0–46.0)
HEMOGLOBIN: 12.2 g/dL (ref 12.0–15.0)

## 2017-02-21 MED ORDER — SODIUM CHLORIDE 0.9% FLUSH: 3.0000 mL | INTRAVENOUS | Status: DC | PRN

## 2017-02-21 MED ORDER — SENNOSIDES-DOCUSATE SODIUM 8.6-50 MG PO TABS: 1.0000 | ORAL_TABLET | Freq: Two times a day (BID) | ORAL | 0 refills | Status: DC

## 2017-02-21 MED ORDER — SODIUM CHLORIDE 0.9 % IV SOLN
250.0000 mL | INTRAVENOUS | Status: DC | PRN
Start: 2017-02-21 — End: 2017-02-21

## 2017-02-21 MED ORDER — DIPHENHYDRAMINE HCL 25 MG PO CAPS: 25.0000 mg | ORAL_CAPSULE | Freq: Four times a day (QID) | ORAL | Status: DC | PRN

## 2017-02-21 MED ORDER — DIPHENHYDRAMINE HCL 25 MG PO CAPS
25.0000 mg | ORAL_CAPSULE | Freq: Once | ORAL | Status: AC
  Administered 2017-02-21: 25 mg via ORAL
  Filled 2017-02-21: qty 1

## 2017-02-21 MED ORDER — SODIUM CHLORIDE 0.9% FLUSH
3.0000 mL | Freq: Two times a day (BID) | INTRAVENOUS | Status: DC
  Administered 2017-02-21: 3 mL via INTRAVENOUS

## 2017-02-21 NOTE — Discharge Summary (Signed)
Physician Discharge Summary  Patient ID: Holly Obrien MRN: [id number] DOB/AGE: [birth year] 66 y.o.  Admit date: 02/20/2017 Discharge date: 02/21/2017  Admission Diagnoses: Right Renal Mass  Discharge Diagnoses: Right Renal mass   Discharged Condition: good  Hospital Course:   Patient had RIGHT robotic partial nephrectomy on 3/28, the day of admission, without acute complication. She was admitted to the 4th floor Urology service post-op. By the afternoon of POD 1 she is ambulatory, pain controlled on PO meds, maintaining PO hydration and felt to be adequate for discharge. Foley removed and she voided w/o difficulty, JP removed as output scant. Final path pending at discharge. Hgb 12.2, Cr <1 at discharge.     Consults: None  Significant Diagnostic Studies: labs: as per above  Treatments: surgery: RIGHT robotic partial nephrectomy on 3/28  Discharge Exam: Blood pressure (!) 115/45, pulse 80, temperature 98.5 F (36.9 C), temperature source Oral, resp. rate 18, height 5\' 3"  (1.6 m), weight 98 kg (216 lb), last menstrual period 05/01/1987, SpO2 94 %. General appearance: alert, cooperative, appears stated age and huband at bedside Eyes: negative Nose: Nares normal. Septum midline. Mucosa normal. No drainage or sinus tenderness. Throat: lips, mucosa, and tongue normal; teeth and gums normal Neck: supple, symmetrical, trachea midline Back: symmetric, no curvature. ROM normal. No CVA tenderness. Resp: non-labored on room air.  Cardio: Nl rate GI: soft, non-tender; bowel sounds normal; no masses,  no organomegaly Extremities: extremities normal, atraumatic, no cyanosis or edema Pulses: 2+ and symmetric Skin: Skin color, texture, turgor normal. No rashes or lesions Lymph nodes: Cervical, supraclavicular, and axillary nodes normal.  Recent incision sites c/d/I. JP removed and dry dressing applied.   Disposition: Final discharge disposition not confirmed   Allergies as of  02/21/2017      Reactions   Ciloxan [ciprofloxacin Hcl] Swelling   Ciprofloxacin    Rash Ciloxan per eye doctor   Erythromycin Swelling   Face swelling   Penicillins    Rash as child; numbness & tickling neck & head  @22    Cefuroxime Axetil    uncertain   Sulfonamide Derivatives    ? reaction      Medication List    STOP taking these medications   aspirin 81 MG tablet   B-COMPLEX/B-12 PO   CALCIUM 600/VITAMIN D3 600-800 MG-UNIT Tabs Generic drug:  Calcium Carb-Cholecalciferol   Coenzyme Q10 300 MG Caps   ECHINACEA-GOLDEN SEAL PO   ESTROVEN MAXIMUM STRENGTH PO   Magnesium 500 MG Caps   Vitamin D3 2000 units Tabs   zinc gluconate 50 MG tablet     TAKE these medications   albuterol 108 (90 Base) MCG/ACT inhaler Commonly known as:  VENTOLIN HFA 1-2 puffs q 4 hrs prn   fluticasone 50 MCG/ACT nasal spray Commonly known as:  FLONASE 1 spray in each nostril twice a day as needed. Use the "crossover" technique as discussed   Fluticasone-Salmeterol 250-50 MCG/DOSE Aepb Commonly known as:  ADVAIR DISKUS Inhale 1 puff into the lungs 2 (two) times daily as needed.   HYDROcodone-acetaminophen 5-325 MG tablet Commonly known as:  NORCO Take 1-2 tablets by mouth every 6 (six) hours as needed for moderate pain or severe pain.   latanoprost 0.005 % ophthalmic solution Commonly known as:  XALATAN Place 1 drop into both eyes at bedtime.   levothyroxine 50 MCG tablet Commonly known as:  SYNTHROID, LEVOTHROID TAKE 1 TABLET (50 MCG TOTAL) BY MOUTH DAILY BEFORE BREAKFAST. REPORTED ON 01/30/2016   pantoprazole 40 MG tablet  Commonly known as:  PROTONIX TAKE 1 TABLET (40 MG TOTAL) BY MOUTH DAILY.   PREMARIN 0.3 MG tablet Generic drug:  estrogens (conjugated) TAKE 1 TABLET BY MOUTH EVERY OTHER DAY   senna-docusate 8.6-50 MG tablet Commonly known as:  Senokot-S Take 1 tablet by mouth 2 (two) times daily. While taking pain meds to prevent constipation.   simvastatin 20 MG  tablet Commonly known as:  ZOCOR Take 1 tablet (20 mg total) by mouth at bedtime.   timolol 0.5 % ophthalmic solution Commonly known as:  BETIMOL Place 1 drop into both eyes daily.      Follow-up Information    Alexis Frock, MD Follow up on 03/07/2017.   Specialty:  Urology Why:  at 10:15 for MD visit. Contact information: [place] [id number] [phone]           Signed: Alexis Frock 02/21/2017, 3:42 PM

## 2017-02-21 NOTE — Care Management Note (Signed)
Case Management Note  Patient Details  Name: Holly Obrien MRN: [id number] Date of Birth: [birth year]  Subjective/Objective: 66 y/o f admitted w/renal mass. From home.                   Action/Plan:d/c home.   Expected Discharge Date:                  Expected Discharge Plan:  Home/Self Care  In-House Referral:     Discharge planning Services  CM Consult  Post Acute Care Choice:    Choice offered to:     DME Arranged:    DME Agency:     HH Arranged:    HH Agency:     Status of Service:  In process, will continue to follow  If discussed at Long Length of Stay Meetings, dates discussed:    Additional Comments:  Dessa Phi, RN 02/21/2017, 3:36 PM

## 2017-02-21 NOTE — Op Note (Signed)
NAME:  Holly Obrien, Holly Obrien                  ACCOUNT NO.:  MEDICAL RECORD NO.:  [id number]  LOCATION:                                 FACILITY:  PHYSICIAN:  Alexis Frock, MD          DATE OF BIRTH:   DATE OF PROCEDURE: 02/20/2017                              OPERATIVE REPORT   PREOPERATIVE DIAGNOSIS:  Small right renal mass.  PROCEDURE:  Robotic-assisted laparoscopic right partial nephrectomy.  ESTIMATED BLOOD LOSS:  50 mL.  COMPLICATION:  None.  SPECIMENS:  Right partial nephrectomy for pathology.  FINDINGS: 1. Single early branching artery, single vein, right renovascular     anatomy. 2. Approximately 50% exophytic right renal mass directly overlying     anterior hilar vessels.  ASSISTANT:  Debbrah Alar, PA.  DRAINS: 1. Jackson-Pratt drain to bulb suction. 2. Foley catheter straight drain.  INDICATION:  Holly Obrien is a 66 year old lady who was found on workup of bone lesions to have an enhancing solid right renal mass, clinically localized, concerning for renal cell carcinoma.  She was referred for consideration of management options which we reviewed including surveillance protocols versus ablative therapies versus surgical extirpation with or without nephron-sparing, and the patient adamantly wished to proceed with right partial nephrectomy with robotic assistance.  Informed consent was obtained and placed in the medical record.  Warm ischemia time: 12 minutes.  PROCEDURE IN DETAIL:  The patient being Holly Obrien.  Procedure being right robotic partial nephrectomy was confirmed.  Procedure was carried out.  Time-out was performed.  Intravenous antibiotics were administered.  General endotracheal anesthesia introduced.  The patient was placed into a right side up full flank position, applying 15 degrees of stable flexion, superior arm elevator, axillary roll, sequential compression devices, bottom leg bent, top leg straight.  Beanbag was used as was taping of  her supraxiphoid chest and her pelvis after Foley catheter had been placed per urethra to straight drain.  Sterile field was created by prepping the patient's entire right flank and abdomen using chlorhexidine gluconate and a high-flow, low-pressure pneumoperitoneum was obtained using Veress technique in the right lower quadrant having passed the aspiration and drop test.  Next, an 8-mm robotic camera port was placed in position approximately 1 handbreadth superolateral to the umbilicus.  Laparoscopic examination of the peritoneal cavity revealed some loose adhesions of the distal ileum cecum junction.  No visceral injury.  Additional ports were placed as follows; right subcostal 8-mm robotic port, right far lateral 8-mm robotic port, 4 fingerbreadths superomedial to the anterior iliac spine. The right inferior paramedian 8-mm robotic port, approximately 1 handbreadth superior to the pubic ramus and two 12-mm assistant port sites in the paramedian location, one 3 fingerbreadths above the camera port and one 3 fingerbreadths below; and finally a 5-mm port in the subxiphoid midline location through which a self-locking grasper was used as a liver retractor was placed to the liver on gentle superior traction.  Robot was docked and passed through the electronic checks. Initial attention was directed at the initial adhesiolysis.  Simple adhesiolysis was performed of some non-dense adhesions between the area of the distal ilium and the cecum, which  was then carefully swept medially.  Lower pole of the kidney area was identified, placed on gentle lateral traction.  Dissection proceeded medial to this.  The gonadal vessels and ureter were encountered.  The gonadal vessels were allowed to fall medially.  The ureter and lower pole of kidney were placed on gentle lateral traction.  Dissection proceeded in this triangle superiorly towards the area of the renal hilum.  The duodenal was encountered  very carefully and kocherized medially so that it would lie medial to the lateral surface of the inferior vena cava.  Renal hilum consisted of a single early branching artery, single vein, renovascular anatomy as anticipated.  The artery pre-bifurcation was mobilized, marked with a vessel loop.  Attention was then directed to identification of the mass.  The mass was encountered as expected in the mid anterior plane.  It lie directly in apposition to the hilar branches.  It was very carefully circumferentially mobilized at its anterior surface superior and inferior.  These areas were scarred.  They were scored using cautery scissors.  The posterior aspect which was in direct apposition to the anterior iliac vessels was then very carefully mobilized away from the anterior hilar vessels.  Warm ischemia was then achieved using 2 bulldog clamps on the artery and partial nephrectomy was performed keeping what appeared to be a small rim of normal renal tissue with the partial nephrectomy specimen in the area where the mass overlie the hilar vessels, of course a more of an enucleation technique was used and this resulted in excellent visual margins around the worrisome mass which was then set aside for later retrieval.  There were several small perforating vessels, one artery and two smaller veins entering the area of mass from the hilar vessels and these were controlled using small laparoscopic clips.  First-layer renorrhaphy was then performed using 3-0 running V-Loc suture reapproximating several smaller venous sinuses.  There was no obvious entrance collecting system.  The bulldog clamps were then released for total warm ischemia time of 12 minutes.  Hemostasis appeared acceptable.  A half-sized bolster was then placed into the partial nephrectomy defect and 2 parenchymal apposition sutures of Vicryl were placed sandwiched between Humalog and lapper ties, which resulted in excellent  parenchymal apposition and complete hemostasis of the partial nephrectomy area.  The vessel loops were removed.  The perirenal fat overlying the area of partial nephrectomy defect was brought in apposition using another 0 Vicryl suture after 10 mL of FloSeal was applied to the partial nephrectomy site and hilum.  All sponge and needle counts were correct. The partial nephrectomy specimen was placed in EndoCatch bag for later retrieval.  The retractor was taken down.  There was no injury noted to the liver.  Robot was then undocked.  A closed suction drain was brought through the previous lateral most robotic port site into the area of the peritoneal cavity.  The superior most 12 mm site was closed at the level of the fascia using Carter-Thomason suture passer and 0 Vicryl.  The specimen was retrieved via the inferior site setting aside for permanent pathology.  It was closed at the level of the fascia using figure-of- eight PDS, followed by reapproximation of Scarpa's with running Vicryl. All incision sites were infiltrated with dilute lyophilized Marcaine and closed at the level of the skin using subcuticular Monocryl, followed by Dermabond, and the procedure terminated.  The patient tolerated the procedure well.  There were no immediate periprocedural complications. The patient was taken  to the postanesthesia care in stable condition.  Please note, surgical assistant, Debbrah Alar, was absolutely crucial for all robotic portions of the procedure today.  She provided invaluable sectioning, retraction, clip application, vascular clamp application without which this would not be possible today.          ______________________________ Alexis Frock, MD     TM/MEDQ  D:  02/20/2017  T:  02/20/2017  Job:  [id number]

## 2017-02-21 NOTE — Progress Notes (Signed)
Went over discharge paperwork with patient and family.  All questions answered.  VSS.  Paperwork given to patient.  Pt wheeled out by NT.

## 2017-04-16 ENCOUNTER — Other Ambulatory Visit: Payer: Self-pay | Admitting: Internal Medicine

## 2017-05-06 IMAGING — MR MR THORACIC SPINE W/O CM
4 of 5 series · 14 of 48 positions shown · non-contrast
Comparison: Comparison made with prior MRI from 11/06/2016

CLINICAL DATA: Follow-up exam for abnormality seen on prior MRI of
the lumbar spine.

EXAM:
MRI CERVICAL and THORACIC  SPINE WITHOUT CONTRAST
TECHNIQUE: Multiplanar and multiecho pulse sequences of the cervical spine, to
include the craniocervical junction and cervicothoracic junction,
and thoracic spine, were obtained without intravenous contrast.

[Series 4: T2 · sagittal · 3.0mm · 0.53mm/px · 5 of 16 slices shown (1 of 2)]
[im 1/16]
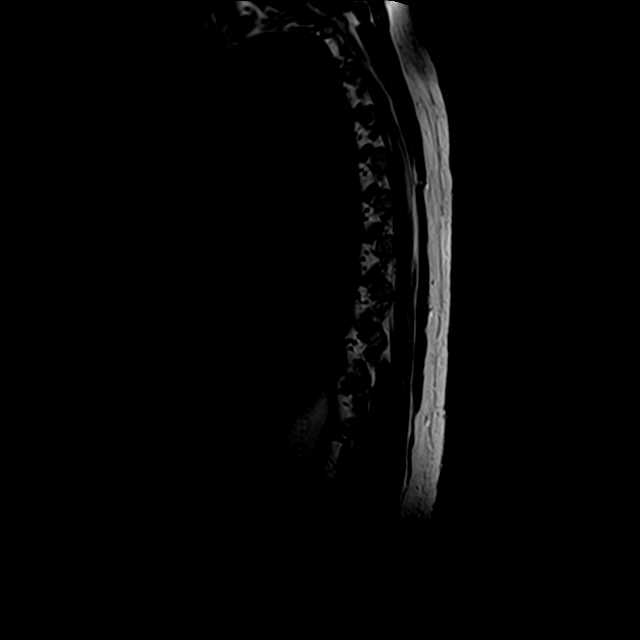
[im 3/16]
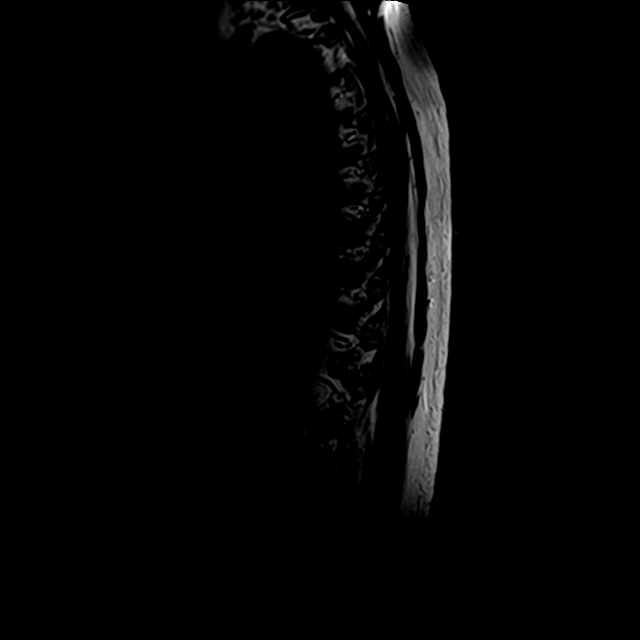
[im 6/16]
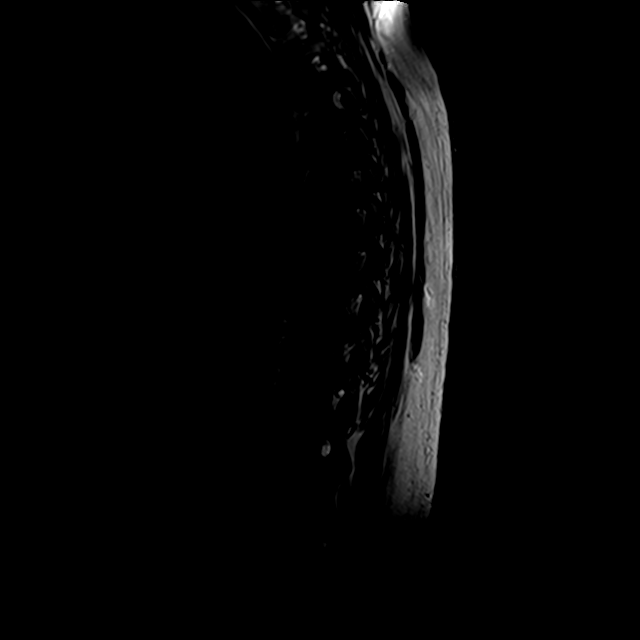
[im 8/16]
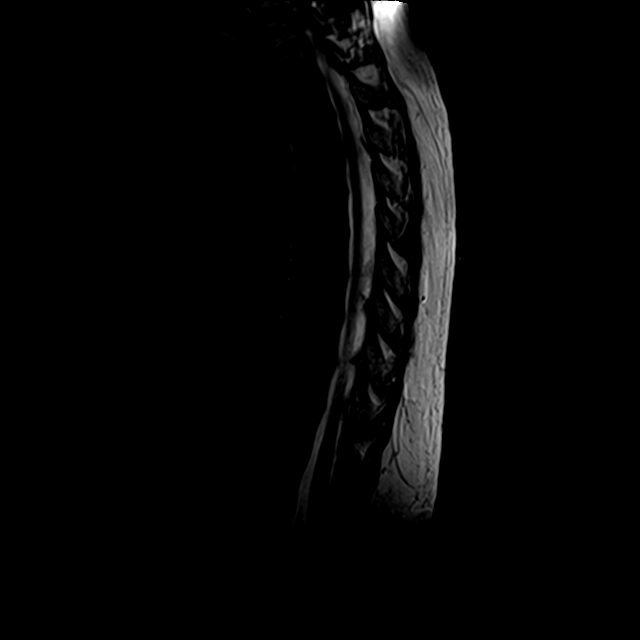
[im 13/16]
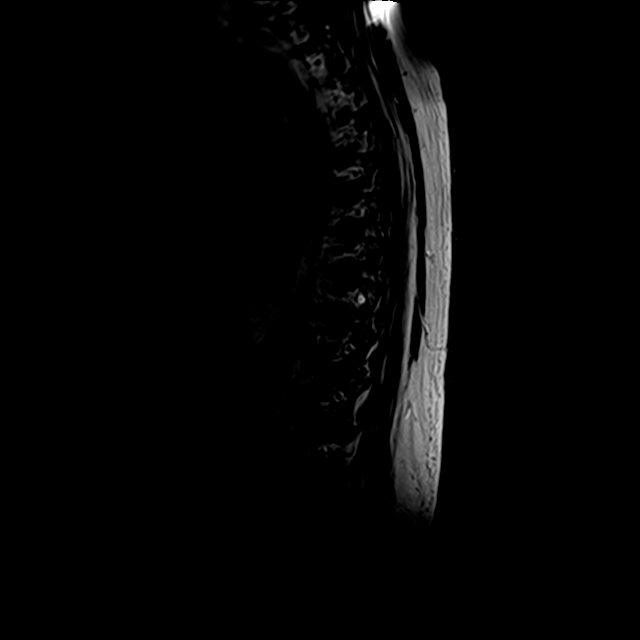

[Series 5: T1 · sagittal · 3.0mm · 0.53mm/px · 3 of 16 slices shown]
[im 3/16]
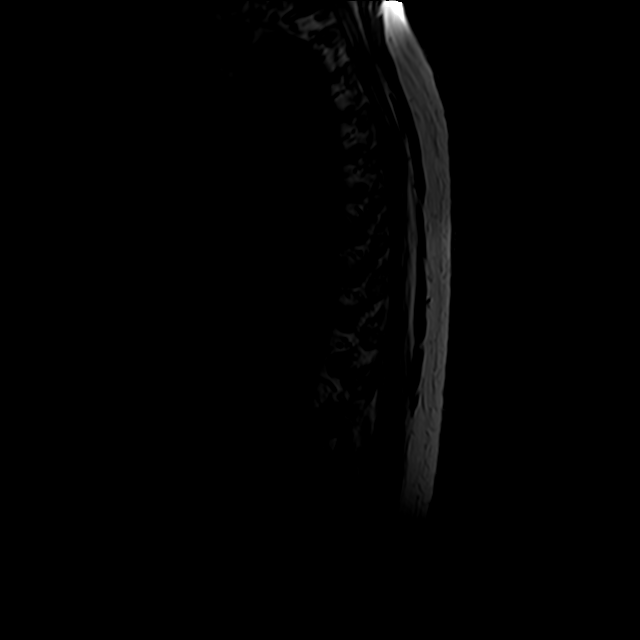
[im 8/16]
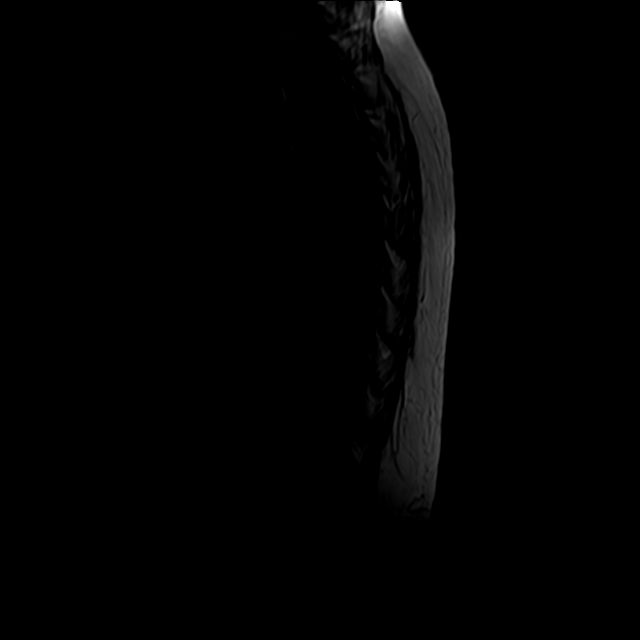
[im 13/16]
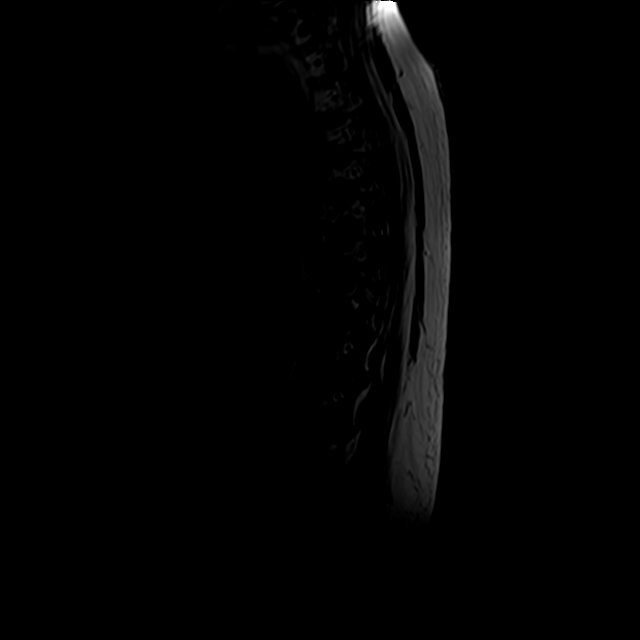

[Series 6: STIR · sagittal · 3.0mm · 1.06mm/px · 3 of 16 slices shown]
[im 4/16]
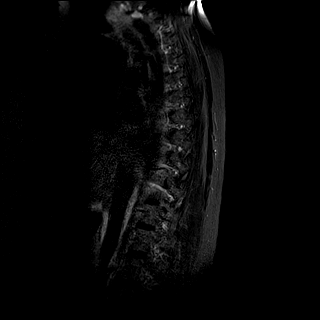
[im 10/16]
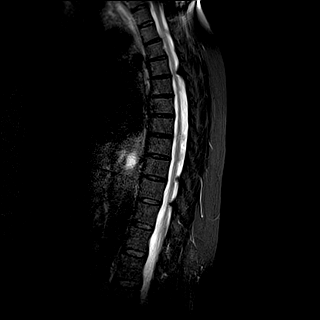
[im 16/16]
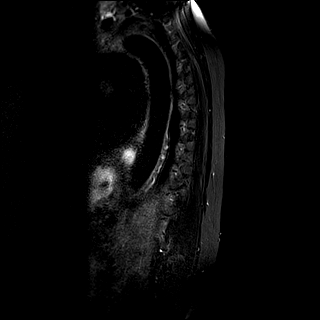

[Series 7: T2 · axial · 3.5mm · 0.39mm/px · z∈[-273,-110]mm · 3 of 36 slices shown (2 of 2)]
[im 6/36]
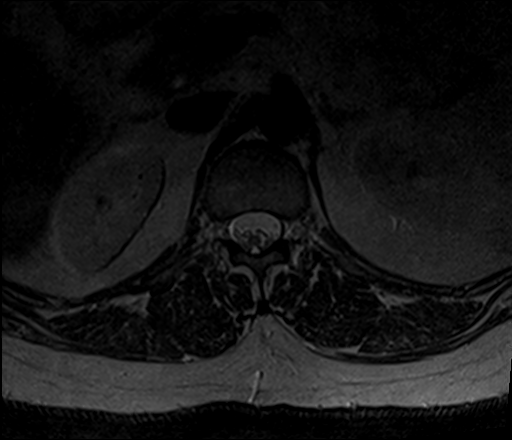
[im 19/36]
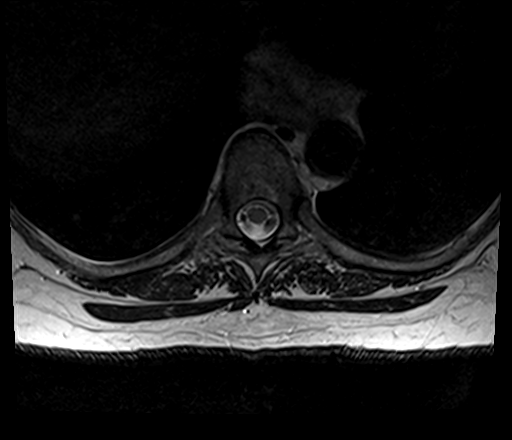
[im 30/36]
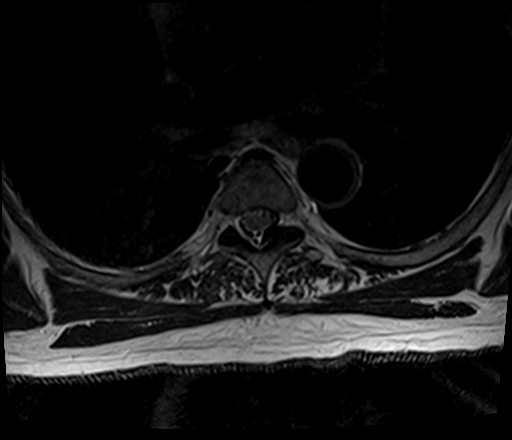

[14 of 48 positions shown; findings below may reference images not displayed]

FINDINGS: MRI CERVICAL SPINE FINDINGS

Alignment: Straightening with slight reversal of the normal cervical
lordosis. Trace anterolisthesis of C4 on C5 and C5 on C6.

Vertebrae: Vertebral body heights are maintained. No evidence for
acute or chronic fracture. Reactive endplate changes noted about the
C6-7 interspace. Signal intensity within the vertebral body bone
marrow is otherwise unremarkable. No abnormal sclerosis or other
concerning osseous lesions identified.

Cord: Signal intensity within the cervical spinal cord is normal.

Posterior Fossa, vertebral arteries, paraspinal tissues: Visualized
portions of the brain and posterior fossa are within normal limits.
Craniocervical junction normal. Paraspinous and prevertebral soft
tissues within normal limits. Normal intravascular flow voids
present within the vertebral arteries bilaterally.

Disc levels:

C2-C3: C2 and C3 vertebral bodies are largely fused, with fusion of
the posterior elements. This is likely congenital in nature. No
stenosis.

C3-C4: Shallow posterior disc bulge, eccentric to the right,
flattening the ventral thecal sac without significant canal
stenosis. Mild bilateral uncovertebral hypertrophy. Bilateral facet
arthrosis. There is resultant mild bilateral foraminal narrowing,
left greater than right.

C4-C5: Diffuse circumferential disc bulge with mild bilateral
uncovertebral hypertrophy. Superimposed small central disc
protrusion indents the ventral thecal sac results in mild canal
stenosis. Mild right foraminal narrowing.

C5-C6: Diffuse circumferential disc bulge with mild uncovertebral
hypertrophy. Mild canal and bilateral foraminal narrowing, slightly
worse on the right.

C6-C7: Diffuse degenerative disc bulge with bilateral uncovertebral
spurring and reactive endplate changes, greater on the left. Left
foraminal/ far lateral disc osteophyte complex results in severe
left foraminal stenosis. More mild right foraminal narrowing.
Posterior disc osteophyte flattens the ventral thecal sac without
significant canal stenosis.

C7-T1: Diffuse disc bulge with mild uncovertebral hypertrophy.
Superimposed central disc protrusion with slight cephalad and caudad
migration. Protruding disc indents the ventral thecal sac without
significant canal stenosis. Mild right foraminal narrowing.

MRI THORACIC SPINE FINDINGS

Alignment: Vertebral bodies normally aligned with preservation of
the normal thoracic kyphosis. No listhesis.

Vertebrae: Vertebral body heights are well preserved. No evidence
for acute or chronic fracture. Signal intensity within the vertebral
body bone marrow is normal. No abnormal sclerosis or worrisome
osseous lesions identified. No abnormal marrow edema.

Cord:  Signal intensity within the thoracic spinal cord is normal.

Paraspinal and other soft tissues: Paraspinous soft tissues within
normal limits. Visualized visceral structures are unremarkable.

Disc levels:

T3-4: Small central disc protrusion indents the ventral thecal sac
without significant stenosis. Mild cord flattening without cord
signal changes.

T4-5: Ligamentum flavum hypertrophy on the left. Small central disc
protrusion indents the ventral thecal sac. Resultant mild canal
stenosis.

T7-8: Small right and left central disc protrusions minimally indent
the ventral thecal sac without significant stenosis.

T8-9: Tiny right central disc protrusion flattens the ventral thecal
sac without significant stenosis.

T9-10: Minimal disc bulge. Posterior element hypertrophy. No
significant stenosis.

T10-11:  Posterior element hypertrophy without stenosis.
IMPRESSION: MRI CERVICAL SPINE IMPRESSION:

1. No abnormal sclerosis or worrisome osseous lesions identified
within the cervical spine.
2. Moderate multilevel degenerative spondylolysis as detailed above,
most notable at C6-7 where a left foraminal/far lateral disc
osteophyte complex results in severe left foraminal stenosis. No
other significant foraminal narrowing within the cervical spine. No
significant canal stenosis.

MRI THORACIC SPINE IMPRESSION:

1. No abnormal sclerosis or worrisome osseous lesions identified
within the thoracic spine.
2. Mild multilevel degenerative spondylolysis as detailed above
without significant stenosis. Please see above report for a full
description of these findings.

## 2017-05-06 IMAGING — MR MR CERVICAL SPINE W/O CM
4 of 5 series · 27 of 48 positions shown · non-contrast
Comparison: Comparison made with prior MRI from 11/06/2016

CLINICAL DATA: Follow-up exam for abnormality seen on prior MRI of
the lumbar spine.

EXAM:
MRI CERVICAL and THORACIC  SPINE WITHOUT CONTRAST
TECHNIQUE: Multiplanar and multiecho pulse sequences of the cervical spine, to
include the craniocervical junction and cervicothoracic junction,
and thoracic spine, were obtained without intravenous contrast.

[Series 3: T2 · sagittal · 3.0mm · 0.66mm/px · 8 of 15 slices shown (1 of 2)]
[im 1/15]
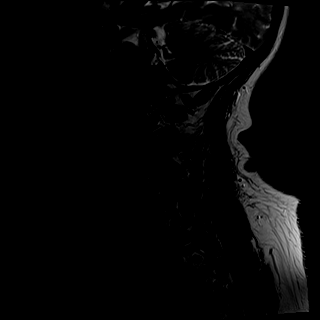
[im 3/15]
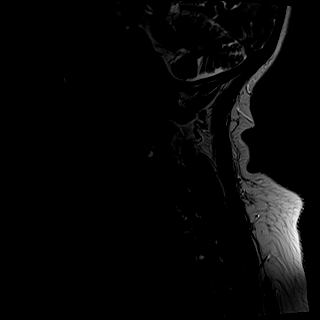
[im 5/15]
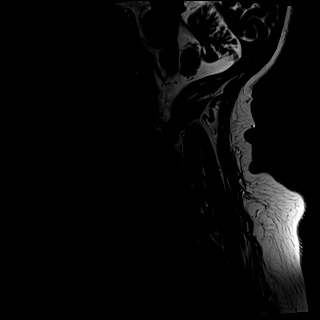
[im 7/15]
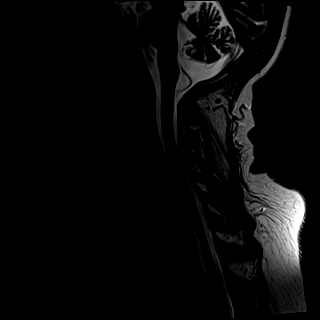
[im 9/15]
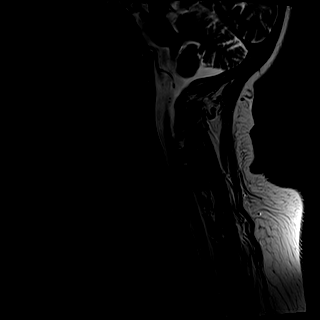
[im 11/15]
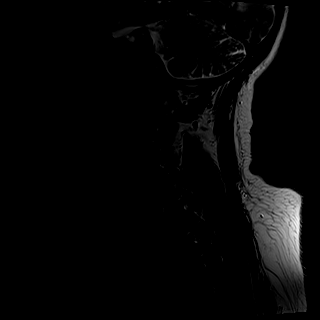
[im 13/15]
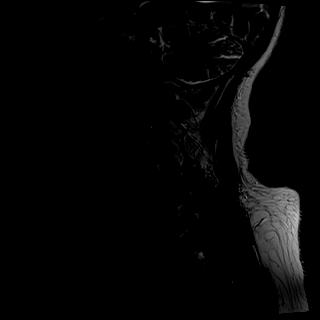
[im 15/15]
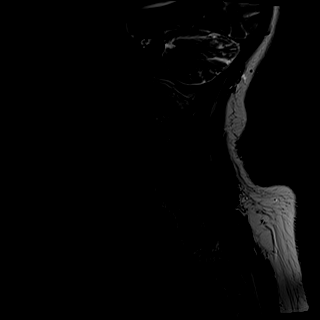

[Series 4: T1 · sagittal · 3.0mm · 0.41mm/px · 7 of 15 slices shown]
[im 1/15]
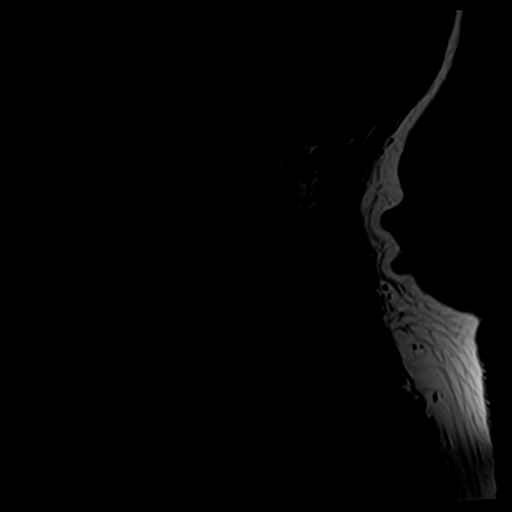
[im 3/15]
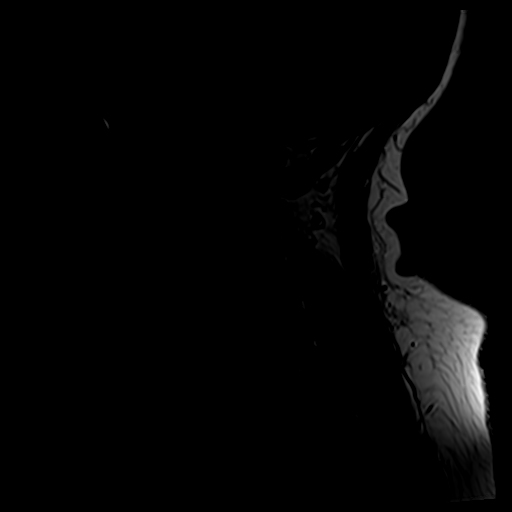
[im 5/15]
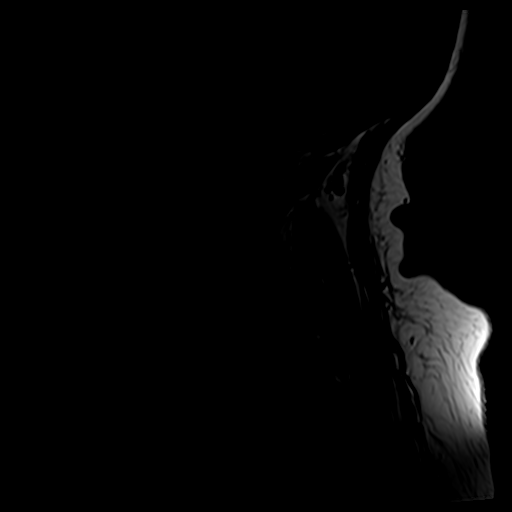
[im 8/15]
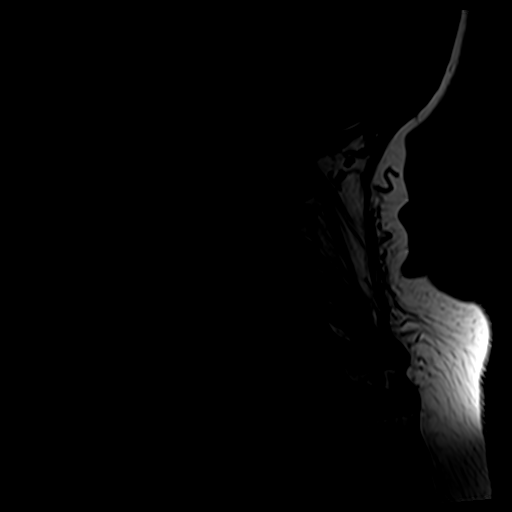
[im 10/15]
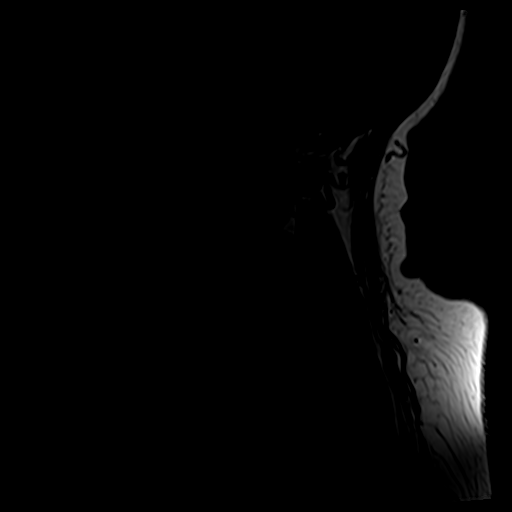
[im 12/15]
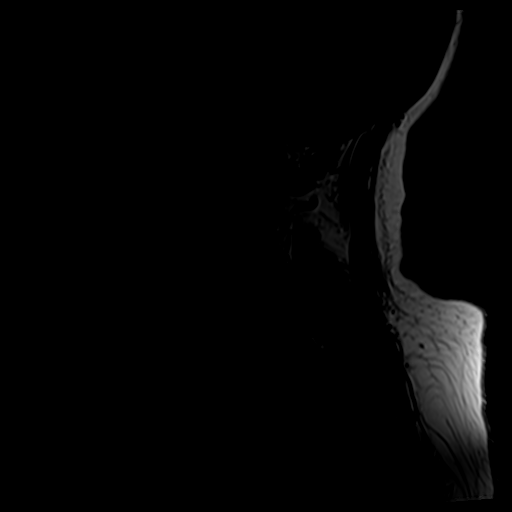
[im 15/15]
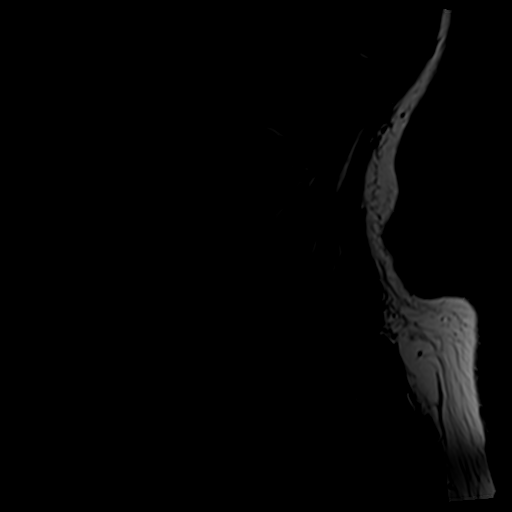

[Series 5: tir sag · sagittal · 3.0mm · 0.41mm/px · 3 of 15 slices shown]
[im 3/15]
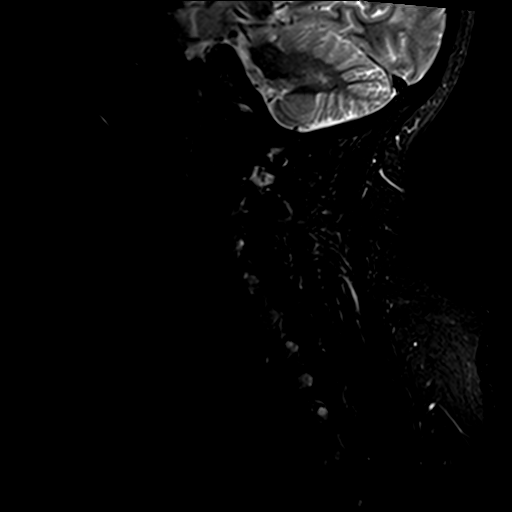
[im 8/15]
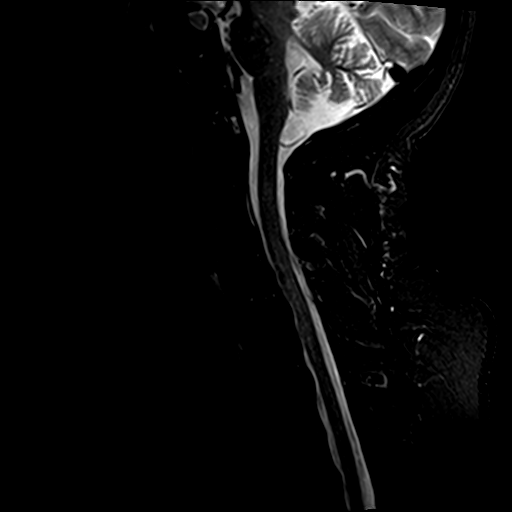
[im 12/15]
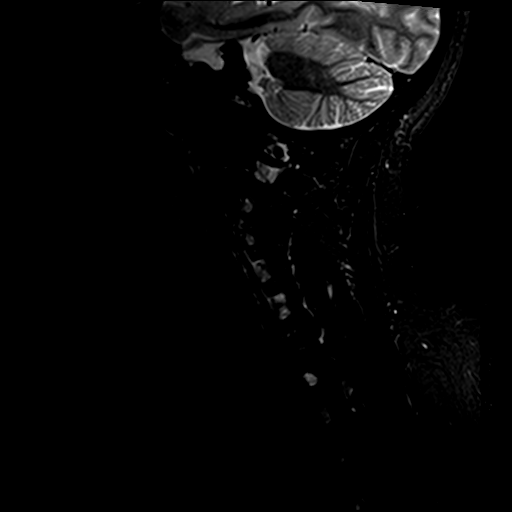

[Series 7: T2 · axial · 3.0mm · 0.70mm/px · z∈[-61,+28]mm · 9 of 26 slices shown (2 of 2)]
[im 1/26]
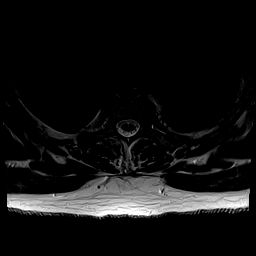
[im 5/26]
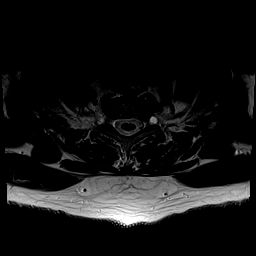
[im 9/26]
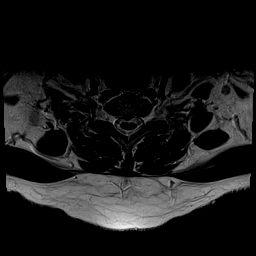
[im 11/26]
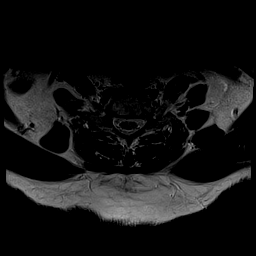
[im 13/26]
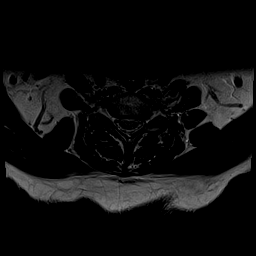
[im 15/26]
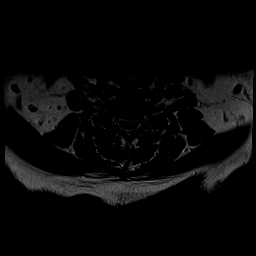
[im 17/26]
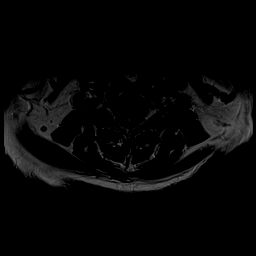
[im 21/26]
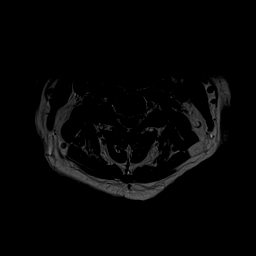
[im 26/26]
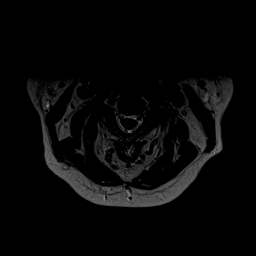

[27 of 48 positions shown; findings below may reference images not displayed]

FINDINGS: MRI CERVICAL SPINE FINDINGS

Alignment: Straightening with slight reversal of the normal cervical
lordosis. Trace anterolisthesis of C4 on C5 and C5 on C6.

Vertebrae: Vertebral body heights are maintained. No evidence for
acute or chronic fracture. Reactive endplate changes noted about the
C6-7 interspace. Signal intensity within the vertebral body bone
marrow is otherwise unremarkable. No abnormal sclerosis or other
concerning osseous lesions identified.

Cord: Signal intensity within the cervical spinal cord is normal.

Posterior Fossa, vertebral arteries, paraspinal tissues: Visualized
portions of the brain and posterior fossa are within normal limits.
Craniocervical junction normal. Paraspinous and prevertebral soft
tissues within normal limits. Normal intravascular flow voids
present within the vertebral arteries bilaterally.

Disc levels:

C2-C3: C2 and C3 vertebral bodies are largely fused, with fusion of
the posterior elements. This is likely congenital in nature. No
stenosis.

C3-C4: Shallow posterior disc bulge, eccentric to the right,
flattening the ventral thecal sac without significant canal
stenosis. Mild bilateral uncovertebral hypertrophy. Bilateral facet
arthrosis. There is resultant mild bilateral foraminal narrowing,
left greater than right.

C4-C5: Diffuse circumferential disc bulge with mild bilateral
uncovertebral hypertrophy. Superimposed small central disc
protrusion indents the ventral thecal sac results in mild canal
stenosis. Mild right foraminal narrowing.

C5-C6: Diffuse circumferential disc bulge with mild uncovertebral
hypertrophy. Mild canal and bilateral foraminal narrowing, slightly
worse on the right.

C6-C7: Diffuse degenerative disc bulge with bilateral uncovertebral
spurring and reactive endplate changes, greater on the left. Left
foraminal/ far lateral disc osteophyte complex results in severe
left foraminal stenosis. More mild right foraminal narrowing.
Posterior disc osteophyte flattens the ventral thecal sac without
significant canal stenosis.

C7-T1: Diffuse disc bulge with mild uncovertebral hypertrophy.
Superimposed central disc protrusion with slight cephalad and caudad
migration. Protruding disc indents the ventral thecal sac without
significant canal stenosis. Mild right foraminal narrowing.

MRI THORACIC SPINE FINDINGS

Alignment: Vertebral bodies normally aligned with preservation of
the normal thoracic kyphosis. No listhesis.

Vertebrae: Vertebral body heights are well preserved. No evidence
for acute or chronic fracture. Signal intensity within the vertebral
body bone marrow is normal. No abnormal sclerosis or worrisome
osseous lesions identified. No abnormal marrow edema.

Cord:  Signal intensity within the thoracic spinal cord is normal.

Paraspinal and other soft tissues: Paraspinous soft tissues within
normal limits. Visualized visceral structures are unremarkable.

Disc levels:

T3-4: Small central disc protrusion indents the ventral thecal sac
without significant stenosis. Mild cord flattening without cord
signal changes.

T4-5: Ligamentum flavum hypertrophy on the left. Small central disc
protrusion indents the ventral thecal sac. Resultant mild canal
stenosis.

T7-8: Small right and left central disc protrusions minimally indent
the ventral thecal sac without significant stenosis.

T8-9: Tiny right central disc protrusion flattens the ventral thecal
sac without significant stenosis.

T9-10: Minimal disc bulge. Posterior element hypertrophy. No
significant stenosis.

T10-11:  Posterior element hypertrophy without stenosis.
IMPRESSION: MRI CERVICAL SPINE IMPRESSION:

1. No abnormal sclerosis or worrisome osseous lesions identified
within the cervical spine.
2. Moderate multilevel degenerative spondylolysis as detailed above,
most notable at C6-7 where a left foraminal/far lateral disc
osteophyte complex results in severe left foraminal stenosis. No
other significant foraminal narrowing within the cervical spine. No
significant canal stenosis.

MRI THORACIC SPINE IMPRESSION:

1. No abnormal sclerosis or worrisome osseous lesions identified
within the thoracic spine.
2. Mild multilevel degenerative spondylolysis as detailed above
without significant stenosis. Please see above report for a full
description of these findings.

## 2017-05-17 ENCOUNTER — Other Ambulatory Visit: Payer: Self-pay | Admitting: Internal Medicine

## 2017-05-17 DIAGNOSIS — E785 Hyperlipidemia, unspecified: Secondary | ICD-10-CM

## 2017-05-17 NOTE — Telephone Encounter (Signed)
Routing to greg, note on rx says patient must transition care to new pcp for future refills----are you ok with refill, please advise, thanks

## 2017-06-11 ENCOUNTER — Telehealth: Payer: Self-pay | Admitting: Certified Nurse Midwife

## 2017-06-11 ENCOUNTER — Encounter: Payer: Self-pay | Admitting: Nurse Practitioner

## 2017-06-11 ENCOUNTER — Ambulatory Visit (INDEPENDENT_AMBULATORY_CARE_PROVIDER_SITE_OTHER): Payer: Federal, State, Local not specified - PPO | Admitting: Nurse Practitioner

## 2017-06-11 VITALS — BP 122/76 | HR 86 | Temp 98.2°F | Ht 63.0 in | Wt 211.0 lb

## 2017-06-11 DIAGNOSIS — J4521 Mild intermittent asthma with (acute) exacerbation: Secondary | ICD-10-CM

## 2017-06-11 DIAGNOSIS — J0140 Acute pansinusitis, unspecified: Secondary | ICD-10-CM | POA: Diagnosis not present

## 2017-06-11 MED ORDER — FLUTICASONE-SALMETEROL 250-50 MCG/DOSE IN AEPB: 1.0000 | INHALATION_SPRAY | Freq: Two times a day (BID) | RESPIRATORY_TRACT | 2 refills | Status: DC | PRN

## 2017-06-11 MED ORDER — GUAIFENESIN ER 600 MG PO TB12: 600.0000 mg | ORAL_TABLET | Freq: Two times a day (BID) | ORAL | 0 refills | Status: DC | PRN

## 2017-06-11 MED ORDER — IPRATROPIUM-ALBUTEROL 0.5-2.5 (3) MG/3ML IN SOLN
3.0000 mL | Freq: Once | RESPIRATORY_TRACT | Status: AC
  Administered 2017-06-11: 3 mL via RESPIRATORY_TRACT

## 2017-06-11 MED ORDER — ALBUTEROL SULFATE HFA 108 (90 BASE) MCG/ACT IN AERS: INHALATION_SPRAY | RESPIRATORY_TRACT | 2 refills | Status: DC

## 2017-06-11 MED ORDER — DOXYCYCLINE HYCLATE 100 MG PO TABS: 100.0000 mg | ORAL_TABLET | Freq: Two times a day (BID) | ORAL | 0 refills | Status: AC

## 2017-06-11 MED ORDER — HYDROCODONE-HOMATROPINE 5-1.5 MG/5ML PO SYRP: 5.0000 mL | ORAL_SOLUTION | Freq: Every evening | ORAL | 0 refills | Status: DC | PRN

## 2017-06-11 MED ORDER — IPRATROPIUM BROMIDE 0.03 % NA SOLN: 2.0000 | Freq: Two times a day (BID) | NASAL | 0 refills | Status: DC

## 2017-06-11 MED ORDER — METHYLPREDNISOLONE ACETATE 40 MG/ML IJ SUSP
40.0000 mg | Freq: Once | INTRAMUSCULAR | Status: AC
  Administered 2017-06-11: 40 mg via INTRAMUSCULAR

## 2017-06-11 NOTE — Patient Instructions (Addendum)
Encourage adequate oral hydration.  Start doxycycline if no improvement in 2days.

## 2017-06-11 NOTE — Telephone Encounter (Signed)
LMTCB/:NP/ .CX/LETTER SENT/RD

## 2017-06-11 NOTE — Progress Notes (Signed)
Subjective:  Patient ID: Holly Obrien, female    DOB: [birth year]  Age: 66 y.o. MRN: [id number]  CC: Cough (coughing colorful/blood mucus/congestion,hear stop up/nose pain--1 wk-- had sinus infection last week--went to urgent care--abx and cough med)   Cough  This is a new problem. The current episode started in the past 7 days. The problem has been waxing and waning. The cough is productive of purulent sputum. Associated symptoms include ear congestion, ear pain, nasal congestion, postnasal drip, rhinorrhea, a sore throat, shortness of breath and wheezing. Pertinent negatives include no chest pain or chills. The symptoms are aggravated by lying down and cold air. She has tried a beta-agonist inhaler, OTC cough suppressant, prescription cough suppressant and steroid inhaler (and azithromycin) for the symptoms. Her past medical history is significant for asthma and environmental allergies.    Outpatient Medications Prior to Visit  Medication Sig Dispense Refill  . fluticasone (FLONASE) 50 MCG/ACT nasal spray 1 spray in each nostril twice a day as needed. Use the "crossover" technique as discussed 16 g 2  . latanoprost (XALATAN) 0.005 % ophthalmic solution Place 1 drop into both eyes at bedtime.   3  . levothyroxine (SYNTHROID, LEVOTHROID) 50 MCG tablet TAKE 1 TABLET (50 MCG TOTAL) BY MOUTH DAILY BEFORE BREAKFAST. REPORTED ON 01/30/2016 90 tablet 3  . pantoprazole (PROTONIX) 40 MG tablet TAKE 1 TABLET (40 MG TOTAL) BY MOUTH DAILY. 30 tablet 3  . PREMARIN 0.3 MG tablet TAKE 1 TABLET BY MOUTH EVERY OTHER DAY 90 tablet 3  . simvastatin (ZOCOR) 20 MG tablet TAKE 1 TABLET (20 MG TOTAL) BY MOUTH AT BEDTIME. 90 tablet 0  . timolol (BETIMOL) 0.5 % ophthalmic solution Place 1 drop into both eyes daily.    Marland Kitchen albuterol (VENTOLIN HFA) 108 (90 BASE) MCG/ACT inhaler 1-2 puffs q 4 hrs prn 18 g 2  . Fluticasone-Salmeterol (ADVAIR DISKUS) 250-50 MCG/DOSE AEPB Inhale 1 puff into the lungs 2 (two) times daily as  needed. 60 each 2  . HYDROcodone-acetaminophen (NORCO) 5-325 MG tablet Take 1-2 tablets by mouth every 6 (six) hours as needed for moderate pain or severe pain. (Patient not taking: Reported on 06/11/2017) 30 tablet 0  . senna-docusate (SENOKOT-S) 8.6-50 MG tablet Take 1 tablet by mouth 2 (two) times daily. While taking pain meds to prevent constipation. (Patient not taking: Reported on 06/11/2017) 30 tablet 0   No facility-administered medications prior to visit.     ROS See HPI  Objective:  BP 122/76   Pulse 86   Temp 98.2 F (36.8 C)   Ht 5\' 3"  (1.6 m)   Wt 211 lb (95.7 kg)   LMP 05/01/1987   SpO2 96%   BMI 37.38 kg/m   BP Readings from Last 3 Encounters:  06/11/17 122/76  02/21/17 (!) 115/45  02/15/17 (!) 156/81    Wt Readings from Last 3 Encounters:  06/11/17 211 lb (95.7 kg)  02/20/17 216 lb (98 kg)  02/15/17 216 lb (98 kg)    Physical Exam  Constitutional: She is oriented to person, place, and time. No distress.  HENT:  Right Ear: External ear and ear canal normal. A middle ear effusion is present.  Left Ear: External ear and ear canal normal. A middle ear effusion is present.  Nose: Mucosal edema and rhinorrhea present. Right sinus exhibits maxillary sinus tenderness and frontal sinus tenderness. Left sinus exhibits maxillary sinus tenderness and frontal sinus tenderness.  Mouth/Throat: Uvula is midline. No trismus in the jaw. Posterior oropharyngeal erythema  present. No oropharyngeal exudate.  Eyes: No scleral icterus.  Neck: Normal range of motion. Neck supple.  Cardiovascular: Normal rate and normal heart sounds.   Pulmonary/Chest: Effort normal and breath sounds normal.  Musculoskeletal: She exhibits no edema.  Lymphadenopathy:    She has no cervical adenopathy.  Neurological: She is alert and oriented to person, place, and time.  Vitals reviewed.   Lab Results  Component Value Date   WBC 7.0 01/30/2017   HGB 12.2 02/21/2017   HCT 36.8 02/21/2017    PLT 298.0 01/30/2017   GLUCOSE 151 (H) 02/21/2017   CHOL 151 08/02/2016   TRIG 177.0 (H) 08/02/2016   HDL 39.10 08/02/2016   LDLCALC 77 08/02/2016   ALT 30 01/30/2017   AST 27 01/30/2017   NA 139 02/21/2017   K 4.5 02/21/2017   CL 106 02/21/2017   CREATININE 0.82 02/21/2017   BUN 11 02/21/2017   CO2 27 02/21/2017   TSH 5.87 (H) 01/30/2017   HGBA1C 6.0 01/30/2017   MICROALBUR 0.9 02/26/2013    No results found.  Assessment & Plan:   Crytal was seen today for cough.  Diagnoses and all orders for this visit:  Mild intermittent asthma with acute exacerbation -     ipratropium-albuterol (DUONEB) 0.5-2.5 (3) MG/3ML nebulizer solution 3 mL; Take 3 mLs by nebulization once. -     methylPREDNISolone acetate (DEPO-MEDROL) injection 40 mg; Inject 1 mL (40 mg total) into the muscle once. -     guaiFENesin (MUCINEX) 600 MG 12 hr tablet; Take 1 tablet (600 mg total) by mouth 2 (two) times daily as needed for cough or to loosen phlegm. -     doxycycline (VIBRA-TABS) 100 MG tablet; Take 1 tablet (100 mg total) by mouth 2 (two) times daily. -     HYDROcodone-homatropine (HYCODAN) 5-1.5 MG/5ML syrup; Take 5 mLs by mouth at bedtime as needed for cough. -     albuterol (VENTOLIN HFA) 108 (90 Base) MCG/ACT inhaler; 1-2 puffs q 4 hrs prn -     Fluticasone-Salmeterol (ADVAIR DISKUS) 250-50 MCG/DOSE AEPB; Inhale 1 puff into the lungs 2 (two) times daily as needed.  Acute non-recurrent pansinusitis -     methylPREDNISolone acetate (DEPO-MEDROL) injection 40 mg; Inject 1 mL (40 mg total) into the muscle once. -     guaiFENesin (MUCINEX) 600 MG 12 hr tablet; Take 1 tablet (600 mg total) by mouth 2 (two) times daily as needed for cough or to loosen phlegm. -     doxycycline (VIBRA-TABS) 100 MG tablet; Take 1 tablet (100 mg total) by mouth 2 (two) times daily. -     ipratropium (ATROVENT) 0.03 % nasal spray; Place 2 sprays into both nostrils 2 (two) times daily. Do not use for more than 5days.   I  have discontinued Ms. Strohm's HYDROcodone-acetaminophen and senna-docusate. I am also having her start on guaiFENesin, doxycycline, ipratropium, and HYDROcodone-homatropine. Additionally, I am having her maintain her latanoprost, fluticasone, timolol, PREMARIN, levothyroxine, pantoprazole, simvastatin, promethazine-dextromethorphan, Coenzyme Q10 (CO Q 10 PO), albuterol, Fluticasone-Salmeterol, aspirin, Vitamin D3, B Complex-C (SUPER B COMPLEX PO), Calcium Carbonate (CALCIUM 600 PO), Magnesium, Zinc, and Echinacea-Goldenseal (ECHINACEA COMB/GOLDEN SEAL PO). We administered ipratropium-albuterol and methylPREDNISolone acetate.  Meds ordered this encounter  Medications  . promethazine-dextromethorphan (PROMETHAZINE-DM) 6.25-15 MG/5ML syrup    Sig: TAKE 5 ML BY MOUTH EVERY 4-6 HOURS AS NEEDED    Refill:  0  . Coenzyme Q10 (CO Q 10 PO)    Sig: Take 300 mg by mouth  daily.  . ipratropium-albuterol (DUONEB) 0.5-2.5 (3) MG/3ML nebulizer solution 3 mL  . methylPREDNISolone acetate (DEPO-MEDROL) injection 40 mg  . guaiFENesin (MUCINEX) 600 MG 12 hr tablet    Sig: Take 1 tablet (600 mg total) by mouth 2 (two) times daily as needed for cough or to loosen phlegm.    Dispense:  14 tablet    Refill:  0    Order Specific Question:   Supervising Provider    Answer:   Cassandria Anger [[id number]]  . doxycycline (VIBRA-TABS) 100 MG tablet    Sig: Take 1 tablet (100 mg total) by mouth 2 (two) times daily.    Dispense:  14 tablet    Refill:  0    Order Specific Question:   Supervising Provider    Answer:   Cassandria Anger [[id number]]  . ipratropium (ATROVENT) 0.03 % nasal spray    Sig: Place 2 sprays into both nostrils 2 (two) times daily. Do not use for more than 5days.    Dispense:  30 mL    Refill:  0    Order Specific Question:   Supervising Provider    Answer:   Cassandria Anger [[id number]]  . HYDROcodone-homatropine (HYCODAN) 5-1.5 MG/5ML syrup    Sig: Take 5 mLs by mouth at bedtime as needed for  cough.    Dispense:  120 mL    Refill:  0    Order Specific Question:   Supervising Provider    Answer:   Cassandria Anger [[id number]]  . albuterol (VENTOLIN HFA) 108 (90 Base) MCG/ACT inhaler    Sig: 1-2 puffs q 4 hrs prn    Dispense:  18 g    Refill:  2    Order Specific Question:   Supervising Provider    Answer:   Cassandria Anger [[id number]]  . Fluticasone-Salmeterol (ADVAIR DISKUS) 250-50 MCG/DOSE AEPB    Sig: Inhale 1 puff into the lungs 2 (two) times daily as needed.    Dispense:  60 each    Refill:  2    Order Specific Question:   Supervising Provider    Answer:   Cassandria Anger [[id number]]  . aspirin 81 MG chewable tablet    Sig: Chew by mouth daily.  . Cholecalciferol (VITAMIN D3) 2000 units TABS    Sig: Take by mouth.  . B Complex-C (SUPER B COMPLEX PO)    Sig: Take 1,000 mcg by mouth.  . Calcium Carbonate (CALCIUM 600 PO)    Sig: Take by mouth.  . Magnesium 400 MG TABS    Sig: Take by mouth.  . Zinc 50 MG TABS    Sig: Take by mouth.  . Echinacea-Goldenseal (ECHINACEA COMB/GOLDEN SEAL PO)    Sig: Take 900 mg by mouth.   Follow-up: Return if symptoms worsen or fail to improve.  Wilfred Lacy, NP

## 2017-07-24 ENCOUNTER — Encounter: Payer: Self-pay | Admitting: Neurology

## 2017-07-24 ENCOUNTER — Ambulatory Visit (INDEPENDENT_AMBULATORY_CARE_PROVIDER_SITE_OTHER): Payer: Federal, State, Local not specified - PPO | Admitting: Neurology

## 2017-07-24 VITALS — BP 120/74 | HR 85 | Ht 63.0 in | Wt 215.0 lb

## 2017-07-24 DIAGNOSIS — M6281 Muscle weakness (generalized): Secondary | ICD-10-CM | POA: Diagnosis not present

## 2017-07-24 DIAGNOSIS — R42 Dizziness and giddiness: Secondary | ICD-10-CM

## 2017-07-24 NOTE — Patient Instructions (Addendum)
1. Refer to Vestibular therapy for evaluation and management 2. Follow-up in 6 months, call for any changes

## 2017-07-24 NOTE — Progress Notes (Signed)
NEUROLOGY FOLLOW UP OFFICE NOTE  Holly Obrien [id number]  HISTORY OF PRESENT ILLNESS: I had the pleasure of seeing Holly Obrien in follow-up in the neurology clinic on 07/24/2017.  The patient was last seen 6 months ago for episodic muscle weakness and dizziness. She had presented reporting intermittent brief episodes where she could not control her legs for a minute or so. She also started to notice that when she lifted her arms up, it felt like a rubber band was pulling it down. Her EMG/NCV of the left upper and both lower extremities did not show any evidence of a myopathy or neuromuscular junction disorder. There was note of a chronic L5 radiculopathy affecting both LE, mild in degree. MRI brain was unremarkable, no acute changes seen. Her MRI lumbar spine showed some mild degenerative changes, however most concerning was the abnormal signal throughout the posterior elements and L5 vertebral body consistent with diffuse sclerosis. 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 She reports surveillance imaging will be done in October.  Since her last visit, she states the muscle weakness has not been occurring anymore. Her limbs feel tired, sometimes when she is sitting, it feels like an effort to raise her arm up. She was also reporting dizziness where she has to hold on to things. They are happening less frequently, but she still has the episodes and last week had to hold on at the post office. She denies any clear spinning, but has a brief sensation of movement. No associated headache, vision changes, nausea/vomiting, focal numbness/tingling. No neck pain. She has occasional difficulty swallowing ("once in a blue moon"). She denies any falls.   HPI 08/24/2016: This is a pleasant 66 yo RH woman with  a history of asthma, hyperlipidemia, hypothyroidism, who presented for evaluation of episodic muscle weakness. She reports feeling tired all the time, however over the past 3-4 years, she had been having intermittent brief episodes where she could not control her legs for a minute or so. She would have to hold on to something otherwise she could not walk. At the beginning of this month, symptoms started to affect both arms, for a week she felt that every time she would raise an arm up, it felt like a rubber band was pulling it down. She also feels this in her neck when she turns it side to side, it feels like a rubber band is pulling it back. There is a little pain on both sides of her neck. She denies any associated numbness/tingling. No recent falls or heavy lifting. She reports symptoms are on both sides, and occur even when she is sitting and reaching up for things. She has some bladder incontinence (s/p bladder sling), no bowel dysfunction. She was having severe cramps in her legs with statin, this was reduced by cutting statin dose in half. She reports a history of "silent migraines" where she could not focus and start having vertical diplopia lasting a few seconds up to a minute. She has to sit and be still and wait it out. She denies any headaches for many years. No ptosis or dysphagia/dysarthria. She has chronic low back pain and feels unsteady with walking, no falls. There is no family history of similar symptoms, however she reports her mother had "weak legs and a bad back." She  denies any recent illness, travels, or immunizations.  She had bloodwork with her PCP with negative myasthenia panel, normal CK 87, CRP and ESR, ANA negative.  PAST MEDICAL HISTORY: Past Medical History:  Diagnosis Date  . Arthritis   . Asthma    with URI's  . Cataract   . Chronic kidney disease    kidney mass  . Complication of anesthesia    "One time, I had a hard time waking up"  . Esophagitis    HH/ERD  .  GERD (gastroesophageal reflux disease)   . Glaucoma   . Hyperlipidemia    NMR 2007  . Hypothyroidism   . Thyroid disease    hypothyroidism    MEDICATIONS: Current Outpatient Prescriptions on File Prior to Visit  Medication Sig Dispense Refill  . albuterol (VENTOLIN HFA) 108 (90 Base) MCG/ACT inhaler 1-2 puffs q 4 hrs prn 18 g 2  . aspirin 81 MG chewable tablet Chew by mouth daily.    . B Complex-C (SUPER B COMPLEX PO) Take 1,000 mcg by mouth.    . Calcium Carbonate (CALCIUM 600 PO) Take by mouth.    . Cholecalciferol (VITAMIN D3) 2000 units TABS Take by mouth.    . Coenzyme Q10 (CO Q 10 PO) Take 300 mg by mouth daily.    . Echinacea-Goldenseal (ECHINACEA COMB/GOLDEN SEAL PO) Take 900 mg by mouth.    . fluticasone (FLONASE) 50 MCG/ACT nasal spray 1 spray in each nostril twice a day as needed. Use the "crossover" technique as discussed 16 g 2  . Fluticasone-Salmeterol (ADVAIR DISKUS) 250-50 MCG/DOSE AEPB Inhale 1 puff into the lungs 2 (two) times daily as needed. 60 each 2  . guaiFENesin (MUCINEX) 600 MG 12 hr tablet Take 1 tablet (600 mg total) by mouth 2 (two) times daily as needed for cough or to loosen phlegm. 14 tablet 0  . HYDROcodone-homatropine (HYCODAN) 5-1.5 MG/5ML syrup Take 5 mLs by mouth at bedtime as needed for cough. 120 mL 0  . ipratropium (ATROVENT) 0.03 % nasal spray Place 2 sprays into both nostrils 2 (two) times daily. Do not use for more than 5days. 30 mL 0  . latanoprost (XALATAN) 0.005 % ophthalmic solution Place 1 drop into both eyes at bedtime.   3  . levothyroxine (SYNTHROID, LEVOTHROID) 50 MCG tablet TAKE 1 TABLET (50 MCG TOTAL) BY MOUTH DAILY BEFORE BREAKFAST. REPORTED ON 01/30/2016 90 tablet 3  . Magnesium 400 MG TABS Take by mouth.    . pantoprazole (PROTONIX) 40 MG tablet TAKE 1 TABLET (40 MG TOTAL) BY MOUTH DAILY. 30 tablet 3  . PREMARIN 0.3 MG tablet TAKE 1 TABLET BY MOUTH EVERY OTHER DAY 90 tablet 3  . promethazine-dextromethorphan (PROMETHAZINE-DM) 6.25-15  MG/5ML syrup TAKE 5 ML BY MOUTH EVERY 4-6 HOURS AS NEEDED  0  . simvastatin (ZOCOR) 20 MG tablet TAKE 1 TABLET (20 MG TOTAL) BY MOUTH AT BEDTIME. 90 tablet 0  . timolol (BETIMOL) 0.5 % ophthalmic solution Place 1 drop into both eyes daily.    . Zinc 50 MG TABS Take by mouth.     No current facility-administered medications on file prior to visit.     ALLERGIES: Allergies  Allergen Reactions  . Ciloxan [Ciprofloxacin Hcl] Swelling  . Ciprofloxacin     Rash Ciloxan per eye doctor  . Erythromycin Swelling    Face swelling  . Penicillins     Rash as child; numbness & tickling neck & head  '@22'   . Cefuroxime Axetil  uncertain  . Sulfonamide Derivatives     ? reaction    FAMILY HISTORY: Family History  Problem Relation Age of Onset  . Arthritis Other   . Depression Other   . Thyroid disease Other        hyperthyroidism  . Stroke Other   . Heart disease Other        CABG  . Diabetes Other        hypoglycemia  . Osteoarthritis Mother   . Hyperlipidemia Mother   . Hypertension Mother   . Thyroid disease Mother   . Heart failure Father   . Cancer Father        mesothelioma  . Heart failure Brother   . Hypertension Brother   . Hypertension Sister     SOCIAL HISTORY: Social History   Social History  . Marital status: Married    Spouse name: Ron  . Number of children: 2  . Years of education: N/A   Occupational History  . 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 Smoking status: Never Smoker  . Smokeless tobacco: Never Used  . Alcohol use No  . Drug use: No  . Sexual activity: No   Other Topics Concern  . Not on file   Social History Narrative  . No narrative on file    REVIEW OF SYSTEMS: Constitutional: No fevers, chills, or sweats, no generalized fatigue, change in appetite Eyes: No visual changes, double vision, eye pain Ear, nose and throat: No hearing loss, ear pain, nasal congestion, sore throat Cardiovascular: No chest pain,  palpitations Respiratory:  No shortness of breath at rest or with exertion, wheezes GastrointestinaI: No nausea, vomiting, diarrhea, abdominal pain, fecal incontinence Genitourinary:  No dysuria, urinary retention or frequency Musculoskeletal:  No neck pain, +back pain Integumentary: No rash, pruritus, skin lesions Neurological: as above Psychiatric: No depression, insomnia, anxiety Endocrine: No palpitations, fatigue, diaphoresis, mood swings, change in appetite, change in weight, increased thirst Hematologic/Lymphatic:  No anemia, purpura, petechiae. Allergic/Immunologic: no itchy/runny eyes, nasal congestion, recent allergic reactions, rashes  PHYSICAL EXAM: Vitals:   07/24/17 0934  BP: 120/74  Pulse: 85  SpO2: 95%   No data found.  General: No acute distress Head:  Normocephalic/atraumatic Skin/Extremities: No rash, no edema Neurological Exam: alert and oriented to person, place, and time. No aphasia or dysarthria. Fund of knowledge is appropriate.  Recent and remote memory are intact.  Attention and concentration are normal.    Able to name objects and repeat phrases. Cranial nerves: Pupils equal, round. No facial asymmetry. Tongue, uvula, palate midline.  Motor: 5/5 throughout with no pronator drift. Reflexes +1 throughout. Coordination: Normal finger to nose testing. Gait narrow-based and steady. Difficulty with tandem walk. +Romberg test.  IMPRESSION: This is a pleasant 66 yo RH woman with a history of asthma, hyperlipidemia, hypothyroidism, who initially presented for evaluation of episodic muscle weakness. Her EMG did not show any evidence of myopathy or neuromuscular junction disorder. It did show chronic bilateral L5 radiculopathy, mild. She had an MRI lumbar spine with mild degenerative changes, and note of abnormal signal throughout the posterior elements and L5 vertebral body classic for Paget's disease per Oncology. She reports the muscle weakness episodes have not been  occurring for a time now, etiology remains unclear. She continues to report brief episodes of a sensation of movement while standing or sitting, and will be referred for vestibular therapy for evaluation and management. She will follow-up in 6 months and  knows to call for any changes.   Thank you for allowing me to participate in her care.  Please do not hesitate to call for any questions or concerns.  The duration of this appointment visit was 25 minutes of face-to-face time with the patient.  Greater than 50% of this time was spent in counseling, explanation of diagnosis, planning of further management, and coordination of care.   Ellouise Newer, M.D.   CC: Dr. Sharlet Salina

## 2017-08-05 ENCOUNTER — Other Ambulatory Visit: Payer: Self-pay | Admitting: Internal Medicine

## 2017-08-09 ENCOUNTER — Ambulatory Visit: Payer: Federal, State, Local not specified - PPO | Admitting: Nurse Practitioner

## 2017-08-12 ENCOUNTER — Other Ambulatory Visit: Payer: Self-pay | Admitting: Family

## 2017-08-12 DIAGNOSIS — E785 Hyperlipidemia, unspecified: Secondary | ICD-10-CM

## 2017-08-16 ENCOUNTER — Ambulatory Visit: Payer: Federal, State, Local not specified - PPO | Admitting: Certified Nurse Midwife

## 2017-08-16 ENCOUNTER — Encounter: Payer: Self-pay | Admitting: Certified Nurse Midwife

## 2017-08-16 VITALS — BP 140/78 | HR 70 | Resp 16 | Ht 62.75 in | Wt 217.0 lb

## 2017-08-16 DIAGNOSIS — N898 Other specified noninflammatory disorders of vagina: Secondary | ICD-10-CM

## 2017-08-16 DIAGNOSIS — N951 Menopausal and female climacteric states: Secondary | ICD-10-CM

## 2017-08-16 DIAGNOSIS — Z01419 Encounter for gynecological examination (general) (routine) without abnormal findings: Secondary | ICD-10-CM

## 2017-08-16 NOTE — Patient Instructions (Signed)

## 2017-08-16 NOTE — Progress Notes (Signed)
66 y.o. [id number] MarriedCaucasianF here for annual exam. Menopausal no HRT. Denies vaginal bleeding. Some vaginal dryness but not using anything. Sees PCP, Dr. Sharlet Salina for aex labs and labs and Hypothyroid ,Cholesterol and GERD management. Has appointment soon. Busy with caring for relatives. Occasional leaking with vigorous cough, but has not increased. History of bladder repair. Now being followed for kidney lesion, recent biopsy by urology. No other health issues today.  Patient's last menstrual period was 05/01/1987.          Sexually active: No.  The current method of family planning is hysterectomy.    Exercising: Yes.    walking Smoker:  no  Health Maintenance: Pap:  04/23/01 negative History of abnormal Pap:  no MMG:  10/05/16 BIRADS1:neg  Colonoscopy:  2008 or 2009 normal diverticulitis BMD:   08/04/14  TDaP:  2018 Pneumonia vaccine(s):  2016 Zostavax:   2017 Hep C testing: not done Screening Labs: no   reports that she has never smoked. She has never used smokeless tobacco. She reports that she does not drink alcohol or use drugs.  Past Medical History:  Diagnosis Date  . Arthritis   . Asthma    with URI's  . Cataract   . Chronic kidney disease    kidney mass  . Complication of anesthesia    "One time, I had a hard time waking up"  . Esophagitis    HH/ERD  . GERD (gastroesophageal reflux disease)   . Glaucoma   . Hyperlipidemia    NMR 2007  . Hypothyroidism   . Thyroid disease    hypothyroidism    Past Surgical History:  Procedure Laterality Date  . ABDOMINAL HYSTERECTOMY  04/30/88   Hysterectomy and bladder tact-- Dr Molli Hazard  . CARPAL TUNNEL RELEASE  06/18/00   right wrist -- Dr Tamala Fothergill  . FINGER SURGERY  12/10/06   left index finger injury--Dr Theodis Sato  . OOPHORECTOMY  09/11/94, 01/14/96   1995-left ovary removed. 1997 Right ovary removed--Dr Molli Hazard  . PUBOVAGINAL SLING  1997   Dr Karsten Ro  . ROBOTIC ASSITED PARTIAL NEPHRECTOMY Right 02/20/2017    Procedure: XI ROBOTIC ASSITED PARTIAL NEPHRECTOMY;  Surgeon: Alexis Frock, MD;  Location: [facility];  Service: Urology;  Laterality: Right;  . TRIGGER FINGER RELEASE Right 01/2014  . TRIGGER FINGER RELEASE Left 09/2014    Current Outpatient Prescriptions  Medication Sig Dispense Refill  . albuterol (VENTOLIN HFA) 108 (90 Base) MCG/ACT inhaler 1-2 puffs q 4 hrs prn 18 g 2  . aspirin 81 MG chewable tablet Chew by mouth daily.    . B Complex-C (SUPER B COMPLEX PO) Take 1,000 mcg by mouth.    . Calcium Carbonate (CALCIUM 600 PO) Take by mouth.    . Cholecalciferol (VITAMIN D3) 2000 units TABS Take by mouth.    . Coenzyme Q10 (CO Q 10 PO) Take 300 mg by mouth daily.    . Echinacea-Goldenseal (ECHINACEA COMB/GOLDEN SEAL PO) Take 900 mg by mouth.    . fluticasone (FLONASE) 50 MCG/ACT nasal spray 1 spray in each nostril twice a day as needed. Use the "crossover" technique as discussed 16 g 2  . Fluticasone-Salmeterol (ADVAIR DISKUS) 250-50 MCG/DOSE AEPB Inhale 1 puff into the lungs 2 (two) times daily as needed. 60 each 2  . latanoprost (XALATAN) 0.005 % ophthalmic solution Place 1 drop into both eyes at bedtime.   3  . levothyroxine (SYNTHROID, LEVOTHROID) 50 MCG tablet TAKE 1 TABLET (50 MCG TOTAL) BY MOUTH DAILY  BEFORE BREAKFAST. REPORTED ON 01/30/2016 90 tablet 3  . Magnesium 400 MG TABS Take by mouth.    . pantoprazole (PROTONIX) 40 MG tablet TAKE 1 TABLET (40 MG TOTAL) BY MOUTH DAILY. 30 tablet 3  . PREMARIN 0.3 MG tablet TAKE 1 TABLET BY MOUTH EVERY OTHER DAY 90 tablet 3  . simvastatin (ZOCOR) 20 MG tablet TAKE 1 TABLET BY MOUTH EVERYDAY AT BEDTIME 90 tablet 1  . timolol (BETIMOL) 0.5 % ophthalmic solution Place 1 drop into both eyes daily.    . Zinc 50 MG TABS Take by mouth.     No current facility-administered medications for this visit.     Family History  Problem Relation Age of Onset  . Arthritis Other   . Depression Other   . Thyroid disease Other        hyperthyroidism  . Stroke  Other   . Heart disease Other        CABG  . Diabetes Other        hypoglycemia  . Osteoarthritis Mother   . Hyperlipidemia Mother   . Hypertension Mother   . Thyroid disease Mother   . Heart failure Father   . Cancer Father        mesothelioma  . Heart failure Brother   . Hypertension Brother   . Hypertension Sister     ROS:  Pertinent items are noted in HPI.  Otherwise, a comprehensive ROS was negative.  Exam:   BP 140/78   Pulse 70   Resp 16   Ht 5' 2.75" (1.594 m)   Wt 217 lb (98.4 kg)   LMP 05/01/1987   BMI 38.75 kg/m    Weight change: @WEIGHTCHANGE @ Height:   Height: 5' 2.75" (159.4 cm)  Ht Readings from Last 3 Encounters:  08/16/17 5' 2.75" (1.594 m)  07/24/17 5\' 3"  (1.6 m)  06/11/17 5\' 3"  (1.6 m)    General appearance: alert, cooperative and appears stated age Head: Normocephalic, without obvious abnormality, atraumatic Neck: no adenopathy, supple, symmetrical, trachea midline and thyroid normal to inspection and palpation Lungs: clear to auscultation bilaterally Breasts: normal appearance, no masses or tenderness, No nipple retraction or dimpling, No nipple discharge or bleeding, No axillary or supraclavicular adenopathy Heart: regular rate and rhythm Abdomen: soft, non-tender; bowel sounds normal; no masses,  no organomegaly Extremities: extremities normal, atraumatic, no cyanosis or edema Skin: Skin color, texture, turgor normal. No rashes or lesions Lymph nodes: Cervical, supraclavicular, and axillary nodes normal. No abnormal inguinal nodes palpated Neurologic: Grossly normal   Pelvic: External genitalia:  no lesions              Urethra:  normal appearing urethra with no masses, tenderness or lesions              Bartholins and Skenes: normal                 Vagina: normal appearing vagina with normal color and discharge, no lesions              Cervix: absent              Pap taken: No. Bimanual Exam:  Uterus:  uterus absent              Adnexa:  no mass, fullness, or tenderness                Rectovaginal: Confirms               Anus:  normal sphincter tone, no lesions  Chaperone was present for exam.  A:  Well Woman with normal exam  Menopausal on ERT trying to wean off with Estroven use  Vaginal dryness  Hypothyroid, cholesterol, GERD, kidney lesion with MD management.  P:   Discussed normal exam findings.  Discussed risk of ERT with cardiovascular changes. Suggested to patient to progress with weaning off Premarin. Patient agreeable, has been using Estroven with good success. No refill given. Will advise if issues Discussed vaginal dryness and OTC counter options vs vaginal estrogen. Patient will do trial of OTC and advise if issues.  Continue follow up with MD as indicated.   counseled on breast self exam, mammography screening, feminine hygiene, osteoporosis, adequate intake of calcium and vitamin D, diet and exercise, Kegel's exercises  return annually or prn

## 2017-08-16 NOTE — Progress Notes (Deleted)
66 y.o. [id number] Married  {Race/ethnicity:17218} Fe here for annual exam.    Patient's last menstrual period was 05/01/1987.          Sexually active: {yes no:314532}  The current method of family planning is {contraception:315051}.    Exercising: {yes no:314532}  {types:19826} Smoker:  {YES NO:22349}  Health Maintenance: Pap:  *** History of Abnormal Pap: {YES NO:22349} MMG:  *** Self Breast exams: {YES NO:22349} Colonoscopy:  *** BMD:   *** TDaP:  *** Shingles: *** Pneumonia: *** Hep C and HIV: *** Labs: ***   reports that she has never smoked. She has never used smokeless tobacco. She reports that she does not drink alcohol or use drugs.  Past Medical History:  Diagnosis Date  . Arthritis   . Asthma    with URI's  . Cataract   . Chronic kidney disease    kidney mass  . Complication of anesthesia    "One time, I had a hard time waking up"  . Esophagitis    HH/ERD  . GERD (gastroesophageal reflux disease)   . Glaucoma   . Hyperlipidemia    NMR 2007  . Hypothyroidism   . Thyroid disease    hypothyroidism    Past Surgical History:  Procedure Laterality Date  . ABDOMINAL HYSTERECTOMY  04/30/88   Hysterectomy and bladder tact-- Dr Molli Hazard  . CARPAL TUNNEL RELEASE  06/18/00   right wrist -- Dr Tamala Fothergill  . FINGER SURGERY  12/10/06   left index finger injury--Dr Theodis Sato  . OOPHORECTOMY  09/11/94, 01/14/96   1995-left ovary removed. 1997 Right ovary removed--Dr Molli Hazard  . PUBOVAGINAL SLING  1997   Dr Karsten Ro  . ROBOTIC ASSITED PARTIAL NEPHRECTOMY Right 02/20/2017   Procedure: XI ROBOTIC ASSITED PARTIAL NEPHRECTOMY;  Surgeon: Alexis Frock, MD;  Location: [facility];  Service: Urology;  Laterality: Right;  . TRIGGER FINGER RELEASE Right 01/2014  . TRIGGER FINGER RELEASE Left 09/2014    Current Outpatient Prescriptions  Medication Sig Dispense Refill  . albuterol (VENTOLIN HFA) 108 (90 Base) MCG/ACT inhaler 1-2 puffs q 4 hrs prn 18 g 2  . aspirin 81 MG  chewable tablet Chew by mouth daily.    . B Complex-C (SUPER B COMPLEX PO) Take 1,000 mcg by mouth.    . Calcium Carbonate (CALCIUM 600 PO) Take by mouth.    . Cholecalciferol (VITAMIN D3) 2000 units TABS Take by mouth.    . Coenzyme Q10 (CO Q 10 PO) Take 300 mg by mouth daily.    . Echinacea-Goldenseal (ECHINACEA COMB/GOLDEN SEAL PO) Take 900 mg by mouth.    . fluticasone (FLONASE) 50 MCG/ACT nasal spray 1 spray in each nostril twice a day as needed. Use the "crossover" technique as discussed 16 g 2  . Fluticasone-Salmeterol (ADVAIR DISKUS) 250-50 MCG/DOSE AEPB Inhale 1 puff into the lungs 2 (two) times daily as needed. 60 each 2  . guaiFENesin (MUCINEX) 600 MG 12 hr tablet Take 1 tablet (600 mg total) by mouth 2 (two) times daily as needed for cough or to loosen phlegm. 14 tablet 0  . HYDROcodone-homatropine (HYCODAN) 5-1.5 MG/5ML syrup Take 5 mLs by mouth at bedtime as needed for cough. 120 mL 0  . ipratropium (ATROVENT) 0.03 % nasal spray Place 2 sprays into both nostrils 2 (two) times daily. Do not use for more than 5days. 30 mL 0  . latanoprost (XALATAN) 0.005 % ophthalmic solution Place 1 drop into both eyes at bedtime.   3  .  levothyroxine (SYNTHROID, LEVOTHROID) 50 MCG tablet TAKE 1 TABLET (50 MCG TOTAL) BY MOUTH DAILY BEFORE BREAKFAST. REPORTED ON 01/30/2016 90 tablet 3  . Magnesium 400 MG TABS Take by mouth.    . pantoprazole (PROTONIX) 40 MG tablet TAKE 1 TABLET (40 MG TOTAL) BY MOUTH DAILY. 30 tablet 3  . PREMARIN 0.3 MG tablet TAKE 1 TABLET BY MOUTH EVERY OTHER DAY 90 tablet 3  . promethazine-dextromethorphan (PROMETHAZINE-DM) 6.25-15 MG/5ML syrup TAKE 5 ML BY MOUTH EVERY 4-6 HOURS AS NEEDED  0  . simvastatin (ZOCOR) 20 MG tablet TAKE 1 TABLET BY MOUTH EVERYDAY AT BEDTIME 90 tablet 1  . timolol (BETIMOL) 0.5 % ophthalmic solution Place 1 drop into both eyes daily.    . Zinc 50 MG TABS Take by mouth.     No current facility-administered medications for this visit.     Family  History  Problem Relation Age of Onset  . Arthritis Other   . Depression Other   . Thyroid disease Other        hyperthyroidism  . Stroke Other   . Heart disease Other        CABG  . Diabetes Other        hypoglycemia  . Osteoarthritis Mother   . Hyperlipidemia Mother   . Hypertension Mother   . Thyroid disease Mother   . Heart failure Father   . Cancer Father        mesothelioma  . Heart failure Brother   . Hypertension Brother   . Hypertension Sister     ROS:  Pertinent items are noted in HPI.  Otherwise, a comprehensive ROS was negative.  Exam:   LMP 05/01/1987    Ht Readings from Last 3 Encounters:  07/24/17 5\' 3"  (1.6 m)  06/11/17 5\' 3"  (1.6 m)  02/20/17 5\' 3"  (1.6 m)    General appearance: alert, cooperative and appears stated age Head: Normocephalic, without obvious abnormality, atraumatic Neck: no adenopathy, supple, symmetrical, trachea midline and thyroid {EXAM; THYROID:18604} Lungs: clear to auscultation bilaterally Breasts: {Exam; breast:13139::"normal appearance, no masses or tenderness"} Heart: regular rate and rhythm Abdomen: soft, non-tender; no masses,  no organomegaly Extremities: extremities normal, atraumatic, no cyanosis or edema Skin: Skin color, texture, turgor normal. No rashes or lesions Lymph nodes: Cervical, supraclavicular, and axillary nodes normal. No abnormal inguinal nodes palpated Neurologic: Grossly normal   Pelvic: External genitalia:  no lesions              Urethra:  normal appearing urethra with no masses, tenderness or lesions              Bartholin's and Skene's: normal                 Vagina: normal appearing vagina with normal color and discharge, no lesions              Cervix: {exam; cervix:14595}              Pap taken: {yes no:314532} Bimanual Exam:  Uterus:  {exam; uterus:12215}              Adnexa: {exam; adnexa:12223}               Rectovaginal: Confirms               Anus:  normal sphincter tone, no  lesions  Chaperone present: ***  A:  Well Woman with normal exam  P:   Reviewed health and wellness pertinent to exam  Pap smear: {  YES NO:22349}  {plan; gyn:5269::"mammogram","pap smear","return annually or prn"}  An After Visit Summary was printed and given to the patient.

## 2017-08-16 NOTE — Progress Notes (Deleted)
66 y.o. [id number] MarriedCaucasianF here for annual exam. Menopausal no HRT. Denies vaginal bleeding. Some vaginal dryness but not using anything. Sees PCP for aex labs and labs and Hypothyroid ,Cholesterol and GERD.   Patient's last menstrual period was 05/01/1987.          Sexually active: No.  The current method of family planning is hysterectomy.    Exercising: Yes.    walking Smoker:  no  Health Maintenance: Pap:  04/23/01 negative History of abnormal Pap:  no MMG:  10/05/16 BIRADS1:neg  Colonoscopy:  2008 or 2009 normal diverticulitis BMD:   08/04/14  TDaP:  2018 Pneumonia vaccine(s):  2016 Zostavax:   2017 Hep C testing: not done Screening Labs: ***   reports that she has never smoked. She has never used smokeless tobacco. She reports that she does not drink alcohol or use drugs.  Past Medical History:  Diagnosis Date  . Arthritis   . Asthma    with URI's  . Cataract   . Chronic kidney disease    kidney mass  . Complication of anesthesia    "One time, I had a hard time waking up"  . Esophagitis    HH/ERD  . GERD (gastroesophageal reflux disease)   . Glaucoma   . Hyperlipidemia    NMR 2007  . Hypothyroidism   . Thyroid disease    hypothyroidism    Past Surgical History:  Procedure Laterality Date  . ABDOMINAL HYSTERECTOMY  04/30/88   Hysterectomy and bladder tact-- Dr Molli Hazard  . CARPAL TUNNEL RELEASE  06/18/00   right wrist -- Dr Tamala Fothergill  . FINGER SURGERY  12/10/06   left index finger injury--Dr Theodis Sato  . OOPHORECTOMY  09/11/94, 01/14/96   1995-left ovary removed. 1997 Right ovary removed--Dr Molli Hazard  . PUBOVAGINAL SLING  1997   Dr Karsten Ro  . ROBOTIC ASSITED PARTIAL NEPHRECTOMY Right 02/20/2017   Procedure: XI ROBOTIC ASSITED PARTIAL NEPHRECTOMY;  Surgeon: Alexis Frock, MD;  Location: [facility];  Service: Urology;  Laterality: Right;  . TRIGGER FINGER RELEASE Right 01/2014  . TRIGGER FINGER RELEASE Left 09/2014    Current Outpatient Prescriptions   Medication Sig Dispense Refill  . albuterol (VENTOLIN HFA) 108 (90 Base) MCG/ACT inhaler 1-2 puffs q 4 hrs prn 18 g 2  . aspirin 81 MG chewable tablet Chew by mouth daily.    . B Complex-C (SUPER B COMPLEX PO) Take 1,000 mcg by mouth.    . Calcium Carbonate (CALCIUM 600 PO) Take by mouth.    . Cholecalciferol (VITAMIN D3) 2000 units TABS Take by mouth.    . Coenzyme Q10 (CO Q 10 PO) Take 300 mg by mouth daily.    . Echinacea-Goldenseal (ECHINACEA COMB/GOLDEN SEAL PO) Take 900 mg by mouth.    . fluticasone (FLONASE) 50 MCG/ACT nasal spray 1 spray in each nostril twice a day as needed. Use the "crossover" technique as discussed 16 g 2  . Fluticasone-Salmeterol (ADVAIR DISKUS) 250-50 MCG/DOSE AEPB Inhale 1 puff into the lungs 2 (two) times daily as needed. 60 each 2  . latanoprost (XALATAN) 0.005 % ophthalmic solution Place 1 drop into both eyes at bedtime.   3  . levothyroxine (SYNTHROID, LEVOTHROID) 50 MCG tablet TAKE 1 TABLET (50 MCG TOTAL) BY MOUTH DAILY BEFORE BREAKFAST. REPORTED ON 01/30/2016 90 tablet 3  . Magnesium 400 MG TABS Take by mouth.    . pantoprazole (PROTONIX) 40 MG tablet TAKE 1 TABLET (40 MG TOTAL) BY MOUTH DAILY. 30 tablet 3  .  PREMARIN 0.3 MG tablet TAKE 1 TABLET BY MOUTH EVERY OTHER DAY 90 tablet 3  . simvastatin (ZOCOR) 20 MG tablet TAKE 1 TABLET BY MOUTH EVERYDAY AT BEDTIME 90 tablet 1  . timolol (BETIMOL) 0.5 % ophthalmic solution Place 1 drop into both eyes daily.    . Zinc 50 MG TABS Take by mouth.     No current facility-administered medications for this visit.     Family History  Problem Relation Age of Onset  . Arthritis Other   . Depression Other   . Thyroid disease Other        hyperthyroidism  . Stroke Other   . Heart disease Other        CABG  . Diabetes Other        hypoglycemia  . Osteoarthritis Mother   . Hyperlipidemia Mother   . Hypertension Mother   . Thyroid disease Mother   . Heart failure Father   . Cancer Father        mesothelioma  .  Heart failure Brother   . Hypertension Brother   . Hypertension Sister     ROS:  Pertinent items are noted in HPI.  Otherwise, a comprehensive ROS was negative.  Exam:   BP 140/78   Pulse 70   Resp 16   Ht 5' 2.75" (1.594 m)   Wt 217 lb (98.4 kg)   LMP 05/01/1987   BMI 38.75 kg/m   Weight change: @WEIGHTCHANGE @ Height:   Height: 5' 2.75" (159.4 cm)  Ht Readings from Last 3 Encounters:  08/16/17 5' 2.75" (1.594 m)  07/24/17 5\' 3"  (1.6 m)  06/11/17 5\' 3"  (1.6 m)    General appearance: alert, cooperative and appears stated age Head: Normocephalic, without obvious abnormality, atraumatic Neck: no adenopathy, supple, symmetrical, trachea midline and thyroid {EXAM; THYROID:18604} Lungs: clear to auscultation bilaterally Breasts: {Exam; breast:13139::"normal appearance, no masses or tenderness"} Heart: regular rate and rhythm Abdomen: soft, non-tender; bowel sounds normal; no masses,  no organomegaly Extremities: extremities normal, atraumatic, no cyanosis or edema Skin: Skin color, texture, turgor normal. No rashes or lesions Lymph nodes: Cervical, supraclavicular, and axillary nodes normal. No abnormal inguinal nodes palpated Neurologic: Grossly normal   Pelvic: External genitalia:  no lesions              Urethra:  normal appearing urethra with no masses, tenderness or lesions              Bartholins and Skenes: normal                 Vagina: normal appearing vagina with normal color and discharge, no lesions              Cervix: {exam; cervix:14595}              Pap taken: {yes no:314532} Bimanual Exam:  Uterus:  {exam; uterus:12215}              Adnexa: {exam; adnexa:12223}               Rectovaginal: Confirms               Anus:  normal sphincter tone, no lesions  Chaperone was present for exam.  A:  Well Woman with normal exam  P:   {plan; gyn:5269::"mammogram","pap smear","return annually or prn"}

## 2017-08-30 ENCOUNTER — Ambulatory Visit
Payer: Federal, State, Local not specified - PPO | Attending: Neurology | Admitting: Rehabilitative and Restorative Service Providers"

## 2017-08-30 DIAGNOSIS — R42 Dizziness and giddiness: Secondary | ICD-10-CM | POA: Insufficient documentation

## 2017-08-30 DIAGNOSIS — R2689 Other abnormalities of gait and mobility: Secondary | ICD-10-CM | POA: Insufficient documentation

## 2017-08-30 NOTE — Patient Instructions (Signed)
Gaze Stabilization - Tip Card  1.Target must remain in focus, not blurry, and appear stationary while head is in motion. 2.Perform exercises with small head movements (45 to either side of midline). 3.Increase speed of head motion so long as target is in focus. 4.If you wear eyeglasses, be sure you can see target through lens (therapist will give specific instructions for bifocal / progressive lenses). 5.These exercises may provoke dizziness or nausea. Work through these symptoms. If too dizzy, slow head movement slightly. Rest between each exercise. 6.Exercises demand concentration; avoid distractions. 7.For safety, perform standing exercises close to a counter, wall, corner, or next to someone.  Copyright  VHI. All rights reserved.   Gaze Stabilization - Standing Feet Apart   Feet shoulder width apart, keeping eyes on target on wall 3 feet away, tilt head down slightly and move head side to side for 30 seconds. Repeat while moving head up and down for 30 seconds. *Work up to tolerating 60 seconds, as able. Do 2-3 sessions per day.   Copyright  VHI. All rights reserved.    

## 2017-08-30 NOTE — Therapy (Signed)
[facility] [address] [place], [place], [address] Phone: [phone]   Fax:  [phone]  Physical Therapy Evaluation  Patient Details  Name: Holly Obrien MRN: [id number] Date of Birth: 11-Oct-1951 Referring Provider: Ellouise Newer, MD  Encounter Date: 08/30/2017      PT End of Session - 08/30/17 1029    Visit Number 1   Number of Visits 6   Date for PT Re-Evaluation 10/14/17   Authorization Type Private insurance   PT Start Time [phone]   PT Stop Time 1025   PT Time Calculation (min) 45 min   Activity Tolerance Patient tolerated treatment well   Behavior During Therapy [facility] for tasks assessed/performed      Past Medical History:  Diagnosis Date  . Arthritis   . Asthma    with URI's  . Cataract   . Chronic kidney disease    kidney mass  . Complication of anesthesia    "One time, I had a hard time waking up"  . Esophagitis    HH/ERD  . GERD (gastroesophageal reflux disease)   . Glaucoma   . Hyperlipidemia    NMR 2007  . Hypothyroidism   . Thyroid disease    hypothyroidism    Past Surgical History:  Procedure Laterality Date  . ABDOMINAL HYSTERECTOMY  04/30/88   Hysterectomy and bladder tact-- Dr Molli Hazard  . CARPAL TUNNEL RELEASE  06/18/00   right wrist -- Dr Tamala Fothergill  . FINGER SURGERY  12/10/06   left index finger injury--Dr Theodis Sato  . OOPHORECTOMY  09/11/94, 01/14/96   1995-left ovary removed. 1997 Right ovary removed--Dr Molli Hazard  . PUBOVAGINAL SLING  1997   Dr Karsten Ro  . ROBOTIC ASSITED PARTIAL NEPHRECTOMY Right 02/20/2017   Procedure: XI ROBOTIC ASSITED PARTIAL NEPHRECTOMY;  Surgeon: Alexis Frock, MD;  Location: [facility];  Service: Urology;  Laterality: Right;  . TRIGGER FINGER RELEASE Right 01/2014  . TRIGGER FINGER RELEASE Left 09/2014    There were no vitals filed for this visit.       Subjective Assessment - 08/30/17 0942    Subjective The patient reports h/o "spells" of  vertigo that worsened in 2017.  She also notes a h/o vision changes with double vision and blurred vision.  She also notes weakness in her arms and legs.  The patient notes R ear is "stopped up" right now and she gets intermittent tinnitus blaterally.  Dizzy spells can occur when sitting still and when on her feet.  When this occurs, it lasts for seconds and she has to hold onto an object for support.    She gets a sensation of being "pulled down", she denies sensation of room spinning.   She sees eye doctor every 6 months.    Pertinent History Weakness in arms and legs per report,  evidence of prior stroke on MRI (left cerebellar infarct)   Patient Stated Goals "find out what's going on"   Currently in Pain? No/denies            [facility] PT Assessment - 08/30/17 0949      Assessment   Medical Diagnosis dizziness and muscle weakness   Referring Provider Ellouise Newer, MD   Onset Date/Surgical Date --  declining function   Hand Dominance Right   Prior Therapy none     Precautions   Precautions Fall     Restrictions   Weight Bearing Restrictions No     Balance Screen   Has  the patient fallen in the past 6 months No   Has the patient had a decrease in activity level because of a fear of falling?  No  Notes tired in afternoon; less active at that time   Is the patient reluctant to leave their home because of a fear of falling?  No     Home Environment   Living Environment Private residence   Living Arrangements Spouse/significant other   Type of [place] Access Level entry   [place] None     Prior Function   Level of [place] Retired     Associate Professor   Overall Cognitive Status Within Functional Limits for tasks assessed     Observation/Other Assessments   Focus on Therapeutic Outcomes (FOTO)  50%   Other Surveys  Other Surveys   Dizziness Handicap Inventory ([place])  10%     ROM / Strength   AROM / PROM /  Strength AROM;Strength     AROM   Overall AROM  Within functional limits for tasks performed     Strength   Overall Strength Within functional limits for tasks performed   Overall Strength Comments Notes a sensation of fatigue with movement.  5/5 bilateral hip flexion, knee extension/flexion, ankle DF     Ambulation/Gait   Ambulation/Gait Yes   Gait Pattern Within Functional Limits   Ambulation Surface Level;Indoor   Gait velocity 3.28 ft/sec     Standardized Balance Assessment   Standardized Balance Assessment Berg Balance Test     Berg Balance Test   Sit to Stand Able to stand without using hands and stabilize independently   Standing Unsupported Able to stand safely 2 minutes   Sitting with Back Unsupported but Feet Supported on Floor or Stool Able to sit safely and securely 2 minutes   Stand to Sit Sits safely with minimal use of hands   Transfers Able to transfer safely, minor use of hands   Standing Unsupported with Eyes Closed Able to stand 10 seconds safely   Standing Ubsupported with Feet Together Able to place feet together independently and stand 1 minute safely   From Standing, Reach Forward with Outstretched Arm Can reach confidently >25 cm (10")   From Standing Position, Pick up Object from Floor Able to pick up shoe safely and easily   From Standing Position, Turn to Look Behind Over each Shoulder Looks behind from both sides and weight shifts well   Turn 360 Degrees Able to turn 360 degrees safely but slowly   Standing Unsupported, Alternately Place Feet on Step/Stool Able to stand independently and safely and complete 8 steps in 20 seconds   Standing Unsupported, One Foot in Front Able to plae foot ahead of the other independently and hold 30 seconds   Standing on One Leg Able to lift leg independently and hold > 10 seconds   Total Score 53   Berg comment: 53/56            Vestibular Assessment - 08/30/17 0951      Vestibular Assessment   General  Observation Walks into clinic independently.  She notes one week 09/2016 where symptoms were constant.  She had prior episodes described more as double vision/blurriness.   Wears progressive trifocals.  h/o Double vision when occurs is vertical.     Symptom Behavior   Type of Dizziness --  Can be blurred vision, diplopia, imbalance.   Frequency of Dizziness  intermittent spells of dizziness 3-4 times/week; double vision occurs irregularly/spontaneous spells   Duration of Dizziness seconds for dizziness; seconds to minutes for double vision   Aggravating Factors No known aggravating factors   Relieving Factors No known relieving factors     Occulomotor Exam   Occulomotor Alignment Abnormal  L eye hypertropia   Spontaneous Absent   Gaze-induced Absent   Smooth Pursuits Intact   Saccades --  takes 2 eye movements to get to target bilaterally   Comment *? ocular roll + on right*; test of skew WNLs- no refixation     Vestibulo-Occular Reflex   VOR 1 Head Only (x 1 viewing) slow gaze provokes a blurry sensation   VOR Cancellation Normal   Comment Head impulse testing is + bilaterally for refixation saccade with patient having visual blurring.     Visual Acuity   Static line 7   Dynamic line 2  5 line difference indicative of dec'd VOR and hypofunction     Positional Testing   Dix-Hallpike Dix-Hallpike Right;Dix-Hallpike Left   Horizontal Canal Testing Horizontal Canal Right;Horizontal Canal Left     Dix-Hallpike Right   Dix-Hallpike Right Duration 0   Dix-Hallpike Right Symptoms No nystagmus     Dix-Hallpike Left   Dix-Hallpike Left Duration 0   Dix-Hallpike Left Symptoms No nystagmus     Horizontal Canal Right   Horizontal Canal Right Duration 0   Horizontal Canal Right Symptoms Normal     Horizontal Canal Left   Horizontal Canal Left Duration 0   Horizontal Canal Left Symptoms Normal     Positional Sensitivities   Head Turning x 5 No dizziness   Head Nodding x 5  Lightheadedness   Pivot Right in Standing --  Imbalance with task   Pivot Left in Standing --  imbalance with task        Objective measurements completed on examination: See above findings.           Vestibular Treatment/Exercise - 08/30/17 1028      Vestibular Treatment/Exercise   Vestibular Treatment Provided Gaze   Gaze Exercises X1 Viewing Horizontal     X1 Viewing Horizontal   Foot Position standing feet apart   Comments x 20 seconds with cues to increase speed as able               PT Education - 08/30/17 1029    Education provided Yes   Education Details HEP for gaze x 1 viewing; nature of hypofunction for which we will be providing home exercises with further balance assessment on SOT to follow   Person(s) Educated Patient   Methods Explanation;Demonstration   Comprehension Verbalized understanding;Returned demonstration             PT Long Term Goals - 08/30/17 1030      PT LONG TERM GOAL #1   Title The patient will be indep with HEP for gaze adaptation and high level/multi-sensory balance training.   Time 6   Period Weeks   Target Date 10/14/17     PT LONG TERM GOAL #2   Title The patient will improve functional status score on FOTO survey from 50% up to 60%.   Time 6   Period Weeks   Target Date 10/14/17     PT LONG TERM GOAL #3   Title The patient will tolerate gaze x 1 viewing x 60 seconds nonstop without c/o visual blurring.   Time 6   Period Weeks   Target Date 10/14/17  PT LONG TERM GOAL #4   Title The patient will improve SVA vs DVA from 5 lines to 3 lines to demo improving gaze adaptation.   Time 6   Period Weeks   Target Date 10/14/17     PT LONG TERM GOAL #5   Title Further assess sensory organization test and write goals as indicated.   Time 6   Period Weeks   Target Date 10/14/17                Plan - 08/30/17 1034    Clinical Impression Statement The patient is a 66 yo female presenting with  intermittent episodes of dizziness further described as imbalance following a week long constant sensation of dizziness in 09/2016.  She also notes a h/o intermittent visual changes including blurring and diplopia for which she has been medically evaluated.  At today's evaluation OCULOMOTOR exam revealed a mild L hypertropia (which  may contribute to vertical diplopia in subjective--?could this be correlated to L cerebellar infarct found on MRI), saccades requires 2 beats to target.  VOR:  patient with diminished use of vestibular ocular reflex indicating a bilateral vestibular hypofunction per + head impulse testing and abnormal SVA vs DVA.  POSITIONAL testing was WNLs.  BALANCE: patient notes subjective unsteadiness during turns and when walking in low light environments.  PT to further assess multi-sensory balance with posturography.  PT discussed with patient that spells that occur at rest, or that are only diplopia may not be impacted by our exercise program.    History and Personal Factors relevant to plan of care: Patient with h/o hypothyroidism, reports prior dx by eye doctor of possible visual migraines   Clinical Presentation Stable   Clinical Presentation due to:     Clinical Decision Making Low   Rehab Potential Good   PT Frequency 1x / week   PT Duration 6 weeks   PT Treatment/Interventions ADLs/Self Care Home Management;Therapeutic activities;Therapeutic exercise;Balance training;Neuromuscular re-education;Gait training;Functional mobility training;Patient/family education;Vestibular   PT Next Visit Plan Sensory organization testing + develop HEP for multi-sensory balance; gaze adaptation progression, dynamic gait- assess FGA   Consulted and Agree with Plan of Care Patient      Patient will benefit from skilled therapeutic intervention in order to improve the following deficits and impairments:  Decreased balance, Dizziness  Visit Diagnosis: Dizziness and giddiness  Other  abnormalities of gait and mobility     Problem List Patient Active Problem List   Diagnosis Date Noted  . Left renal mass 02/21/2017  . Renal mass 02/20/2017  . Muscle weakness (generalized) 01/24/2017  . Dizziness 01/24/2017  . Kidney lesion, native, right 11/29/2016  . 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 History of cerebellar stroke 11/29/2016  . Coronary arteriosclerosis in native artery 11/29/2016  . Fatty liver disease, nonalcoholic 57/84/6962  . Lumbosacral radiculopathy 10/24/2016  . Routine general medical examination at a health care facility 01/31/2016  . Family history of ischemic heart disease 07/19/2011  . Vitamin D deficiency 05/30/2010  . Myalgia and myositis 05/30/2010  . TMJ PAIN 01/02/2010  . Asthma 09/20/2009  . Hypothyroidism 06/07/2009  . Hyperlipidemia 06/07/2009  . GERD 06/07/2009    Zalika Tieszen, PT 08/30/2017, 10:40 AM  [facility] [address] [place], [place], [address] Phone: [phone]   Fax:  [phone]  Name: MAILANI DEGROOTE MRN: [id number] Date of Birth: 04/09/1951

## 2017-09-07 ENCOUNTER — Ambulatory Visit (INDEPENDENT_AMBULATORY_CARE_PROVIDER_SITE_OTHER): Payer: Federal, State, Local not specified - PPO

## 2017-09-07 DIAGNOSIS — Z23 Encounter for immunization: Secondary | ICD-10-CM

## 2017-09-09 ENCOUNTER — Other Ambulatory Visit: Payer: Self-pay | Admitting: Urology

## 2017-09-09 ENCOUNTER — Ambulatory Visit (HOSPITAL_COMMUNITY)
Admission: RE | Admit: 2017-09-09 | Discharge: 2017-09-09 | Disposition: A | Discharge status: Home / Self Care | Payer: Federal, State, Local not specified - PPO | Source: Ambulatory Visit | Attending: Urology | Admitting: Urology

## 2017-09-09 DIAGNOSIS — C641 Malignant neoplasm of right kidney, except renal pelvis: Secondary | ICD-10-CM

## 2017-09-18 ENCOUNTER — Ambulatory Visit: Payer: Federal, State, Local not specified - PPO | Admitting: Rehabilitative and Restorative Service Providers"

## 2017-09-18 DIAGNOSIS — R2689 Other abnormalities of gait and mobility: Secondary | ICD-10-CM

## 2017-09-18 DIAGNOSIS — R42 Dizziness and giddiness: Secondary | ICD-10-CM

## 2017-09-18 NOTE — Patient Instructions (Signed)
Gaze Stabilization - Tip Card  1.Target must remain in focus, not blurry, and appear stationary while head is in motion. 2.Perform exercises with small head movements (45 to either side of midline). 3.Increase speed of head motion so long as target is in focus. 4.If you wear eyeglasses, be sure you can see target through lens (therapist will give specific instructions for bifocal / progressive lenses). 5.These exercises may provoke dizziness or nausea. Work through these symptoms. If too dizzy, slow head movement slightly. Rest between each exercise. 6.Exercises demand concentration; avoid distractions. 7.For safety, perform standing exercises close to a counter, wall, corner, or next to someone.  Copyright  VHI. All rights reserved.   Gaze Stabilization - Standing Feet Apart   Feet shoulder width apart, keeping eyes on target on wall 3 feet away, tilt head down slightly and move head side to side for 30 seconds. Repeat while moving head up and down for 30 seconds. *Work up to tolerating 60 seconds, as able. Do 2-3 sessions per day.   Copyright  VHI. All rights reserved.    Gaze Stabilization: Standing Feet Partial Heel-Toe    Feet in partial heel-toe position, keeping eyes fixed on target on wall __3__ feet away, tilt head down slightly and move head side to side for _30___ seconds.  Do _2-3___ sessions per day.  Copyright  VHI. All rights reserved.   Up / Down Head Motion While Multi-Tasking    Walk on solid surface, move head and eyes toward ceiling for __2-4__ steps, then down towards the floor for 2-4 steps. Repeat sequence __10_ times per session. Do _1___ sessions per day. Repeat in dimly lit room.  Copyright  VHI. All rights reserved.  Side to Side Head Motion While Multi-Tasking    Walk on solid surface, turn head and eyes to left for __4__ steps. Then, turn head and eyes to opposite side for ___4_ steps. Repeat sequence __10__ times per session. Do _1___  sessions per day.  Copyright  VHI. All rights reserved.

## 2017-09-18 NOTE — Therapy (Signed)
[facility] [address] [place], [place], [address] Phone: [phone]   Fax:  [phone]  Physical Therapy Treatment  Patient Details  Name: Holly Obrien MRN: [id number] Date of Birth: 1951-05-13 Referring Provider: Ellouise Newer, MD  Encounter Date: 09/18/2017      PT End of Session - 09/18/17 0945    Visit Number 2   Number of Visits 6   Date for PT Re-Evaluation 10/14/17   Authorization Type Private insurance   PT Start Time [phone]   PT Stop Time 1020   PT Time Calculation (min) 44 min   Activity Tolerance Patient tolerated treatment well   Behavior During Therapy [facility] for tasks assessed/performed      Past Medical History:  Diagnosis Date  . Arthritis   . Asthma    with URI's  . Cataract   . Chronic kidney disease    kidney mass  . Complication of anesthesia    "One time, I had a hard time waking up"  . Esophagitis    HH/ERD  . GERD (gastroesophageal reflux disease)   . Glaucoma   . Hyperlipidemia    NMR 2007  . Hypothyroidism   . Thyroid disease    hypothyroidism    Past Surgical History:  Procedure Laterality Date  . ABDOMINAL HYSTERECTOMY  04/30/88   Hysterectomy and bladder tact-- Dr Molli Hazard  . CARPAL TUNNEL RELEASE  06/18/00   right wrist -- Dr Tamala Fothergill  . FINGER SURGERY  12/10/06   left index finger injury--Dr Theodis Sato  . OOPHORECTOMY  09/11/94, 01/14/96   1995-left ovary removed. 1997 Right ovary removed--Dr Molli Hazard  . PUBOVAGINAL SLING  1997   Dr Karsten Ro  . ROBOTIC ASSITED PARTIAL NEPHRECTOMY Right 02/20/2017   Procedure: XI ROBOTIC ASSITED PARTIAL NEPHRECTOMY;  Surgeon: Alexis Frock, MD;  Location: [facility];  Service: Urology;  Laterality: Right;  . TRIGGER FINGER RELEASE Right 01/2014  . TRIGGER FINGER RELEASE Left 09/2014    There were no vitals filed for this visit.      Subjective Assessment - 09/18/17 0936    Subjective Dizziness is good right now.  She  describes "periodic" symptoms and describes 1 slight episode since our last visit >2 weeks ago. She notes when not encountering an episode, she does not have unsteadiness.   No episodes of double vision.  The exercise brings on dizziness.    Pertinent History Weakness in arms and legs per report,  evidence of prior stroke on MRI (left cerebellar infarct)   Patient Stated Goals "find out what's going on"   Currently in Pain? No/denies            [facility] PT Assessment - 09/18/17 0940      Posture/Postural Control   Posture/Postural Control Postural limitations   Posture Comments During sensory organization testing note R ankle supination with lateral wearing of shoe.      Ambulation/Gait   Gait Comments Dynamic gait activities with head motion horizontal and vertical added to home for HEP.     Functional Gait  Assessment   Gait assessed  Yes   Gait Level Surface Walks 20 ft in less than 7 sec but greater than 5.5 sec, uses assistive device, slower speed, mild gait deviations, or deviates 6-10 in outside of the 12 in walkway width.  5.88 seconds   Change in Gait Speed Able to smoothly change walking speed without loss of balance or gait deviation. Deviate no more than 6  in outside of the 12 in walkway width.   Gait with Horizontal Head Turns Performs head turns smoothly with slight change in gait velocity (eg, minor disruption to smooth gait path), deviates 6-10 in outside 12 in walkway width, or uses an assistive device.   Gait with Vertical Head Turns Performs task with slight change in gait velocity (eg, minor disruption to smooth gait path), deviates 6 - 10 in outside 12 in walkway width or uses assistive device   Gait and Pivot Turn Pivot turns safely within 3 sec and stops quickly with no loss of balance.   Step Over Obstacle Is able to step over 2 stacked shoe boxes taped together (9 in total height) without changing gait speed. No evidence of imbalance.   Gait with Narrow Base of  Support Ambulates 4-7 steps.   Gait with Eyes Closed Walks 20 ft, no assistive devices, good speed, no evidence of imbalance, normal gait pattern, deviates no more than 6 in outside 12 in walkway width. Ambulates 20 ft in less than 7 sec.   Ambulating Backwards Walks 20 ft, no assistive devices, good speed, no evidence for imbalance, normal gait   Steps Alternating feet, must use rail.   Total Score 24   FGA comment: 24/30                     [facility] Adult PT Treatment/Exercise - 09/18/17 0940      Ambulation/Gait   Ambulation/Gait Yes     Neuro Re-ed    Neuro Re-ed Details  Sensory organization testing=68% composite equilibrium score (normal for age/height normative value).  The patient scores WNLs use of vestibular inputs and mildly dec'd somatosensory and visual use for balance (80% vs 85% norm for somatosensory and 70% compared to 80% norm for visual).           Vestibular Treatment/Exercise - 09/18/17 1003      Vestibular Treatment/Exercise   Vestibular Treatment Provided Gaze   Gaze Exercises X1 Viewing Horizontal     X1 Viewing Horizontal   Foot Position standing feet apart   Comments x 30 seconds with cues on where in lenses to view target (out of top portion, but not above frame of glasses).  Exercise provokes dizziness upon stopping.                PT Education - 09/18/17 1418    Education provided Yes   Education Details HEP: gaze adaptation, walking with head motion side to side and up and down   Person(s) Educated Patient   Methods Explanation;Demonstration;Handout   Comprehension Verbalized understanding;Returned demonstration             PT Long Term Goals - 09/18/17 1419      PT LONG TERM GOAL #1   Title The patient will be indep with HEP for gaze adaptation and high level/multi-sensory balance training.   Time 6   Period Weeks     PT LONG TERM GOAL #2   Title The patient will improve functional status score on FOTO survey from  50% up to 60%.   Time 6   Period Weeks     PT LONG TERM GOAL #3   Title The patient will tolerate gaze x 1 viewing x 60 seconds nonstop without c/o visual blurring.   Time 6   Period Weeks     PT LONG TERM GOAL #4   Title The patient will improve SVA vs DVA from 5 lines to 3  lines to demo improving gaze adaptation.   Time 6   Period Weeks     PT LONG TERM GOAL #5   Title Further assess sensory organization test and write goals as indicated.   Baseline Scores WNLs for overall composite equilibrium score.     Time 6   Period Weeks   Status Achieved               Plan - 09/18/17 0945    Clinical Impression Statement Sensory organization testing revealed overall WNLs score with mild deficits noted in initial performce of conditions 2 (eyes closed, conditions 4 (ground movement).  The patient compensates well and is able to score WNLs overall for testing.  She maintains weight through L posterior center of gravity alignment and this may be due to R foot supination.  PT recommended new shoes that provide greater stability.    PT Treatment/Interventions ADLs/Self Care Home Management;Therapeutic activities;Therapeutic exercise;Balance training;Neuromuscular re-education;Gait training;Functional mobility training;Patient/family education;Vestibular   PT Next Visit Plan Check HEP, dynamic gait, FGA=24/30 today (train dynamic aspects), progress gaze adaptation   Consulted and Agree with Plan of Care Patient      Patient will benefit from skilled therapeutic intervention in order to improve the following deficits and impairments:  Decreased balance, Dizziness  Visit Diagnosis: Dizziness and giddiness  Other abnormalities of gait and mobility     Problem List Patient Active Problem List   Diagnosis Date Noted  . Left renal mass 02/21/2017  . Renal mass 02/20/2017  . Muscle weakness (generalized) 01/24/2017  . Dizziness 01/24/2017  . Kidney lesion, native, right 11/29/2016   . 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 History of cerebellar stroke 11/29/2016  . Coronary arteriosclerosis in native artery 11/29/2016  . Fatty liver disease, nonalcoholic 85/88/5027  . Lumbosacral radiculopathy 10/24/2016  . Routine general medical examination at a health care facility 01/31/2016  . Family history of ischemic heart disease 07/19/2011  . Vitamin D deficiency 05/30/2010  . Myalgia and myositis 05/30/2010  . TMJ PAIN 01/02/2010  . Asthma 09/20/2009  . Hypothyroidism 06/07/2009  . Hyperlipidemia 06/07/2009  . GERD 06/07/2009    Adeana Grilliot , PT 09/18/2017, 2:24 PM  [facility] [address] [place], [place], [address] Phone: [phone]   Fax:  [phone]  Name: ENYAH MOMAN MRN: [id number] Date of Birth: 06-04-1951

## 2017-09-24 ENCOUNTER — Encounter: Payer: Federal, State, Local not specified - PPO | Admitting: Rehabilitative and Restorative Service Providers"

## 2017-10-03 ENCOUNTER — Ambulatory Visit
Payer: Federal, State, Local not specified - PPO | Attending: Neurology | Admitting: Rehabilitative and Restorative Service Providers"

## 2017-10-03 ENCOUNTER — Encounter: Payer: Self-pay | Admitting: Rehabilitative and Restorative Service Providers"

## 2017-10-03 DIAGNOSIS — R42 Dizziness and giddiness: Secondary | ICD-10-CM | POA: Diagnosis not present

## 2017-10-03 DIAGNOSIS — R2689 Other abnormalities of gait and mobility: Secondary | ICD-10-CM | POA: Diagnosis present

## 2017-10-03 NOTE — Patient Instructions (Signed)
Gaze Stabilization - Tip Card  1.Target must remain in focus, not blurry, and appear stationary while head is in motion. 2.Perform exercises with small head movements (45 to either side of midline). 3.Increase speed of head motion so long as target is in focus. 4.If you wear eyeglasses, be sure you can see target through lens (therapist will give specific instructions for bifocal / progressive lenses). 5.These exercises may provoke dizziness or nausea. Work through these symptoms. If too dizzy, slow head movement slightly. Rest between each exercise. 6.Exercises demand concentration; avoid distractions. 7.For safety, perform standing exercises close to a counter, wall, corner, or next to someone.  Copyright  VHI. All rights reserved.  Gaze Stabilization - Standing Feet Apart   Feet shoulder width apart, keeping eyes on target on wall 3 feet away, tilt head down slightly and move head side to side for 30 seconds. Repeat while moving head up and down for 30 seconds. *Work up to tolerating 60 seconds, as able. Do 2-3 sessions per day.   Copyright  VHI. All rights reserved.   Gaze Stabilization: Standing Feet Partial Heel-Toe    Feet in partial heel-toe position, keeping eyes fixed on target on wall __3__ feet away, tilt head down slightly and move head side to side for _30___ seconds.  Do _2-3___ sessions per day.  Copyright  VHI. All rights reserved.   Up / Down Head Motion While Multi-Tasking    Walk on solid surface, move head and eyes toward ceiling for __2-4__ steps, then down towards the floor for 2-4 steps. Repeat sequence __10_ times per session. Do _1___ sessions per day. Repeat in dimly lit room.  Copyright  VHI. All rights reserved.  Side to Side Head Motion While Multi-Tasking    Walk on solid surface, turn head and eyes to left for __4__ steps. Then, turn head and eyes to opposite side for ___4_ steps. Repeat sequence __10__ times per session. Do  _1___ sessions per day.  Copyright  VHI. All rights reserved.    Heel Raise: Bilateral (Standing)    IN FRONT OF A MIRROR- WATCH THE RIGHT FOOT AND KEEP ANKLE ALIGNED.  Rise on balls of feet. Can hold with fingertips for balance.  Repeat __10-20__ times per set.  Do __1__ sessions per day.  http://orth.exer.us/39   Copyright  VHI. All rights reserved.   SINGLE LIMB STANCE     IN FRONT OF A MIRROR FOR ANKLE FEEDBACK.  Stance: single leg on floor. Raise leg. Hold _10__ seconds. Repeat with other leg. _3__ reps  1x per day.  Copyright  VHI. All rights reserved.   WALKING: 10 minutes 2 times/day 5 times/week  UPPER BODY STRENGTHENING:  Strengthening: Resisted Horizontal Abduction    Hold tubing in BOTH HANDS WITH PALMS UP AND THUMBS POINTING OUT, elbow straight, arm in, parallel to floor. Pull arms out to the side through pain-free range. Repeat __10__ times per set.  Do __2 days/week.  http://orth.exer.us/837   Copyright  VHI. All rights reserved.   SHOULDER: Diagonal - Up and Away (Band)    Start with arm down and across body. Holding band, pull up and out. DIAGONAL PATTERN.  End with elbow straight and palm forward. Hold _3__ seconds. Use __YELLOW______ band. _10__ reps per set, __1_ sets per day, __2_ days per week  Copyright  VHI. All rights reserved.

## 2017-10-03 NOTE — Therapy (Signed)
[facility] [address] [place], [place], [address] Phone: [phone]   Fax:  [phone]  Physical Therapy Treatment  Patient Details  Name: Holly Obrien MRN: [id number] Date of Birth: 03/14/1951 Referring Provider: Ellouise Newer, MD   Encounter Date: 10/03/2017  PT End of Session - 10/03/17 0852    Visit Number  3    Number of Visits  6    Date for PT Re-Evaluation  10/14/17    Authorization Type  Private insurance    PT Start Time  [phone]    PT Stop Time  0930    PT Time Calculation (min)  42 min    Activity Tolerance  Patient tolerated treatment well    Behavior During Therapy  [facility] for tasks assessed/performed       Past Medical History:  Diagnosis Date  . Arthritis   . Asthma    with URI's  . Cataract   . Chronic kidney disease    kidney mass  . Complication of anesthesia    "One time, I had a hard time waking up"  . Esophagitis    HH/ERD  . GERD (gastroesophageal reflux disease)   . Glaucoma   . Hyperlipidemia    NMR 2007  . Hypothyroidism   . Thyroid disease    hypothyroidism    Past Surgical History:  Procedure Laterality Date  . ABDOMINAL HYSTERECTOMY  04/30/88   Hysterectomy and bladder tact-- Dr Molli Hazard  . CARPAL TUNNEL RELEASE  06/18/00   right wrist -- Dr Tamala Fothergill  . FINGER SURGERY  12/10/06   left index finger injury--Dr Theodis Sato  . OOPHORECTOMY  09/11/94, 01/14/96   1995-left ovary removed. 1997 Right ovary removed--Dr Molli Hazard  . PUBOVAGINAL SLING  1997   Dr Karsten Ro  . TRIGGER FINGER RELEASE Right 01/2014  . TRIGGER FINGER RELEASE Left 09/2014    There were no vitals filed for this visit.  Subjective Assessment - 10/03/17 0850    Subjective  The patient notes she got a sinus infection that has come on quickly and this makes dizziness feel worse.   She notes that heavy smells will bring on a sinus infection (she notes allergies to certain smells).  "Not dizzy, but  floating a little bit".  She has face pain from sinus pressure.   The patient slipped and fell when shopping at [employer] on Monday.     Pertinent History  Weakness in arms and legs per report,  evidence of prior stroke on MRI (left cerebellar infarct)    Patient Stated Goals  "find out what's going on"    Currently in Pain?  No/denies                      [facility] Adult PT Treatment/Exercise - 10/03/17 1125      Ambulation/Gait   Ambulation/Gait  Yes    Gait Comments  Dynamic gait activities reviewing vertical and horizontal head motion.  Patient slows pace.      Self-Care   Self-Care  Other Self-Care Comments    Other Self-Care Comments   Discussed community exercise, recommending getting into exercise routine to combat c/o muscle weakness and low energy/fatigue level.        Exercises   Exercises  Other Exercises    Other Exercises   Patient has R ankle weakness *noted last visit on SOT with supination*.  She performed bilateral heel raises today with some tendency to roll R  ankle.  REcommended perform with mirror for cues and lift within range she can control.  Recommended for HEP and also performed single limb stance x 10 seconds L and 2 seconds R due to ankle instability.    Patient c/o heaviness in limbs.  PT encouraged home exercise and added UE strengthening 2 days/week to HEP.       Vestibular Treatment/Exercise - 10/03/17 1124      Vestibular Treatment/Exercise   Vestibular Treatment Provided  Gaze    Gaze Exercises  X1 Viewing Horizontal      X1 Viewing Horizontal   Foot Position  standing feet apart and progressing ot 1/2 tandem stance    Comments  x 45 seconds nonstop with some visual bluring and dizziness noted.            PT Education - 10/03/17 1122    Education provided  Yes    Education Details  Reviewed HEP, added UE strengthening and R ankle strengthening    Person(s) Educated  Patient    Methods  Explanation;Demonstration;Handout     Comprehension  Verbalized understanding;Returned demonstration          PT Long Term Goals - 10/03/17 1122      PT LONG TERM GOAL #1   Title  The patient will be indep with HEP for gaze adaptation and high level/multi-sensory balance training.    Baseline  Met on 10/03/17    Time  6    Period  Weeks    Status  Achieved      PT LONG TERM GOAL #2   Title  The patient will improve functional status score on FOTO survey from 50% up to 60%.    Baseline  *sent to patient via e-mail 10/03/17    Time  6    Period  Weeks      PT LONG TERM GOAL #3   Title  The patient will tolerate gaze x 1 viewing x 60 seconds nonstop without c/o visual blurring.    Baseline  Patient continues with some visual blurring as she progresses towards 60 seconds.    Time  6    Period  Weeks      PT LONG TERM GOAL #4   Title  The patient will improve SVA vs DVA from 5 lines to 3 lines to demo improving gaze adaptation.    Time  6    Period  Weeks      PT LONG TERM GOAL #5   Title  Further assess sensory organization test and write goals as indicated.    Baseline  Scores WNLs for overall composite equilibrium score.      Time  6    Period  Weeks    Status  Achieved            Plan - 10/03/17 1128    Clinical Impression Statement  The patient has met 2 LTGs.  She is continuing to work on gaze and will complete FOTO survey when able via email.  PT did not schedule further vsits.  Plan to f/u by phone to determine how current HEP progressing and will determine at that time if another visit indicated.  Patient feels she is able to continue progressing activities.  She notes concern about overall arm "heaviness" and feeling weak.  PT addressed today via HEP for UEs and encouragement to participate in community wellness program.     PT Treatment/Interventions  ADLs/Self Care Home Management;Therapeutic activities;Therapeutic exercise;Balance training;Neuromuscular re-education;Gait training;Functional mobility  training;Patient/family education;Vestibular    PT Next Visit Plan  f/u by phone in 2 weeks, determine if visits needed.    Consulted and Agree with Plan of Care  Patient       Patient will benefit from skilled therapeutic intervention in order to improve the following deficits and impairments:  Decreased balance, Dizziness  Visit Diagnosis: Dizziness and giddiness  Other abnormalities of gait and mobility     Problem List Patient Active Problem List   Diagnosis Date Noted  . Left renal mass 02/21/2017  . Renal mass 02/20/2017  . Muscle weakness (generalized) 01/24/2017  . Dizziness 01/24/2017  . Kidney lesion, native, right 11/29/2016  . 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 History of cerebellar stroke 11/29/2016  . Coronary arteriosclerosis in native artery 11/29/2016  . Fatty liver disease, nonalcoholic 27/61/4709  . Lumbosacral radiculopathy 10/24/2016  . Routine general medical examination at a health care facility 01/31/2016  . Family history of ischemic heart disease 07/19/2011  . Vitamin D deficiency 05/30/2010  . Myalgia and myositis 05/30/2010  . TMJ PAIN 01/02/2010  . Asthma 09/20/2009  . Hypothyroidism 06/07/2009  . Hyperlipidemia 06/07/2009  . GERD 06/07/2009    Ayrton Mcvay, PT 10/03/2017, 11:30 AM  [facility] [address] [place], [place], [address] Phone: [phone]   Fax:  [phone]  Name: Holly Obrien MRN: [id number] Date of Birth: [birth year]

## 2017-10-30 ENCOUNTER — Telehealth: Payer: Self-pay | Admitting: Rehabilitative and Restorative Service Providers"

## 2017-10-30 NOTE — Telephone Encounter (Addendum)
-----   Message from Mervyn Gay, PT sent at 10/03/2017 11:31 AM EST ----- Last visit 10/03/17, call to determine how HEP going and if needs another visit.   PT f/u with phone call 10/30/17 to discuss how exercises going.  Patient is doing HEP, and feels things are going okay.  She currently is sick with a sinus infection.   She does not feel further visits are needed.  Ariane Ditullio, PT

## 2017-11-20 ENCOUNTER — Other Ambulatory Visit: Payer: Self-pay | Admitting: *Deleted

## 2017-11-20 NOTE — Telephone Encounter (Signed)
Opened in error

## 2017-11-29 ENCOUNTER — Other Ambulatory Visit: Payer: Self-pay | Admitting: Internal Medicine

## 2018-01-03 ENCOUNTER — Other Ambulatory Visit: Payer: Self-pay | Admitting: Internal Medicine

## 2018-01-03 DIAGNOSIS — E038 Other specified hypothyroidism: Secondary | ICD-10-CM

## 2018-01-24 ENCOUNTER — Other Ambulatory Visit: Payer: Self-pay

## 2018-01-24 ENCOUNTER — Ambulatory Visit (INDEPENDENT_AMBULATORY_CARE_PROVIDER_SITE_OTHER): Payer: Federal, State, Local not specified - PPO | Admitting: Neurology

## 2018-01-24 VITALS — BP 142/86 | HR 3 | Ht 63.0 in | Wt 220.0 lb

## 2018-01-24 DIAGNOSIS — M6281 Muscle weakness (generalized): Secondary | ICD-10-CM | POA: Diagnosis not present

## 2018-01-24 DIAGNOSIS — R42 Dizziness and giddiness: Secondary | ICD-10-CM | POA: Diagnosis not present

## 2018-01-24 NOTE — Progress Notes (Signed)
NEUROLOGY FOLLOW UP OFFICE NOTE  DIEGO DELANCEY [id number]  DOB: 07-25-1951  HISTORY OF PRESENT ILLNESS: I had the pleasure of seeing Ifrah Vest in follow-up in the neurology clinic on 01/24/2018.  The patient was last seen 6 months ago for episodic muscle weakness and dizziness. She had presented reporting intermittent brief episodes where she could not control her legs for a minute or so. She also started to notice that when she lifted her arms up, it felt like a rubber band was pulling it down. Her EMG/NCV of the left upper and both lower extremities did not show any evidence of a myopathy or neuromuscular junction disorder. There was note of a chronic L5 radiculopathy affecting both LE, mild in degree. MRI brain was unremarkable, no acute changes seen. Her MRI lumbar spine showed some mild degenerative changes, however most concerning was the abnormal signal throughout the posterior elements and L5 vertebral body consistent with diffuse sclerosis. 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   Since her last visit, she has been doing very well from a neurological standpoint. She episodes of muscle weakness have not recurred. She does feel her legs are tired. She was reporting brief episodes of dizziness and underwent vestibular therapy. She has had infrequent dizzy spells, twice while sitting down she started getting a little "swimmyheaded" and took a few deep breaths and symptoms resolved. She has low back pain. No focal numbness/tingling/weakness. She has noticed her right ear feels stopped up for the past 3 days. She denies any falls.   HPI 08/24/2016: This is a pleasant 67 yo RH woman with a history of asthma, hyperlipidemia, hypothyroidism, who presented for evaluation of episodic muscle  weakness. She reports feeling tired all the time, however over the past 3-4 years, she had been having intermittent brief episodes where she could not control her legs for a minute or so. She would have to hold on to something otherwise she could not walk. At the beginning of this month, symptoms started to affect both arms, for a week she felt that every time she would raise an arm up, it felt like a rubber band was pulling it down. She also feels this in her neck when she turns it side to side, it feels like a rubber band is pulling it back. There is a little pain on both sides of her neck. She denies any associated numbness/tingling. No recent falls or heavy lifting. She reports symptoms are on both sides, and occur even when she is sitting and reaching up for things. She has some bladder incontinence (s/p bladder sling), no bowel dysfunction. She was having severe cramps in her legs with statin, this was reduced by cutting statin dose in half. She reports a history of "silent migraines" where she could not focus and start having vertical diplopia lasting a few seconds up to a minute. She has to sit and be still and wait it out. She denies any headaches for many years. No ptosis or dysphagia/dysarthria. She has chronic low back pain and feels unsteady with walking, no falls. There is no family history of similar symptoms, however she reports her mother had "weak legs and a bad back." She denies any recent illness, travels, or immunizations.  She had bloodwork with her PCP with negative myasthenia panel, normal  CK 87, CRP and ESR, ANA negative.  PAST MEDICAL HISTORY: Past Medical History:  Diagnosis Date  . Arthritis   . Asthma    with URI's  . Cataract   . Chronic kidney disease    kidney mass  . Complication of anesthesia    "One time, I had a hard time waking up"  . Esophagitis    HH/ERD  . GERD (gastroesophageal reflux disease)   . Glaucoma   . Hyperlipidemia    NMR 2007  . Hypothyroidism    . Thyroid disease    hypothyroidism    MEDICATIONS: Current Outpatient Medications on File Prior to Visit  Medication Sig Dispense Refill  . albuterol (VENTOLIN HFA) 108 (90 Base) MCG/ACT inhaler 1-2 puffs q 4 hrs prn 18 g 2  . aspirin 81 MG chewable tablet Chew by mouth daily.    . B Complex-C (SUPER B COMPLEX PO) Take 1,000 mcg by mouth.    . Calcium Carbonate (CALCIUM 600 PO) Take by mouth.    . Cholecalciferol (VITAMIN D3) 2000 units TABS Take by mouth.    . Coenzyme Q10 (CO Q 10 PO) Take 300 mg by mouth daily.    . Echinacea-Goldenseal (ECHINACEA COMB/GOLDEN SEAL PO) Take 900 mg by mouth.    . fluticasone (FLONASE) 50 MCG/ACT nasal spray 1 spray in each nostril twice a day as needed. Use the "crossover" technique as discussed 16 g 2  . Fluticasone-Salmeterol (ADVAIR DISKUS) 250-50 MCG/DOSE AEPB Inhale 1 puff into the lungs 2 (two) times daily as needed. 60 each 2  . latanoprost (XALATAN) 0.005 % ophthalmic solution Place 1 drop into both eyes at bedtime.   3  . levothyroxine (SYNTHROID, LEVOTHROID) 50 MCG tablet Take 1 tablet (50 mcg total) by mouth daily before breakfast. Need annual visit for further refills 90 tablet 0  . Magnesium 400 MG TABS Take by mouth.    . pantoprazole (PROTONIX) 40 MG tablet TAKE 1 TABLET (40 MG TOTAL) BY MOUTH DAILY. 30 tablet 3  . PREMARIN 0.3 MG tablet TAKE 1 TABLET BY MOUTH EVERY OTHER DAY 90 tablet 3  . simvastatin (ZOCOR) 20 MG tablet TAKE 1 TABLET BY MOUTH EVERYDAY AT BEDTIME 90 tablet 1  . timolol (BETIMOL) 0.5 % ophthalmic solution Place 1 drop into both eyes daily.    . Zinc 50 MG TABS Take by mouth.     No current facility-administered medications on file prior to visit.     ALLERGIES: Allergies  Allergen Reactions  . Ciloxan [Ciprofloxacin Hcl] Swelling  . Ciprofloxacin     Rash Ciloxan per eye doctor  . Erythromycin Swelling    Face swelling  . Penicillins     Rash as child; numbness & tickling neck & head  '@22'   . Cefuroxime  Axetil     uncertain  . Sulfonamide Derivatives     ? reaction    FAMILY HISTORY: Family History  Problem Relation Age of Onset  . Arthritis Other   . Depression Other   . Thyroid disease Other        hyperthyroidism  . Stroke Other   . Heart disease Other        CABG  . Diabetes Other        hypoglycemia  . Osteoarthritis Mother   . Hyperlipidemia Mother   . Hypertension Mother   . Thyroid disease Mother   . Heart failure Father   . Cancer Father        mesothelioma  .  Heart failure Brother   . Hypertension Brother   . Hypertension Sister     SOCIAL HISTORY: Social History   Socioeconomic History  . Marital status: Married    Spouse name: Ron  . Number of children: 2  . Years of education: Not on file  . Highest education level: Not on file  Social Needs  . Financial resource strain: Not on file  . Food insecurity - worry: Not on file  . Food insecurity - inability: Not on file  . Transportation needs - medical: Not on file  . Transportation needs - non-medical: Not on file  Occupational History  . Occupation: retired Immunologist  Tobacco Use  . Smoking status: Never Smoker  . Smokeless tobacco: Never Used  Substance and Sexual Activity  . Alcohol use: No  . Drug use: No  . Sexual activity: No  Other Topics Concern  . Not on file  Social History Narrative  . Not on file    REVIEW OF SYSTEMS: Constitutional: No fevers, chills, or sweats, no generalized fatigue, change in appetite Eyes: No visual changes, double vision, eye pain Ear, nose and throat: No hearing loss, ear pain, nasal congestion, sore throat Cardiovascular: No chest pain, palpitations Respiratory:  No shortness of breath at rest or with exertion, wheezes GastrointestinaI: No nausea, vomiting, diarrhea, abdominal pain, fecal incontinence Genitourinary:  No dysuria, urinary retention or frequency Musculoskeletal:  No neck pain, +back pain Integumentary: No rash, pruritus, skin  lesions Neurological: as above Psychiatric: No depression, insomnia, anxiety Endocrine: No palpitations, fatigue, diaphoresis, mood swings, change in appetite, change in weight, increased thirst Hematologic/Lymphatic:  No anemia, purpura, petechiae. Allergic/Immunologic: no itchy/runny eyes, nasal congestion, recent allergic reactions, rashes  PHYSICAL EXAM: Vitals:   01/24/18 1544  BP: (!) 142/86  Pulse: (!) 3  SpO2: 93%   General: No acute distress Head:  Normocephalic/atraumatic Skin/Extremities: No rash, no edema Neurological Exam: alert and oriented to person, place, and time. No aphasia or dysarthria. Fund of knowledge is appropriate.  Recent and remote memory are intact.  Attention and concentration are normal.    Able to name objects and repeat phrases. Cranial nerves: Pupils equal, round. No facial asymmetry. Tongue, uvula, palate midline.  Motor: 5/5 throughout with no pronator drift. Reflexes +1 throughout. Coordination: Normal finger to nose testing. Gait narrow-based and steady. Difficulty with tandem walk (similar to prior). +Romberg test.  IMPRESSION: This is a pleasant 67 yo RH woman with a history of asthma, hyperlipidemia, hypothyroidism, who initially presented for evaluation of episodic muscle weakness. Her EMG did not show any evidence of myopathy or neuromuscular junction disorder. It did show chronic bilateral L5 radiculopathy, mild. She had an MRI lumbar spine with mild degenerative changes, and note of abnormal signal throughout the posterior elements and L5 vertebral body classic for Paget's disease per Oncology. She reports the muscle weakness episodes have not been occurring for a time now, etiology remains unclear. Her neurological exam is normal. Vestibular therapy helped with the dizzy spells, she has home exercises to do when they recur. She will follow-up on a prn basis and knows to call for any changes.   Thank you for allowing me to participate in her care.   Please do not hesitate to call for any questions or concerns.  The duration of this appointment visit was 15 minutes of face-to-face time with the patient.  Greater than 50% of this time was spent in counseling, explanation of diagnosis, planning of further management, and coordination  of care.   Ellouise Newer, M.D.   CC: Dr. Sharlet Salina

## 2018-01-24 NOTE — Patient Instructions (Signed)
Great seeing you! Continue with strengthening exercises and vestibular exercises if dizziness occurs. Follow-up as needed, call for any changes.

## 2018-01-27 ENCOUNTER — Encounter: Payer: Self-pay | Admitting: Neurology

## 2018-02-05 ENCOUNTER — Other Ambulatory Visit: Payer: Self-pay | Admitting: Internal Medicine

## 2018-02-05 DIAGNOSIS — E785 Hyperlipidemia, unspecified: Secondary | ICD-10-CM

## 2018-02-13 ENCOUNTER — Encounter: Payer: Self-pay | Admitting: Rehabilitative and Restorative Service Providers"

## 2018-02-13 NOTE — Therapy (Signed)
[facility] [address] [place], [place], [address] Phone: [phone]   Fax:  [phone]  Patient Details  Name: OCIA SIMEK MRN: [id number] Date of Birth: 1951/11/08 Referring Provider:  Ellouise Newer, MD  Encounter Date: last encounter 10/03/17  PHYSICAL THERAPY DISCHARGE SUMMARY  Visits from Start of Care: 3  Current functional level related to goals / functional outcomes: PT Long Term Goals - 10/03/17 1122      PT LONG TERM GOAL #1   Title  The patient will be indep with HEP for gaze adaptation and high level/multi-sensory balance training.    Baseline  Met on 10/03/17    Time  6    Period  Weeks    Status  Achieved      PT LONG TERM GOAL #2   Title  The patient will improve functional status score on FOTO survey from 50% up to 60%.    Baseline  *sent to patient via e-mail 10/03/17    Time  6    Period  Weeks      PT LONG TERM GOAL #3   Title  The patient will tolerate gaze x 1 viewing x 60 seconds nonstop without c/o visual blurring.    Baseline  Patient continues with some visual blurring as she progresses towards 60 seconds.    Time  6    Period  Weeks      PT LONG TERM GOAL #4   Title  The patient will improve SVA vs DVA from 5 lines to 3 lines to demo improving gaze adaptation.    Time  6    Period  Weeks      PT LONG TERM GOAL #5   Title  Further assess sensory organization test and write goals as indicated.    Baseline  Scores WNLs for overall composite equilibrium score.      Time  6    Period  Weeks    Status  Achieved         Remaining deficits: PT f/u with patient via phone and exercises going well, plan to d/c.   Education / Equipment: HEP.  Plan: Patient agrees to discharge.  Patient goals were partially met. Patient is being discharged due to meeting the stated rehab goals.  ?????       Thank you for the referral of this patient. Rudell Cobb,  MPT  Avrey Hyser 02/13/2018, 3:02 PM  [facility] [address] [place], [place], [address] Phone: [phone]   Fax:  [phone]

## 2018-02-24 ENCOUNTER — Other Ambulatory Visit: Payer: Self-pay | Admitting: Certified Nurse Midwife

## 2018-02-24 ENCOUNTER — Other Ambulatory Visit: Payer: Self-pay | Admitting: *Deleted

## 2018-02-24 DIAGNOSIS — Z1231 Encounter for screening mammogram for malignant neoplasm of breast: Secondary | ICD-10-CM

## 2018-02-24 NOTE — Telephone Encounter (Signed)
Message left to return call to Ellanor Feuerstein at [phone].    

## 2018-02-24 NOTE — Telephone Encounter (Signed)
Patient returned call to Emily. °

## 2018-02-24 NOTE — Telephone Encounter (Signed)
Medication refill request: Premarin 0.3 Last AEX:  08/16/17 DL  Next AEX: 08/26/18 Last MMG (if hormonal medication request): patient states she is going to call [facility] this week to schedule Refill authorized: 12/11/16 #90, 3 RF. Today, please advise.   Spoke with patient. RN asked if patient still taking premarin as per last AEX note, patient to be weaning off. Patient states she is down to taking premarin once a week and is taking OTC estrogen the other days. Patient states her symptoms were "iffy" when she weaned down to once a week, but "right now I'm okay." RN advised would send request to Melvia Heaps, CNM and update. Patient aware Debbi is out of the office today and will review request upon return. Aware will return call if any additional recommendations. Patient agreeable.

## 2018-02-25 MED ORDER — ESTROGENS CONJUGATED 0.3 MG PO TABS: 0.3000 mg | ORAL_TABLET | ORAL | 3 refills | Status: DC

## 2018-02-25 NOTE — Telephone Encounter (Signed)
Will do one refill since mammogram not in

## 2018-02-26 NOTE — Telephone Encounter (Signed)
Spoke with patient. Message given to patient as seen below from Melvia Heaps, CNM and she verbalized understanding. Patient is scheduled for MMG on 03/18/18.  Routing to provider for final review. Patient agreeable to disposition. Will close encounter.

## 2018-03-15 ENCOUNTER — Other Ambulatory Visit: Payer: Self-pay | Admitting: Internal Medicine

## 2018-03-18 ENCOUNTER — Ambulatory Visit
Admission: RE | Admit: 2018-03-18 | Discharge: 2018-03-18 | Disposition: A | Discharge status: Home / Self Care | Payer: Federal, State, Local not specified - PPO | Source: Ambulatory Visit | Attending: Certified Nurse Midwife | Admitting: Certified Nurse Midwife

## 2018-03-18 DIAGNOSIS — Z1231 Encounter for screening mammogram for malignant neoplasm of breast: Secondary | ICD-10-CM

## 2018-04-01 ENCOUNTER — Other Ambulatory Visit: Payer: Self-pay | Admitting: Internal Medicine

## 2018-04-01 DIAGNOSIS — E038 Other specified hypothyroidism: Secondary | ICD-10-CM

## 2018-04-08 ENCOUNTER — Encounter: Payer: Self-pay | Admitting: Internal Medicine

## 2018-04-08 ENCOUNTER — Ambulatory Visit (INDEPENDENT_AMBULATORY_CARE_PROVIDER_SITE_OTHER): Payer: Federal, State, Local not specified - PPO | Admitting: Internal Medicine

## 2018-04-08 ENCOUNTER — Encounter: Payer: Federal, State, Local not specified - PPO | Admitting: Internal Medicine

## 2018-04-08 ENCOUNTER — Other Ambulatory Visit (INDEPENDENT_AMBULATORY_CARE_PROVIDER_SITE_OTHER): Payer: Federal, State, Local not specified - PPO

## 2018-04-08 VITALS — BP 150/88 | HR 80 | Temp 98.1°F | Ht 63.0 in | Wt 217.0 lb

## 2018-04-08 DIAGNOSIS — E785 Hyperlipidemia, unspecified: Secondary | ICD-10-CM

## 2018-04-08 DIAGNOSIS — Z0000 Encounter for general adult medical examination without abnormal findings: Secondary | ICD-10-CM

## 2018-04-08 DIAGNOSIS — E038 Other specified hypothyroidism: Secondary | ICD-10-CM | POA: Diagnosis not present

## 2018-04-08 DIAGNOSIS — E559 Vitamin D deficiency, unspecified: Secondary | ICD-10-CM

## 2018-04-08 DIAGNOSIS — J4520 Mild intermittent asthma, uncomplicated: Secondary | ICD-10-CM | POA: Diagnosis not present

## 2018-04-08 DIAGNOSIS — Z23 Encounter for immunization: Secondary | ICD-10-CM

## 2018-04-08 DIAGNOSIS — K219 Gastro-esophageal reflux disease without esophagitis: Secondary | ICD-10-CM | POA: Diagnosis not present

## 2018-04-08 LAB — COMPREHENSIVE METABOLIC PANEL
ALT: 27 U/L (ref 0–35)
AST: 26 U/L (ref 0–37)
Albumin: 4.3 g/dL (ref 3.5–5.2)
Alkaline Phosphatase: 132 U/L — ABNORMAL HIGH (ref 39–117)
BUN: 18 mg/dL (ref 6–23)
CHLORIDE: 104 meq/L (ref 96–112)
CO2: 28 mEq/L (ref 19–32)
CREATININE: 0.83 mg/dL (ref 0.40–1.20)
Calcium: 9.9 mg/dL (ref 8.4–10.5)
GFR: 73.12 mL/min (ref >60.00)
GLUCOSE: 100 mg/dL — AB (ref 70–99)
Potassium: 4.8 mEq/L (ref 3.5–5.1)
SODIUM: 143 meq/L (ref 135–145)
Total Bilirubin: 0.6 mg/dL (ref 0.2–1.2)
Total Protein: 8.1 g/dL (ref 6.0–8.3)

## 2018-04-08 LAB — LIPID PANEL
CHOL/HDL RATIO: 4
Cholesterol: 156 mg/dL (ref 0–200)
HDL: 44.3 mg/dL (ref >39.00)
LDL CALC: 88 mg/dL (ref 0–99)
NonHDL: 111.47
TRIGLYCERIDES: 116 mg/dL (ref 0.0–149.0)
VLDL: 23.2 mg/dL (ref 0.0–40.0)

## 2018-04-08 LAB — CBC
HCT: 43.4 % (ref 36.0–46.0)
Hemoglobin: 14.8 g/dL (ref 12.0–15.0)
MCHC: 34 g/dL (ref 30.0–36.0)
MCV: 89.1 fl (ref 78.0–100.0)
Platelets: 284 10*3/uL (ref 150.0–400.0)
RBC: 4.87 Mil/uL (ref 3.87–5.11)
RDW: 13.6 % (ref 11.5–15.5)
WBC: 6.7 10*3/uL (ref 4.0–10.5)

## 2018-04-08 LAB — VITAMIN D 25 HYDROXY (VIT D DEFICIENCY, FRACTURES): VITD: 70.93 ng/mL (ref 30.00–100.00)

## 2018-04-08 LAB — T4, FREE: Free T4: 0.92 ng/dL (ref 0.60–1.60)

## 2018-04-08 LAB — TSH: TSH: 5.79 u[IU]/mL — ABNORMAL HIGH (ref 0.35–4.50)

## 2018-04-08 MED ORDER — LEVOTHYROXINE SODIUM 50 MCG PO TABS: 50.0000 ug | ORAL_TABLET | Freq: Every day | ORAL | 3 refills | Status: DC

## 2018-04-08 MED ORDER — SIMVASTATIN 20 MG PO TABS: 20.0000 mg | ORAL_TABLET | Freq: Every day | ORAL | 3 refills | Status: DC

## 2018-04-08 NOTE — Patient Instructions (Signed)
We have given you the pneumonia shot today.   We will have the cologuard sent to the house to screen for colon cancer.  We will get you on the waiting list for shingles vaccine shingrix.   Health Maintenance, Female Adopting a healthy lifestyle and getting preventive care can go a long way to promote health and wellness. Talk with your health care provider about what schedule of regular examinations is right for you. This is a good chance for you to check in with your provider about disease prevention and staying healthy. In between checkups, there are plenty of things you can do on your own. Experts have done a lot of research about which lifestyle changes and preventive measures are most likely to keep you healthy. Ask your health care provider for more information. Weight and diet Eat a healthy diet  Be sure to include plenty of vegetables, fruits, low-fat dairy products, and lean protein.  Do not eat a lot of foods high in solid fats, added sugars, or salt.  Get regular exercise. This is one of the most important things you can do for your health. ? Most adults should exercise for at least 150 minutes each week. The exercise should increase your heart rate and make you sweat (moderate-intensity exercise). ? Most adults should also do strengthening exercises at least twice a week. This is in addition to the moderate-intensity exercise.  Maintain a healthy weight  Body mass index (BMI) is a measurement that can be used to identify possible weight problems. It estimates body fat based on height and weight. Your health care provider can help determine your BMI and help you achieve or maintain a healthy weight.  For females 76 years of age and older: ? A BMI below 18.5 is considered underweight. ? A BMI of 18.5 to 24.9 is normal. ? A BMI of 25 to 29.9 is considered overweight. ? A BMI of 30 and above is considered obese.  Watch levels of cholesterol and blood lipids  You should start  having your blood tested for lipids and cholesterol at 66 years of age, then have this test every 5 years.  You may need to have your cholesterol levels checked more often if: ? Your lipid or cholesterol levels are high. ? You are older than 66 years of age. ? You are at high risk for heart disease.  Cancer screening Lung Cancer  Lung cancer screening is recommended for adults 52-29 years old who are at high risk for lung cancer because of a history of smoking.  A yearly low-dose CT scan of the lungs is recommended for people who: ? Currently smoke. ? Have quit within the past 15 years. ? Have at least a 30-pack-year history of smoking. A pack year is smoking an average of one pack of cigarettes a day for 1 year.  Yearly screening should continue until it has been 15 years since you quit.  Yearly screening should stop if you develop a health problem that would prevent you from having lung cancer treatment.  Breast Cancer  Practice breast self-awareness. This means understanding how your breasts normally appear and feel.  It also means doing regular breast self-exams. Let your health care provider know about any changes, no matter how small.  If you are in your 20s or 30s, you should have a clinical breast exam (CBE) by a health care provider every 1-3 years as part of a regular health exam.  If you are 40 or older, have  a CBE every year. Also consider having a breast X-ray (mammogram) every year.  If you have a family history of breast cancer, talk to your health care provider about genetic screening.  If you are at high risk for breast cancer, talk to your health care provider about having an MRI and a mammogram every year.  Breast cancer gene (BRCA) assessment is recommended for women who have family members with BRCA-related cancers. BRCA-related cancers include: ? Breast. ? Ovarian. ? Tubal. ? Peritoneal cancers.  Results of the assessment will determine the need for  genetic counseling and BRCA1 and BRCA2 testing.  Cervical Cancer Your health care provider may recommend that you be screened regularly for cancer of the pelvic organs (ovaries, uterus, and vagina). This screening involves a pelvic examination, including checking for microscopic changes to the surface of your cervix (Pap test). You may be encouraged to have this screening done every 3 years, beginning at age 49.  For women ages 14-65, health care providers may recommend pelvic exams and Pap testing every 3 years, or they may recommend the Pap and pelvic exam, combined with testing for human papilloma virus (HPV), every 5 years. Some types of HPV increase your risk of cervical cancer. Testing for HPV may also be done on women of any age with unclear Pap test results.  Other health care providers may not recommend any screening for nonpregnant women who are considered low risk for pelvic cancer and who do not have symptoms. Ask your health care provider if a screening pelvic exam is right for you.  If you have had past treatment for cervical cancer or a condition that could lead to cancer, you need Pap tests and screening for cancer for at least 20 years after your treatment. If Pap tests have been discontinued, your risk factors (such as having a new sexual partner) need to be reassessed to determine if screening should resume. Some women have medical problems that increase the chance of getting cervical cancer. In these cases, your health care provider may recommend more frequent screening and Pap tests.  Colorectal Cancer  This type of cancer can be detected and often prevented.  Routine colorectal cancer screening usually begins at 66 years of age and continues through 66 years of age.  Your health care provider may recommend screening at an earlier age if you have risk factors for colon cancer.  Your health care provider may also recommend using home test kits to check for hidden blood in the  stool.  A small camera at the end of a tube can be used to examine your colon directly (sigmoidoscopy or colonoscopy). This is done to check for the earliest forms of colorectal cancer.  Routine screening usually begins at age 70.  Direct examination of the colon should be repeated every 5-10 years through 66 years of age. However, you may need to be screened more often if early forms of precancerous polyps or small growths are found.  Skin Cancer  Check your skin from head to toe regularly.  Tell your health care provider about any new moles or changes in moles, especially if there is a change in a mole's shape or color.  Also tell your health care provider if you have a mole that is larger than the size of a pencil eraser.  Always use sunscreen. Apply sunscreen liberally and repeatedly throughout the day.  Protect yourself by wearing long sleeves, pants, a wide-brimmed hat, and sunglasses whenever you are outside.  Heart  disease, diabetes, and high blood pressure  High blood pressure causes heart disease and increases the risk of stroke. High blood pressure is more likely to develop in: ? People who have blood pressure in the high end of the normal range (130-139/85-89 mm Hg). ? People who are overweight or obese. ? People who are African American.  If you are 86-35 years of age, have your blood pressure checked every 3-5 years. If you are 73 years of age or older, have your blood pressure checked every year. You should have your blood pressure measured twice-once when you are at a hospital or clinic, and once when you are not at a hospital or clinic. Record the average of the two measurements. To check your blood pressure when you are not at a hospital or clinic, you can use: ? An automated blood pressure machine at a pharmacy. ? A home blood pressure monitor.  If you are between 28 years and 37 years old, ask your health care provider if you should take aspirin to prevent  strokes.  Have regular diabetes screenings. This involves taking a blood sample to check your fasting blood sugar level. ? If you are at a normal weight and have a low risk for diabetes, have this test once every three years after 66 years of age. ? If you are overweight and have a high risk for diabetes, consider being tested at a younger age or more often. Preventing infection Hepatitis B  If you have a higher risk for hepatitis B, you should be screened for this virus. You are considered at high risk for hepatitis B if: ? You were born in a country where hepatitis B is common. Ask your health care provider which countries are considered high risk. ? Your parents were born in a high-risk country, and you have not been immunized against hepatitis B (hepatitis B vaccine). ? You have HIV or AIDS. ? You use needles to inject street drugs. ? You live with someone who has hepatitis B. ? You have had sex with someone who has hepatitis B. ? You get hemodialysis treatment. ? You take certain medicines for conditions, including cancer, organ transplantation, and autoimmune conditions.  Hepatitis C  Blood testing is recommended for: ? Everyone born from [birth year] through 1964. ? Anyone with known risk factors for hepatitis C.  Sexually transmitted infections (STIs)  You should be screened for sexually transmitted infections (STIs) including gonorrhea and chlamydia if: ? You are sexually active and are younger than 66 years of age. ? You are older than 66 years of age and your health care provider tells you that you are at risk for this type of infection. ? Your sexual activity has changed since you were last screened and you are at an increased risk for chlamydia or gonorrhea. Ask your health care provider if you are at risk.  If you do not have HIV, but are at risk, it may be recommended that you take a prescription medicine daily to prevent HIV infection. This is called pre-exposure prophylaxis  (PrEP). You are considered at risk if: ? You are sexually active and do not regularly use condoms or know the HIV status of your partner(s). ? You take drugs by injection. ? You are sexually active with a partner who has HIV.  Talk with your health care provider about whether you are at high risk of being infected with HIV. If you choose to begin PrEP, you should first be tested for HIV. You  should then be tested every 3 months for as long as you are taking PrEP. Pregnancy  If you are premenopausal and you may become pregnant, ask your health care provider about preconception counseling.  If you may become pregnant, take 400 to 800 micrograms (mcg) of folic acid every day.  If you want to prevent pregnancy, talk to your health care provider about birth control (contraception). Osteoporosis and menopause  Osteoporosis is a disease in which the bones lose minerals and strength with aging. This can result in serious bone fractures. Your risk for osteoporosis can be identified using a bone density scan.  If you are 69 years of age or older, or if you are at risk for osteoporosis and fractures, ask your health care provider if you should be screened.  Ask your health care provider whether you should take a calcium or vitamin D supplement to lower your risk for osteoporosis.  Menopause may have certain physical symptoms and risks.  Hormone replacement therapy may reduce some of these symptoms and risks. Talk to your health care provider about whether hormone replacement therapy is right for you. Follow these instructions at home:  Schedule regular health, dental, and eye exams.  Stay current with your immunizations.  Do not use any tobacco products including cigarettes, chewing tobacco, or electronic cigarettes.  If you are pregnant, do not drink alcohol.  If you are breastfeeding, limit how much and how often you drink alcohol.  Limit alcohol intake to no more than 1 drink per day for  nonpregnant women. One drink equals 12 ounces of beer, 5 ounces of wine, or 1 ounces of hard liquor.  Do not use street drugs.  Do not share needles.  Ask your health care provider for help if you need support or information about quitting drugs.  Tell your health care provider if you often feel depressed.  Tell your health care provider if you have ever been abused or do not feel safe at home. This information is not intended to replace advice given to you by your health care provider. Make sure you discuss any questions you have with your health care provider. Document Released: 05/28/2011 Document Revised: 04/19/2016 Document Reviewed: 08/16/2015 Elsevier Interactive Patient Education  [employer].

## 2018-04-08 NOTE — Assessment & Plan Note (Signed)
Flu shot yearly. Pneumonia 23 given at visit. Shingrix added to waiting list. Cologuard ordered. Mammogram up to date, pap smear not indicated and dexa due in 2020. Tetanus up to date. Counseled about sun safety and mole surveillance. Counseled about the dangers of distracted driving. Given 10 year screening recommendations.

## 2018-04-08 NOTE — Assessment & Plan Note (Signed)
Checking lipid panel and adjust simvastatin 20 mg daily as needed.  

## 2018-04-08 NOTE — Progress Notes (Signed)
   Subjective:    Patient ID: Holly Obrien, female    DOB: [birth year], 67 y.o.   MRN: [id number]  HPI The patient is a 66 YO female coming in for physical. No new concerns.   PMH, [facility], social history reviewed and updated.   Review of Systems  Constitutional: Negative.   HENT: Negative.   Eyes: Negative.   Respiratory: Negative for cough, chest tightness and shortness of breath.   Cardiovascular: Negative for chest pain, palpitations and leg swelling.  Gastrointestinal: Negative for abdominal distention, abdominal pain, constipation, diarrhea, nausea and vomiting.  Musculoskeletal: Negative.   Skin: Negative.   Neurological: Negative.   Psychiatric/Behavioral: Negative.       Objective:   Physical Exam  Constitutional: She is oriented to person, place, and time. She appears well-developed and well-nourished.  HENT:  Head: Normocephalic and atraumatic.  Eyes: EOM are normal.  Neck: Normal range of motion.  Cardiovascular: Normal rate and regular rhythm.  Pulmonary/Chest: Effort normal and breath sounds normal. No respiratory distress. She has no wheezes. She has no rales.  Abdominal: Soft. Bowel sounds are normal. She exhibits no distension. There is no tenderness. There is no rebound.  Musculoskeletal: She exhibits no edema.  Neurological: She is alert and oriented to person, place, and time. Coordination normal.  Skin: Skin is warm and dry.  Psychiatric: She has a normal mood and affect.   Vitals:   04/08/18 0926  BP: (!) 150/88  Pulse: 80  Temp: 98.1 F (36.7 C)  TempSrc: Oral  SpO2: 98%  Weight: 217 lb (98.4 kg)  Height: 5\' 3"  (1.6 m)      Assessment & Plan:  Pneumonia 23 given at visit

## 2018-04-08 NOTE — Assessment & Plan Note (Signed)
Previously taking protonix which is not covered currently. She will continue nexium otc which she has been taking and if not effective we can find another option.

## 2018-04-08 NOTE — Assessment & Plan Note (Signed)
Checking TSH and free T4 and adjust synthroid 50 mcg daily as needed.  

## 2018-04-08 NOTE — Assessment & Plan Note (Signed)
Checking vitamin D and adjust as needed. Taking otc replacement.

## 2018-04-08 NOTE — Assessment & Plan Note (Signed)
Stable, no flares. Advair and albuterol prn. Flonase and allergy medicine for pollen season.

## 2018-04-13 ENCOUNTER — Other Ambulatory Visit: Payer: Self-pay | Admitting: Certified Nurse Midwife

## 2018-04-14 NOTE — Telephone Encounter (Signed)
Medication refill request: Premarin  Last AEX:  08-16-17  Next AEX: 08-26-18  Last MMG (if hormonal medication request): 03-18-18 WNL  Refill authorized: please advise

## 2018-04-15 LAB — COLOGUARD: COLOGUARD: NEGATIVE

## 2018-05-01 ENCOUNTER — Encounter: Payer: Self-pay | Admitting: Internal Medicine

## 2018-06-03 ENCOUNTER — Other Ambulatory Visit: Payer: Self-pay | Admitting: Certified Nurse Midwife

## 2018-06-03 NOTE — Telephone Encounter (Signed)
Medication refill request: Premarin 0.3 mg Tab Last AEX:  08/16/17 Next AEX: 08/26/18 Last MMG (if hormonal medication request): 03/18/18 Bi rads Category 1 Neg  Refill authorized: A 90 day supply is being requested. Please refill if appropriate.

## 2018-06-05 ENCOUNTER — Ambulatory Visit (INDEPENDENT_AMBULATORY_CARE_PROVIDER_SITE_OTHER): Payer: Federal, State, Local not specified - PPO | Admitting: *Deleted

## 2018-06-05 DIAGNOSIS — Z23 Encounter for immunization: Secondary | ICD-10-CM | POA: Diagnosis not present

## 2018-06-30 ENCOUNTER — Other Ambulatory Visit: Payer: Self-pay | Admitting: Internal Medicine

## 2018-06-30 DIAGNOSIS — J4521 Mild intermittent asthma with (acute) exacerbation: Secondary | ICD-10-CM

## 2018-07-11 ENCOUNTER — Other Ambulatory Visit: Payer: Self-pay | Admitting: Internal Medicine

## 2018-07-11 DIAGNOSIS — J310 Chronic rhinitis: Secondary | ICD-10-CM

## 2018-07-11 MED ORDER — FLUTICASONE PROPIONATE 50 MCG/ACT NA SUSP: NASAL | 2 refills | Status: DC

## 2018-07-11 NOTE — Telephone Encounter (Signed)
Rx refill request: fluticasone 50 MCG/ACT     Outside provider  Last ordered: 09/22/15  LOV: 04/08/18  PCP: [place]: verified

## 2018-07-11 NOTE — Telephone Encounter (Signed)
Copied from [place] [id number]. Topic: Quick Communication - See Telephone Encounter >> Jul 11, 2018  9:00 AM Mylinda Latina, NT wrote: CRM for notification. See Telephone encounter for: 07/11/18. Patient called and states she needs a refill of her fluticasone [facility]) 50 MCG/ACT nasal spray  [id number] [address], [place] [phone] (Phone) [phone] (Fax)

## 2018-07-11 NOTE — Telephone Encounter (Signed)
Pt informed

## 2018-08-03 ENCOUNTER — Other Ambulatory Visit: Payer: Self-pay | Admitting: Internal Medicine

## 2018-08-03 DIAGNOSIS — E785 Hyperlipidemia, unspecified: Secondary | ICD-10-CM

## 2018-08-21 ENCOUNTER — Ambulatory Visit (INDEPENDENT_AMBULATORY_CARE_PROVIDER_SITE_OTHER): Payer: Federal, State, Local not specified - PPO

## 2018-08-21 DIAGNOSIS — Z23 Encounter for immunization: Secondary | ICD-10-CM | POA: Diagnosis not present

## 2018-08-26 ENCOUNTER — Ambulatory Visit: Payer: Federal, State, Local not specified - PPO | Admitting: Certified Nurse Midwife

## 2018-08-26 ENCOUNTER — Other Ambulatory Visit: Payer: Self-pay

## 2018-08-26 ENCOUNTER — Encounter: Payer: Self-pay | Admitting: Certified Nurse Midwife

## 2018-08-26 VITALS — BP 128/82 | HR 70 | Resp 16 | Ht 63.0 in | Wt 221.0 lb

## 2018-08-26 DIAGNOSIS — Z8639 Personal history of other endocrine, nutritional and metabolic disease: Secondary | ICD-10-CM

## 2018-08-26 DIAGNOSIS — Z01419 Encounter for gynecological examination (general) (routine) without abnormal findings: Secondary | ICD-10-CM

## 2018-08-26 DIAGNOSIS — N951 Menopausal and female climacteric states: Secondary | ICD-10-CM

## 2018-08-26 NOTE — Patient Instructions (Signed)

## 2018-08-26 NOTE — Progress Notes (Signed)
67 y.o. G40P2002 Married  Caucasian Fe here for annual exam. Menopausal, still having hot flashes. Still taking Premarin every 10 days, when she tried to decrease or wears off, hot flashes return. Taking Estroven daily and feels this does help. Denies vaginal dryness, not sexually active. Sees PCP for Hypothyroid management stable at present, cholesterol, asthma management and labs. Had Cologard this year, which was negative. Occasional urinary stress incontinence with cough. Denies leakage or daily pad wetness. Had been exercising which she felt had helped, but busy with caring for mother and unable to participate. Plans to start back. No other health issues today.  Patient's last menstrual period was 05/01/1987.          Sexually active: No.  The current method of family planning is status post hysterectomy.    Exercising: Yes.    walking Smoker:  no  Review of Systems  Constitutional:       Weight gain  HENT: Negative.   Eyes: Negative.   Respiratory: Negative.   Cardiovascular: Negative.   Gastrointestinal: Negative.   Genitourinary: Negative.   Musculoskeletal: Negative.   Skin: Negative.   Neurological: Negative.   Endo/Heme/Allergies: Negative.   Psychiatric/Behavioral: Negative.     Health Maintenance: Pap:  04-23-01 neg History of Abnormal Pap: no MMG:  03-18-18 category c density birads 1:neg Self Breast exams: yes Colonoscopy:  2008 or 2009, cologard 2019 neg BMD:   2015 TDaP:  2018 Shingles: 2019 Pneumonia: 2019 Hep C and HIV: not done Labs: PCP   reports that she has never smoked. She has never used smokeless tobacco. She reports that she does not drink alcohol or use drugs.  Past Medical History:  Diagnosis Date  . Arthritis   . Asthma    with URI's  . Cataract   . Chronic kidney disease    kidney mass  . Complication of anesthesia    "One time, I had a hard time waking up"  . Esophagitis    HH/ERD  . GERD (gastroesophageal reflux disease)   . Glaucoma    . Hyperlipidemia    NMR 2007  . Hypothyroidism   . Thyroid disease    hypothyroidism    Past Surgical History:  Procedure Laterality Date  . ABDOMINAL HYSTERECTOMY  04/30/88   Hysterectomy and bladder tact-- Dr Molli Hazard  . CARPAL TUNNEL RELEASE  06/18/00   right wrist -- Dr Tamala Fothergill  . FINGER SURGERY  12/10/06   left index finger injury--Dr Theodis Sato  . OOPHORECTOMY  09/11/94, 01/14/96   1995-left ovary removed. 1997 Right ovary removed--Dr Molli Hazard  . PUBOVAGINAL SLING  1997   Dr Karsten Ro  . ROBOTIC ASSITED PARTIAL NEPHRECTOMY Right 02/20/2017   Procedure: XI ROBOTIC ASSITED PARTIAL NEPHRECTOMY;  Surgeon: Alexis Frock, MD;  Location: [facility];  Service: Urology;  Laterality: Right;  . TRIGGER FINGER RELEASE Right 01/2014  . TRIGGER FINGER RELEASE Left 09/2014    Current Outpatient Medications  Medication Sig Dispense Refill  . albuterol (VENTOLIN HFA) 108 (90 Base) MCG/ACT inhaler USE 1-2 PUFFS EVERY 4 HRS AS NEEDED 18 Inhaler 2  . aspirin 81 MG chewable tablet Chew by mouth daily.    . B Complex-C (SUPER B COMPLEX PO) Take 1,000 mcg by mouth.    . Calcium Carbonate (CALCIUM 600 PO) Take by mouth.    . Cholecalciferol (VITAMIN D3) 2000 units TABS Take by mouth.    . Coenzyme Q10 (CO Q 10 PO) Take 300 mg by mouth daily.    Marland Kitchen  Echinacea-Goldenseal (ECHINACEA COMB/GOLDEN SEAL PO) Take 900 mg by mouth.    . fluticasone (FLONASE) 50 MCG/ACT nasal spray 1 spray in each nostril twice a day as needed. Use the "crossover" technique as discussed 16 g 2  . Fluticasone-Salmeterol (ADVAIR DISKUS) 250-50 MCG/DOSE AEPB Inhale 1 puff into the lungs 2 (two) times daily as needed. 60 each 2  . Krill Oil (OMEGA-3) 500 MG CAPS     . latanoprost (XALATAN) 0.005 % ophthalmic solution Place 1 drop into both eyes at bedtime.   3  . levothyroxine (SYNTHROID, LEVOTHROID) 50 MCG tablet Take 1 tablet (50 mcg total) by mouth daily before breakfast. 90 tablet 3  . Magnesium 400 MG TABS Take by mouth.     . Misc Natural Products (ESTROVEN ENERGY PO) Take by mouth.    . pantoprazole (PROTONIX) 40 MG tablet Take 1 tablet (40 mg total) by mouth daily. Need annual appointment for further refills 30 tablet 0  . PREMARIN 0.3 MG tablet TAKE 1 TABLET (0.3 MG TOTAL) BY MOUTH ONCE A WEEK. CONTINUE TO WEAN DOWN 90 tablet 0  . simvastatin (ZOCOR) 20 MG tablet Take 1 tablet (20 mg total) by mouth daily at 6 PM. 90 tablet 3  . timolol (BETIMOL) 0.5 % ophthalmic solution Place 1 drop into both eyes daily.    . Zinc 50 MG TABS Take by mouth.     No current facility-administered medications for this visit.     Family History  Problem Relation Age of Onset  . Arthritis Other   . Depression Other   . Thyroid disease Other        hyperthyroidism  . Stroke Other   . Heart disease Other        CABG  . Diabetes Other        hypoglycemia  . Osteoarthritis Mother   . Hyperlipidemia Mother   . Hypertension Mother   . Thyroid disease Mother   . Heart failure Father   . Cancer Father        mesothelioma  . Heart failure Brother   . Hypertension Brother   . Hypertension Sister     ROS:  Pertinent items are noted in HPI.  Otherwise, a comprehensive ROS was negative.  Exam:   LMP 05/01/1987    Ht Readings from Last 3 Encounters:  04/08/18 5\' 3"  (1.6 m)  01/24/18 5\' 3"  (1.6 m)  08/16/17 5' 2.75" (1.594 m)    General appearance: alert, cooperative and appears stated age, overweight Head: Normocephalic, without obvious abnormality, atraumatic Neck: no adenopathy, supple, symmetrical, trachea midline and thyroid normal to inspection and palpation Lungs: clear to auscultation bilaterally Breasts: normal appearance, no masses or tenderness, No nipple retraction or dimpling, No nipple discharge or bleeding, No axillary or supraclavicular adenopathy Heart: regular rate and rhythm Abdomen: soft, non-tender; no masses,  no organomegaly Extremities: extremities normal, atraumatic, no cyanosis or  edema Skin: Skin color, texture, turgor normal. No rashes or lesions Lymph nodes: Cervical, supraclavicular, and axillary nodes normal. No abnormal inguinal nodes palpated Neurologic: Grossly normal   Pelvic: External genitalia:  no lesions, normal female              Urethra:  normal appearing urethra with no masses, tenderness or lesions              Bartholin's and Skene's: normal                 Vagina: normal appearing vagina with normal color and  discharge, no lesions, mild rectocele noted, support fair              Cervix: absent              Pap taken: No. Bimanual Exam:  Uterus:  uterus absent              Adnexa: no mass, fullness, tenderness and adnexa surgically absent               Rectovaginal: Confirms               Anus:  normal sphincter tone, no lesions  Chaperone present: yes  A:  Well Woman with normal exam  Menopausal s/p TAH, BSO(later) bleeding Premarin use twice monthly and Estroven daily  Stress incontinence, sporadic  Hyperlipidemia, Hypothyroid with PCP management  Overweight, plans to start exercise again  P:   Reviewed health and wellness pertinent to exam  Discussed risks/benefits and warning signs with Premarin oral use and feel not really beneficial at this point and maybe making hot flashes worse. Discussed together today and agreed stopping was good choice. No Rx given. Continue daily estroven use and aware of warning signs.  Work on [employer] exercise which will help with this, limit soda intake.  Continue follow up with PCP as indicated.  Pap smear: no   counseled on breast self exam, mammography screening, feminine hygiene, adequate intake of calcium and vitamin D, diet and exercise, kegel exercises  return annually or prn  An After Visit Summary was printed and given to the patient.

## 2018-09-05 ENCOUNTER — Other Ambulatory Visit: Payer: Self-pay | Admitting: Urology

## 2018-09-05 ENCOUNTER — Ambulatory Visit (HOSPITAL_COMMUNITY)
Admission: RE | Admit: 2018-09-05 | Discharge: 2018-09-05 | Disposition: A | Discharge status: Home / Self Care | Payer: Federal, State, Local not specified - PPO | Source: Ambulatory Visit | Attending: Urology | Admitting: Urology

## 2018-09-05 DIAGNOSIS — C641 Malignant neoplasm of right kidney, except renal pelvis: Secondary | ICD-10-CM

## 2019-04-06 ENCOUNTER — Other Ambulatory Visit: Payer: Self-pay | Admitting: Internal Medicine

## 2019-04-06 ENCOUNTER — Ambulatory Visit (INDEPENDENT_AMBULATORY_CARE_PROVIDER_SITE_OTHER): Payer: Federal, State, Local not specified - PPO | Admitting: Internal Medicine

## 2019-04-06 ENCOUNTER — Encounter: Payer: Self-pay | Admitting: Internal Medicine

## 2019-04-06 DIAGNOSIS — E038 Other specified hypothyroidism: Secondary | ICD-10-CM

## 2019-04-06 DIAGNOSIS — J4520 Mild intermittent asthma, uncomplicated: Secondary | ICD-10-CM

## 2019-04-06 DIAGNOSIS — E782 Mixed hyperlipidemia: Secondary | ICD-10-CM

## 2019-04-06 MED ORDER — LEVOTHYROXINE SODIUM 50 MCG PO TABS: 50.0000 ug | ORAL_TABLET | Freq: Every day | ORAL | 3 refills | Status: DC

## 2019-04-06 MED ORDER — SIMVASTATIN 20 MG PO TABS: 20.0000 mg | ORAL_TABLET | Freq: Every day | ORAL | 3 refills | Status: DC

## 2019-04-06 NOTE — Telephone Encounter (Signed)
Can you call and see if patient can do a virtual visit to discuss medications since it has almost been a year. If they cannot I can do a 90 day supply and then schedule for annual exam

## 2019-04-06 NOTE — Progress Notes (Signed)
Virtual Visit via Audio-only, failed video Note  I connected with Holly Obrien on 04/06/19 at  3:40 PM EDT by a audio only enabled telemedicine application due to failure of video device for visit and verified that I am speaking with the correct person using two identifiers.  The patient and the provider were at separate locations throughout the entire encounter.   I discussed the limitations of evaluation and management by telemedicine and the availability of in person appointments. The patient expressed understanding and agreed to proceed.  History of Present Illness: The patient is a 68 y.o. female with visit for follow up asthma (uses albuterol very rarely, has current inhaler, uses flonase prn and mucinex, has advair which she uses as needed for flares, denies current flare or SOB or cough, denies fevers or chills) and cholesterol (taking simvastatin daily, denies chest pains or stroke symptoms, denies side effects) and thyroid (taking synthroid 50 mcg daily, denies heat or cold intolerance, denies weight changes unexpected).  Observations/Objective: Voice strong and no dyspnea while talking  Assessment and Plan: See problem oriented charting  Follow Up Instructions: refill as needed, try in person visit for physical in 3-6 months  Visit time 25 minutes: greater than 50% of that time was spent in face to face counseling and coordination of care with the patient: counseled about her various medical conditions as well as plan for the future  I discussed the assessment and treatment plan with the patient. The patient was provided an opportunity to ask questions and all were answered. The patient agreed with the plan and demonstrated an understanding of the instructions.   The patient was advised to call back or seek an in-person evaluation if the symptoms worsen or if the condition fails to improve as anticipated.  Hoyt Koch, MD

## 2019-04-06 NOTE — Assessment & Plan Note (Signed)
Taking synthroid 50 mcg daily. Will defer labs 3-6 months given national pandemic. Refilled synthroid.

## 2019-04-06 NOTE — Assessment & Plan Note (Signed)
Uses advair prn and albuterol prn. No flare today.

## 2019-04-06 NOTE — Assessment & Plan Note (Signed)
Will defer labs for 3-6 months due to pandemic. Refilled simvastatin and needs to continue given prior stroke.

## 2019-04-06 NOTE — Telephone Encounter (Signed)
Left message for patient to call back to schedule.  °

## 2019-08-10 ENCOUNTER — Other Ambulatory Visit: Payer: Self-pay

## 2019-08-10 ENCOUNTER — Ambulatory Visit (INDEPENDENT_AMBULATORY_CARE_PROVIDER_SITE_OTHER): Payer: Federal, State, Local not specified - PPO

## 2019-08-10 DIAGNOSIS — Z23 Encounter for immunization: Secondary | ICD-10-CM

## 2019-08-28 ENCOUNTER — Ambulatory Visit: Payer: Federal, State, Local not specified - PPO | Admitting: Certified Nurse Midwife

## 2019-09-08 ENCOUNTER — Other Ambulatory Visit: Payer: Self-pay

## 2019-09-08 ENCOUNTER — Other Ambulatory Visit (HOSPITAL_COMMUNITY): Payer: Self-pay | Admitting: Urology

## 2019-09-08 ENCOUNTER — Ambulatory Visit (HOSPITAL_COMMUNITY)
Admission: RE | Admit: 2019-09-08 | Discharge: 2019-09-08 | Disposition: A | Discharge status: Home / Self Care | Payer: Federal, State, Local not specified - PPO | Source: Ambulatory Visit | Attending: Urology | Admitting: Urology

## 2019-09-08 DIAGNOSIS — C641 Malignant neoplasm of right kidney, except renal pelvis: Secondary | ICD-10-CM | POA: Insufficient documentation

## 2019-09-30 ENCOUNTER — Other Ambulatory Visit (INDEPENDENT_AMBULATORY_CARE_PROVIDER_SITE_OTHER): Payer: Federal, State, Local not specified - PPO

## 2019-09-30 ENCOUNTER — Other Ambulatory Visit: Payer: Self-pay

## 2019-09-30 ENCOUNTER — Encounter: Payer: Self-pay | Admitting: Internal Medicine

## 2019-09-30 ENCOUNTER — Ambulatory Visit (INDEPENDENT_AMBULATORY_CARE_PROVIDER_SITE_OTHER): Payer: Federal, State, Local not specified - PPO | Admitting: Internal Medicine

## 2019-09-30 VITALS — BP 134/90 | HR 69 | Temp 98.0°F | Ht 63.0 in | Wt 218.0 lb

## 2019-09-30 DIAGNOSIS — Z8673 Personal history of transient ischemic attack (TIA), and cerebral infarction without residual deficits: Secondary | ICD-10-CM | POA: Diagnosis not present

## 2019-09-30 DIAGNOSIS — Z0000 Encounter for general adult medical examination without abnormal findings: Secondary | ICD-10-CM | POA: Diagnosis not present

## 2019-09-30 DIAGNOSIS — E038 Other specified hypothyroidism: Secondary | ICD-10-CM

## 2019-09-30 DIAGNOSIS — E559 Vitamin D deficiency, unspecified: Secondary | ICD-10-CM

## 2019-09-30 DIAGNOSIS — J4520 Mild intermittent asthma, uncomplicated: Secondary | ICD-10-CM

## 2019-09-30 LAB — CBC
HCT: 43.4 % (ref 36.0–46.0)
Hemoglobin: 14.3 g/dL (ref 12.0–15.0)
MCHC: 33 g/dL (ref 30.0–36.0)
MCV: 89.2 fl (ref 78.0–100.0)
Platelets: 294 10*3/uL (ref 150.0–400.0)
RBC: 4.86 Mil/uL (ref 3.87–5.11)
RDW: 13.5 % (ref 11.5–15.5)
WBC: 7.2 10*3/uL (ref 4.0–10.5)

## 2019-09-30 LAB — COMPREHENSIVE METABOLIC PANEL
ALT: 29 U/L (ref 0–35)
AST: 25 U/L (ref 0–37)
Albumin: 4.4 g/dL (ref 3.5–5.2)
Alkaline Phosphatase: 147 U/L — ABNORMAL HIGH (ref 39–117)
BUN: 19 mg/dL (ref 6–23)
CO2: 30 mEq/L (ref 19–32)
Calcium: 9.8 mg/dL (ref 8.4–10.5)
Chloride: 103 mEq/L (ref 96–112)
Creatinine, Ser: 0.94 mg/dL (ref 0.40–1.20)
GFR: 59.32 mL/min — ABNORMAL LOW (ref >60.00)
Glucose, Bld: 108 mg/dL — ABNORMAL HIGH (ref 70–99)
Potassium: 5 mEq/L (ref 3.5–5.1)
Sodium: 139 mEq/L (ref 135–145)
Total Bilirubin: 0.6 mg/dL (ref 0.2–1.2)
Total Protein: 8 g/dL (ref 6.0–8.3)

## 2019-09-30 LAB — LIPID PANEL
Cholesterol: 142 mg/dL (ref 0–200)
HDL: 37.4 mg/dL — ABNORMAL LOW (ref >39.00)
LDL Cholesterol: 81 mg/dL (ref 0–99)
NonHDL: 104.6
Total CHOL/HDL Ratio: 4
Triglycerides: 118 mg/dL (ref 0.0–149.0)
VLDL: 23.6 mg/dL (ref 0.0–40.0)

## 2019-09-30 LAB — TSH: TSH: 5.52 u[IU]/mL — ABNORMAL HIGH (ref 0.35–4.50)

## 2019-09-30 LAB — T4, FREE: Free T4: 0.91 ng/dL (ref 0.60–1.60)

## 2019-09-30 LAB — VITAMIN D 25 HYDROXY (VIT D DEFICIENCY, FRACTURES): VITD: 69.78 ng/mL (ref 30.00–100.00)

## 2019-09-30 LAB — HEMOGLOBIN A1C: Hgb A1c MFr Bld: 6.1 % (ref 4.6–6.5)

## 2019-09-30 NOTE — Progress Notes (Signed)
   Subjective:   Patient ID: Holly Obrien, female    DOB: 1951/07/26, 68 y.o.   MRN: [id number]  HPI The patient is a 68 YO female coming in for physical.   PMH, [place], social history reviewed and updated  Review of Systems  Constitutional: Negative.   HENT: Negative.   Eyes: Negative.   Respiratory: Negative for cough, chest tightness and shortness of breath.   Cardiovascular: Negative for chest pain, palpitations and leg swelling.  Gastrointestinal: Negative for abdominal distention, abdominal pain, constipation, diarrhea, nausea and vomiting.  Musculoskeletal: Negative.   Skin: Negative.   Neurological: Negative.   Psychiatric/Behavioral: Negative.     Objective:  Physical Exam Constitutional:      Appearance: She is well-developed.  HENT:     Head: Normocephalic and atraumatic.  Neck:     Musculoskeletal: Normal range of motion.  Cardiovascular:     Rate and Rhythm: Normal rate and regular rhythm.  Pulmonary:     Effort: Pulmonary effort is normal. No respiratory distress.     Breath sounds: Normal breath sounds. No wheezing or rales.  Abdominal:     General: Bowel sounds are normal. There is no distension.     Palpations: Abdomen is soft.     Tenderness: There is no abdominal tenderness. There is no rebound.  Skin:    General: Skin is warm and dry.  Neurological:     Mental Status: She is alert and oriented to person, place, and time.     Coordination: Coordination normal.     Vitals:   09/30/19 1036 09/30/19 1108  BP: (!) 148/100 134/90  Pulse: 69   Temp: 98 F (36.7 C)   TempSrc: Oral   SpO2: 97%   Weight: 218 lb (98.9 kg)   Height: 5\' 3"  (1.6 m)     Assessment & Plan:

## 2019-09-30 NOTE — Patient Instructions (Signed)
Health Maintenance, Female Adopting a healthy lifestyle and getting preventive care are important in promoting health and wellness. Ask your health care provider about:  The right schedule for you to have regular tests and exams.  Things you can do on your own to prevent diseases and keep yourself healthy. What should I know about diet, weight, and exercise? Eat a healthy diet   Eat a diet that includes plenty of vegetables, fruits, low-fat dairy products, and lean protein.  Do not eat a lot of foods that are high in solid fats, added sugars, or sodium. Maintain a healthy weight Body mass index (BMI) is used to identify weight problems. It estimates body fat based on height and weight. Your health care provider can help determine your BMI and help you achieve or maintain a healthy weight. Get regular exercise Get regular exercise. This is one of the most important things you can do for your health. Most adults should:  Exercise for at least 150 minutes each week. The exercise should increase your heart rate and make you sweat (moderate-intensity exercise).  Do strengthening exercises at least twice a week. This is in addition to the moderate-intensity exercise.  Spend less time sitting. Even light physical activity can be beneficial. Watch cholesterol and blood lipids Have your blood tested for lipids and cholesterol at 67 years of age, then have this test every 5 years. Have your cholesterol levels checked more often if:  Your lipid or cholesterol levels are high.  You are older than 67 years of age.  You are at high risk for heart disease. What should I know about cancer screening? Depending on your health history and family history, you may need to have cancer screening at various ages. This may include screening for:  Breast cancer.  Cervical cancer.  Colorectal cancer.  Skin cancer.  Lung cancer. What should I know about heart disease, diabetes, and high blood  pressure? Blood pressure and heart disease  High blood pressure causes heart disease and increases the risk of stroke. This is more likely to develop in people who have high blood pressure readings, are of African descent, or are overweight.  Have your blood pressure checked: ? Every 3-5 years if you are 18-39 years of age. ? Every year if you are 40 years old or older. Diabetes Have regular diabetes screenings. This checks your fasting blood sugar level. Have the screening done:  Once every three years after age 40 if you are at a normal weight and have a low risk for diabetes.  More often and at a younger age if you are overweight or have a high risk for diabetes. What should I know about preventing infection? Hepatitis B If you have a higher risk for hepatitis B, you should be screened for this virus. Talk with your health care provider to find out if you are at risk for hepatitis B infection. Hepatitis C Testing is recommended for:  Everyone born from 1945 through 1965.  Anyone with known risk factors for hepatitis C. Sexually transmitted infections (STIs)  Get screened for STIs, including gonorrhea and chlamydia, if: ? You are sexually active and are younger than 67 years of age. ? You are older than 67 years of age and your health care provider tells you that you are at risk for this type of infection. ? Your sexual activity has changed since you were last screened, and you are at increased risk for chlamydia or gonorrhea. Ask your health care provider if   you are at risk.  Ask your health care provider about whether you are at high risk for HIV. Your health care provider may recommend a prescription medicine to help prevent HIV infection. If you choose to take medicine to prevent HIV, you should first get tested for HIV. You should then be tested every 3 months for as long as you are taking the medicine. Pregnancy  If you are about to stop having your period (premenopausal) and  you may become pregnant, seek counseling before you get pregnant.  Take 400 to 800 micrograms (mcg) of folic acid every day if you become pregnant.  Ask for birth control (contraception) if you want to prevent pregnancy. Osteoporosis and menopause Osteoporosis is a disease in which the bones lose minerals and strength with aging. This can result in bone fractures. If you are 65 years old or older, or if you are at risk for osteoporosis and fractures, ask your health care provider if you should:  Be screened for bone loss.  Take a calcium or vitamin D supplement to lower your risk of fractures.  Be given hormone replacement therapy (HRT) to treat symptoms of menopause. Follow these instructions at home: Lifestyle  Do not use any products that contain nicotine or tobacco, such as cigarettes, e-cigarettes, and chewing tobacco. If you need help quitting, ask your health care provider.  Do not use street drugs.  Do not share needles.  Ask your health care provider for help if you need support or information about quitting drugs. Alcohol use  Do not drink alcohol if: ? Your health care provider tells you not to drink. ? You are pregnant, may be pregnant, or are planning to become pregnant.  If you drink alcohol: ? Limit how much you use to 0-1 drink a day. ? Limit intake if you are breastfeeding.  Be aware of how much alcohol is in your drink. In the [place] one drink equals one 12 oz bottle of beer (355 mL), one 5 oz glass of wine (148 mL), or one 1 oz glass of hard liquor (44 mL). General instructions  Schedule regular health, dental, and eye exams.  Stay current with your vaccines.  Tell your health care provider if: ? You often feel depressed. ? You have ever been abused or do not feel safe at home. Summary  Adopting a healthy lifestyle and getting preventive care are important in promoting health and wellness.  Follow your health care provider's instructions about healthy  diet, exercising, and getting tested or screened for diseases.  Follow your health care provider's instructions on monitoring your cholesterol and blood pressure. This information is not intended to replace advice given to you by your health care provider. Make sure you discuss any questions you have with your health care provider. Document Released: 05/28/2011 Document Revised: 11/05/2018 Document Reviewed: 11/05/2018 Elsevier Patient Education  2020 Elsevier Inc.  

## 2019-10-02 NOTE — Assessment & Plan Note (Signed)
Uses advair prn and albuterol prn, no flare today.

## 2019-10-02 NOTE — Assessment & Plan Note (Signed)
Needs HgA1c today to rule out DM, BP at goal. Taking aspirin daily for prevention and statin.

## 2019-10-02 NOTE — Assessment & Plan Note (Signed)
Checking TSH and free T4 and adjust synthroid as needed.

## 2019-10-02 NOTE — Assessment & Plan Note (Signed)
Checking vitamin D level and adjust as needed.  

## 2020-01-16 ENCOUNTER — Ambulatory Visit: Payer: Federal, State, Local not specified - PPO | Attending: Internal Medicine

## 2020-01-16 DIAGNOSIS — Z23 Encounter for immunization: Secondary | ICD-10-CM

## 2020-01-16 NOTE — Progress Notes (Signed)
   Covid-19 Vaccination Clinic  Name:  Holly Obrien    MRN: [id number] DOB: 1951-09-24  01/16/2020  [name] was observed post Covid-19 immunization for 15 minutes without incidence. She was provided with Vaccine Information Sheet and instruction to access the V-Safe system.   [name] was instructed to call 911 with any severe reactions post vaccine: Marland Kitchen Difficulty breathing  . Swelling of your face and throat  . A fast heartbeat  . A bad rash all over your body  . Dizziness and weakness    Immunizations Administered    Name Date Dose VIS Date Route   Pfizer COVID-19 Vaccine 01/16/2020 11:06 AM 0.3 mL 11/06/2019 Intramuscular   Manufacturer: [place]   Lot: [id number]   [place]: [id number]

## 2020-02-09 ENCOUNTER — Ambulatory Visit: Payer: Federal, State, Local not specified - PPO | Attending: Internal Medicine

## 2020-02-09 DIAGNOSIS — Z23 Encounter for immunization: Secondary | ICD-10-CM

## 2020-02-09 NOTE — Progress Notes (Signed)
   Covid-19 Vaccination Clinic  Name:  Holly Obrien    MRN: [id number] DOB: July 02, 1951  02/09/2020  [name] was observed post Covid-19 immunization for 15 minutes without incident. She was provided with Vaccine Information Sheet and instruction to access the V-Safe system.   [name] was instructed to call 911 with any severe reactions post vaccine: Marland Kitchen Difficulty breathing  . Swelling of face and throat  . A fast heartbeat  . A bad rash all over body  . Dizziness and weakness   Immunizations Administered    Name Date Dose VIS Date Route   Pfizer COVID-19 Vaccine 02/09/2020 10:47 AM 0.3 mL 11/06/2019 Intramuscular   Manufacturer: [place]   Lot: [id number]   [place]: [id number]

## 2020-02-16 ENCOUNTER — Encounter: Payer: Self-pay | Admitting: Certified Nurse Midwife

## 2020-04-01 ENCOUNTER — Other Ambulatory Visit: Payer: Self-pay | Admitting: Internal Medicine

## 2020-04-01 DIAGNOSIS — E038 Other specified hypothyroidism: Secondary | ICD-10-CM

## 2020-04-20 ENCOUNTER — Telehealth (INDEPENDENT_AMBULATORY_CARE_PROVIDER_SITE_OTHER): Payer: Federal, State, Local not specified - PPO | Admitting: Family

## 2020-04-20 ENCOUNTER — Other Ambulatory Visit (INDEPENDENT_AMBULATORY_CARE_PROVIDER_SITE_OTHER): Payer: Federal, State, Local not specified - PPO

## 2020-04-20 DIAGNOSIS — J019 Acute sinusitis, unspecified: Secondary | ICD-10-CM | POA: Diagnosis not present

## 2020-04-20 DIAGNOSIS — E038 Other specified hypothyroidism: Secondary | ICD-10-CM | POA: Diagnosis not present

## 2020-04-20 LAB — COMPREHENSIVE METABOLIC PANEL
ALT: 27 U/L (ref 0–35)
AST: 24 U/L (ref 0–37)
Albumin: 4.4 g/dL (ref 3.5–5.2)
Alkaline Phosphatase: 136 U/L — ABNORMAL HIGH (ref 39–117)
BUN: 21 mg/dL (ref 6–23)
CO2: 28 mEq/L (ref 19–32)
Calcium: 10.2 mg/dL (ref 8.4–10.5)
Chloride: 101 mEq/L (ref 96–112)
Creatinine, Ser: 0.91 mg/dL (ref 0.40–1.20)
GFR: 61.48 mL/min (ref >60.00)
Glucose, Bld: 122 mg/dL — ABNORMAL HIGH (ref 70–99)
Potassium: 4.4 mEq/L (ref 3.5–5.1)
Sodium: 138 mEq/L (ref 135–145)
Total Bilirubin: 0.4 mg/dL (ref 0.2–1.2)
Total Protein: 8 g/dL (ref 6.0–8.3)

## 2020-04-20 LAB — CBC WITH DIFFERENTIAL/PLATELET
Basophils Absolute: 0.1 10*3/uL (ref 0.0–0.1)
Basophils Relative: 0.7 % (ref 0.0–3.0)
Eosinophils Absolute: 0.2 10*3/uL (ref 0.0–0.7)
Eosinophils Relative: 2.6 % (ref 0.0–5.0)
HCT: 43.1 % (ref 36.0–46.0)
Hemoglobin: 14.4 g/dL (ref 12.0–15.0)
Lymphocytes Relative: 30 % (ref 12.0–46.0)
Lymphs Abs: 2.5 10*3/uL (ref 0.7–4.0)
MCHC: 33.5 g/dL (ref 30.0–36.0)
MCV: 88.6 fl (ref 78.0–100.0)
Monocytes Absolute: 0.8 10*3/uL (ref 0.1–1.0)
Monocytes Relative: 9.7 % (ref 3.0–12.0)
Neutro Abs: 4.8 10*3/uL (ref 1.4–7.7)
Neutrophils Relative %: 57 % (ref 43.0–77.0)
Platelets: 290 10*3/uL (ref 150.0–400.0)
RBC: 4.86 Mil/uL (ref 3.87–5.11)
RDW: 13.6 % (ref 11.5–15.5)
WBC: 8.3 10*3/uL (ref 4.0–10.5)

## 2020-04-20 LAB — TSH: TSH: 6.87 u[IU]/mL — ABNORMAL HIGH (ref 0.35–4.50)

## 2020-04-20 MED ORDER — DOXYCYCLINE HYCLATE 100 MG PO TABS: 100.0000 mg | ORAL_TABLET | Freq: Two times a day (BID) | ORAL | 0 refills | Status: DC

## 2020-04-20 NOTE — Progress Notes (Signed)
Holly Obrien is a 69 y.o. female with the following history as recorded in EpicCare:  Patient Active Problem List   Diagnosis Date Noted  . Left renal mass 02/21/2017  . Dizziness 01/24/2017  . Kidney lesion, native, right 11/29/2016  . 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 History of cerebellar stroke 11/29/2016  . Coronary arteriosclerosis in native artery 11/29/2016  . Fatty liver disease, nonalcoholic 79/12/4095  . Lumbosacral radiculopathy 10/24/2016  . Routine general medical examination at a health care facility 01/31/2016  . Vitamin D deficiency 05/30/2010  . Myalgia and myositis 05/30/2010  . Asthma 09/20/2009  . Hypothyroidism 06/07/2009  . Hyperlipidemia 06/07/2009  . GERD 06/07/2009    Current Outpatient Medications  Medication Sig Dispense Refill  . albuterol (VENTOLIN HFA) 108 (90 Base) MCG/ACT inhaler USE 1-2 PUFFS EVERY 4 HRS AS NEEDED 18 Inhaler 2  . aspirin EC 81 MG tablet Take 81 mg by mouth daily.    . B Complex-C (SUPER B COMPLEX PO) Take 1,000 mcg by mouth.    . Bacillus Coagulans-Inulin (PROBIOTIC-PREBIOTIC PO) Take by mouth.    . Calcium Carbonate (CALCIUM 600 PO) Take by mouth. + mag/zink/d3    . Cholecalciferol (VITAMIN D PO) Take 5,000 Int'l Units by mouth.    . Coenzyme Q10 (CO Q 10 PO) Take 300 mg by mouth daily.    Marland Kitchen doxycycline (VIBRA-TABS) 100 MG tablet Take 1 tablet (100 mg total) by mouth 2 (two) times daily. 20 tablet 0  . Echinacea-Goldenseal (ECHINACEA COMB/GOLDEN SEAL PO) Take 900 mg by mouth.    . esomeprazole (NEXIUM) 20 MG capsule Take 20 mg by mouth daily at 12 noon.    . fluticasone (FLONASE) 50 MCG/ACT nasal spray 1 spray in each nostril twice a day as needed. Use the "crossover" technique as discussed 16 g 2  . Fluticasone-Salmeterol (ADVAIR HFA IN) Inhale into the lungs. 108 prn    . Krill Oil (OMEGA-3) 500 MG CAPS     . latanoprost (XALATAN) 0.005 % ophthalmic solution Place 1 drop into both eyes at bedtime.   3  . levothyroxine  (SYNTHROID) 50 MCG tablet TAKE 1 TABLET BY MOUTH DAILY BEFORE BREAKFAST 90 tablet 1  . MAGNESIUM PO Take 250 mg by mouth.    Marland Kitchen POTASSIUM PO Take 550 mg by mouth.    . simvastatin (ZOCOR) 20 MG tablet Take 1 tablet (20 mg total) by mouth daily at 6 PM. 90 tablet 3  . timolol (BETIMOL) 0.5 % ophthalmic solution Place 1 drop into both eyes daily.    Marland Kitchen UNABLE TO FIND otc sleep aid melatonin    . Zinc 50 MG TABS Take by mouth.     No current facility-administered medications for this visit.    Allergies: Ciloxan [ciprofloxacin hcl], Ciprofloxacin, Erythromycin, Penicillins, Cefuroxime axetil, and Sulfonamide derivatives  Past Medical History:  Diagnosis Date  . Arthritis   . Asthma    with URI's  . Cataract   . Chronic kidney disease    kidney mass  . Complication of anesthesia    "One time, I had a hard time waking up"  . Esophagitis    HH/ERD  . GERD (gastroesophageal reflux disease)   . Glaucoma   . Hyperlipidemia    NMR 2007  . Hypothyroidism   . Thyroid disease    hypothyroidism    Past Surgical History:  Procedure Laterality Date  . ABDOMINAL HYSTERECTOMY  04/30/88   Hysterectomy and bladder tact-- Dr Molli Hazard  .  CARPAL TUNNEL RELEASE  06/18/00   right wrist -- Dr Tamala Fothergill  . FINGER SURGERY  12/10/06   left index finger injury--Dr Theodis Sato  . OOPHORECTOMY  09/11/94, 01/14/96   1995-left ovary removed. 1997 Right ovary removed--Dr Molli Hazard  . PUBOVAGINAL SLING  1997   Dr Karsten Ro  . ROBOTIC ASSITED PARTIAL NEPHRECTOMY Right 02/20/2017   Procedure: XI ROBOTIC ASSITED PARTIAL NEPHRECTOMY;  Surgeon: Alexis Frock, MD;  Location: [facility];  Service: Urology;  Laterality: Right;  . TRIGGER FINGER RELEASE Right 01/2014  . TRIGGER FINGER RELEASE Left 09/2014    Family History  Problem Relation Age of Onset  . Arthritis Other   . Depression Other   . Thyroid disease Other        hyperthyroidism  . Stroke Other   . Heart disease Other        CABG  . Diabetes Other         hypoglycemia  . Osteoarthritis Mother   . Hyperlipidemia Mother   . Hypertension Mother   . Thyroid disease Mother   . Heart failure Father   . Cancer Father        mesothelioma  . Heart failure Brother   . Hypertension Brother   . Hypertension Sister     Social History   Tobacco Use  . Smoking status: Never Smoker  . Smokeless tobacco: Never Used  Substance Use Topics  . Alcohol use: No    Subjective:    I connected with Holly Obrien on 04/20/20 at 11:20 AM EDT by a video enabled telemedicine application and verified that I am speaking with the correct person using two identifiers.   I discussed the limitations of evaluation and management by telemedicine and the availability of in person appointments. The patient expressed understanding and agreed to proceed.  Provider in office/ patient is at home; provider and patient are only 2 people on video call.    Patient is concerned that her thyroid level is "not right." Asking to get her labs updated; she notes she is having dental issues and is suspicious that she has a sinus infection/ needs a root canal; historically, when she has needed dental issues, her thyroid level has needed to be adjusted; she does note she has had some occasional hot flashes as well recently.  Of note, she is scheduled to see her dentist tomorrow to follow-up on dental infection but notes that dental pain is problematic at this time.  Objective:  There were no vitals filed for this visit.  General: Well developed, well nourished, in no acute distress  Head: Normocephalic and atraumatic  Lungs: Respirations unlabored; Neurologic: Alert and oriented; speech intact; face symmetrical; moves all extremities well; CNII-XII intact without focal deficit   Assessment:  1. Other specified hypothyroidism   2. Acute sinusitis, recurrence not specified, unspecified location     Plan:  1. Orders updated as requested; she will go to [facility] location and  get these done; follow up to be determined; 2. Suspect secondary to current dental infection; keep planned appointment with dentist for tomorrow; will go ahead and start Doxycycline to help treat infection.  No follow-ups on file.  Orders Placed This Encounter  Procedures  . CBC with Differential/Platelet    Standing Status:   Future    Standing Expiration Date:   04/20/2021  . Comp Met (CMET)    Standing Status:   Future    Standing Expiration Date:   04/20/2021  . TSH  Standing Status:   Future    Standing Expiration Date:   04/20/2021    Requested Prescriptions   Signed Prescriptions Disp Refills  . doxycycline (VIBRA-TABS) 100 MG tablet 20 tablet 0    Sig: Take 1 tablet (100 mg total) by mouth 2 (two) times daily.

## 2020-04-22 ENCOUNTER — Other Ambulatory Visit: Payer: Self-pay | Admitting: Family

## 2020-04-22 DIAGNOSIS — E038 Other specified hypothyroidism: Secondary | ICD-10-CM

## 2020-04-22 MED ORDER — LEVOTHYROXINE SODIUM 75 MCG PO TABS: 75.0000 ug | ORAL_TABLET | Freq: Every day | ORAL | 0 refills | Status: DC

## 2020-05-05 ENCOUNTER — Other Ambulatory Visit: Payer: Self-pay | Admitting: Internal Medicine

## 2020-05-05 DIAGNOSIS — E782 Mixed hyperlipidemia: Secondary | ICD-10-CM

## 2020-06-22 ENCOUNTER — Other Ambulatory Visit: Payer: Self-pay | Admitting: Family

## 2020-06-22 ENCOUNTER — Telehealth (INDEPENDENT_AMBULATORY_CARE_PROVIDER_SITE_OTHER): Payer: Federal, State, Local not specified - PPO | Admitting: Family

## 2020-06-22 DIAGNOSIS — E038 Other specified hypothyroidism: Secondary | ICD-10-CM

## 2020-06-22 DIAGNOSIS — J019 Acute sinusitis, unspecified: Secondary | ICD-10-CM

## 2020-06-22 MED ORDER — DOXYCYCLINE HYCLATE 100 MG PO TABS: 100.0000 mg | ORAL_TABLET | Freq: Two times a day (BID) | ORAL | 0 refills | Status: DC

## 2020-06-22 MED ORDER — PREDNISONE 20 MG PO TABS: 20.0000 mg | ORAL_TABLET | Freq: Every day | ORAL | 0 refills | Status: DC

## 2020-06-22 NOTE — Progress Notes (Signed)
Holly Obrien is a 69 y.o. female with the following history as recorded in EpicCare:  Patient Active Problem List   Diagnosis Date Noted  . Left renal mass 02/21/2017  . Dizziness 01/24/2017  . Kidney lesion, native, right 11/29/2016  . 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 History of cerebellar stroke 11/29/2016  . Coronary arteriosclerosis in native artery 11/29/2016  . Fatty liver disease, nonalcoholic 60/45/4098  . Lumbosacral radiculopathy 10/24/2016  . Routine general medical examination at a health care facility 01/31/2016  . Vitamin D deficiency 05/30/2010  . Myalgia and myositis 05/30/2010  . Asthma 09/20/2009  . Hypothyroidism 06/07/2009  . Hyperlipidemia 06/07/2009  . GERD 06/07/2009    Current Outpatient Medications  Medication Sig Dispense Refill  . albuterol (VENTOLIN HFA) 108 (90 Base) MCG/ACT inhaler USE 1-2 PUFFS EVERY 4 HRS AS NEEDED 18 Inhaler 2  . aspirin EC 81 MG tablet Take 81 mg by mouth daily.    . B Complex-C (SUPER B COMPLEX PO) Take 1,000 mcg by mouth.    . Bacillus Coagulans-Inulin (PROBIOTIC-PREBIOTIC PO) Take by mouth.    . Calcium Carbonate (CALCIUM 600 PO) Take by mouth. + mag/zink/d3    . Cholecalciferol (VITAMIN D PO) Take 5,000 Int'l Units by mouth.    . Coenzyme Q10 (CO Q 10 PO) Take 300 mg by mouth daily.    Marland Kitchen doxycycline (VIBRA-TABS) 100 MG tablet Take 1 tablet (100 mg total) by mouth 2 (two) times daily. 20 tablet 0  . Echinacea-Goldenseal (ECHINACEA COMB/GOLDEN SEAL PO) Take 900 mg by mouth.    . fluticasone (FLONASE) 50 MCG/ACT nasal spray 1 spray in each nostril twice a day as needed. Use the "crossover" technique as discussed 16 g 2  . Fluticasone-Salmeterol (ADVAIR HFA IN) Inhale into the lungs. 108 prn    . Krill Oil (OMEGA-3) 500 MG CAPS     . latanoprost (XALATAN) 0.005 % ophthalmic solution Place 1 drop into both eyes at bedtime.   3  . levothyroxine (SYNTHROID) 75 MCG tablet Take 1 tablet (75 mcg total) by mouth daily before  breakfast. 90 tablet 0  . MAGNESIUM PO Take 250 mg by mouth.    Marland Kitchen POTASSIUM PO Take 550 mg by mouth.    . predniSONE (DELTASONE) 20 MG tablet Take 1 tablet (20 mg total) by mouth daily with breakfast. 5 tablet 0  . simvastatin (ZOCOR) 20 MG tablet TAKE 1 TABLET (20 MG TOTAL) BY MOUTH DAILY AT 6 PM. 90 tablet 1  . timolol (BETIMOL) 0.5 % ophthalmic solution Place 1 drop into both eyes daily.    Marland Kitchen UNABLE TO FIND otc sleep aid melatonin    . Zinc 50 MG TABS Take by mouth.     No current facility-administered medications for this visit.    Allergies: Ciloxan [ciprofloxacin hcl], Ciprofloxacin, Erythromycin, Penicillins, Cefuroxime axetil, and Sulfonamide derivatives  Past Medical History:  Diagnosis Date  . Arthritis   . Asthma    with URI's  . Cataract   . Chronic kidney disease    kidney mass  . Complication of anesthesia    "One time, I had a hard time waking up"  . Esophagitis    HH/ERD  . GERD (gastroesophageal reflux disease)   . Glaucoma   . Hyperlipidemia    NMR 2007  . Hypothyroidism   . Thyroid disease    hypothyroidism    Past Surgical History:  Procedure Laterality Date  . ABDOMINAL HYSTERECTOMY  04/30/88   Hysterectomy and bladder  tact-- Dr Molli Hazard  . CARPAL TUNNEL RELEASE  06/18/00   right wrist -- Dr Tamala Fothergill  . FINGER SURGERY  12/10/06   left index finger injury--Dr Theodis Sato  . OOPHORECTOMY  09/11/94, 01/14/96   1995-left ovary removed. 1997 Right ovary removed--Dr Molli Hazard  . PUBOVAGINAL SLING  1997   Dr Karsten Ro  . ROBOTIC ASSITED PARTIAL NEPHRECTOMY Right 02/20/2017   Procedure: XI ROBOTIC ASSITED PARTIAL NEPHRECTOMY;  Surgeon: Alexis Frock, MD;  Location: [facility];  Service: Urology;  Laterality: Right;  . TRIGGER FINGER RELEASE Right 01/2014  . TRIGGER FINGER RELEASE Left 09/2014    Family History  Problem Relation Age of Onset  . Arthritis Other   . Depression Other   . Thyroid disease Other        hyperthyroidism  . Stroke Other   . Heart  disease Other        CABG  . Diabetes Other        hypoglycemia  . Osteoarthritis Mother   . Hyperlipidemia Mother   . Hypertension Mother   . Thyroid disease Mother   . Heart failure Father   . Cancer Father        mesothelioma  . Heart failure Brother   . Hypertension Brother   . Hypertension Sister     Social History   Tobacco Use  . Smoking status: Never Smoker  . Smokeless tobacco: Never Used  Substance Use Topics  . Alcohol use: No    Subjective:    I connected with Holly Obrien on 06/22/20 at 12:00 PM EDT by a video enabled telemedicine application and verified that I am speaking with the correct person using two identifiers.   I discussed the limitations of evaluation and management by telemedicine and the availability of in person appointments. The patient expressed understanding and agreed to proceed. Provider in office/ patient is at home; provider and patient are only 2 people on video call.   1) Concern for sinus infection; symptoms x 2 days; using OTC Nyquil; prone to recurrent sinus infections; does feel that dental infection treated in May 2021 has cleared completely; no chest pain, no fever;   2) Needs to schedule follow-up regarding her thyroid level; Synthroid was adjusted at the end of May; is feeling better;     Objective:  There were no vitals filed for this visit.  General: Well developed, well nourished, in no acute distress  Head: Normocephalic and atraumatic  Lungs: Respirations unlabored; Neurologic: Alert and oriented; speech intact; face symmetrical; moves all extremities well; CNII-XII intact without focal deficit   Assessment:  1. Acute sinusitis, recurrence not specified, unspecified location   2. Other specified hypothyroidism     Plan:  1. Rx for Doxycycline 100 mg bid x 10 days, Prednisone 20 mg qd x 5 days; low suspicion for COVID but if symptoms persist, will need to consider testing and/or CXR; follow-up worse, no better. 2.  Patient will plan to go to [facility] to get her TSH re-checked in about 10 days; lab order updated; she is aware her PCP is going out on maternity leave soon and will plan to see her for CPE in November/ December;   No follow-ups on file.  No orders of the defined types were placed in this encounter.   Requested Prescriptions   Signed Prescriptions Disp Refills  . doxycycline (VIBRA-TABS) 100 MG tablet 20 tablet 0    Sig: Take 1 tablet (100 mg total) by mouth 2 (  two) times daily.  . predniSONE (DELTASONE) 20 MG tablet 5 tablet 0    Sig: Take 1 tablet (20 mg total) by mouth daily with breakfast.

## 2020-07-07 ENCOUNTER — Other Ambulatory Visit (INDEPENDENT_AMBULATORY_CARE_PROVIDER_SITE_OTHER): Payer: Federal, State, Local not specified - PPO

## 2020-07-07 DIAGNOSIS — E038 Other specified hypothyroidism: Secondary | ICD-10-CM | POA: Diagnosis not present

## 2020-07-07 LAB — TSH: TSH: 3.11 u[IU]/mL (ref 0.35–4.50)

## 2020-07-14 ENCOUNTER — Other Ambulatory Visit: Payer: Self-pay | Admitting: Family

## 2020-07-14 DIAGNOSIS — E038 Other specified hypothyroidism: Secondary | ICD-10-CM

## 2020-09-01 ENCOUNTER — Ambulatory Visit (INDEPENDENT_AMBULATORY_CARE_PROVIDER_SITE_OTHER): Payer: Federal, State, Local not specified - PPO | Admitting: *Deleted

## 2020-09-01 ENCOUNTER — Other Ambulatory Visit: Payer: Self-pay

## 2020-09-01 DIAGNOSIS — Z23 Encounter for immunization: Secondary | ICD-10-CM

## 2020-09-09 ENCOUNTER — Ambulatory Visit (HOSPITAL_COMMUNITY)
Admission: RE | Admit: 2020-09-09 | Discharge: 2020-09-09 | Disposition: A | Discharge status: Home / Self Care | Payer: Federal, State, Local not specified - PPO | Source: Ambulatory Visit | Attending: Urology | Admitting: Urology

## 2020-09-09 ENCOUNTER — Other Ambulatory Visit (HOSPITAL_COMMUNITY): Payer: Self-pay | Admitting: Urology

## 2020-09-09 ENCOUNTER — Other Ambulatory Visit: Payer: Self-pay

## 2020-09-09 DIAGNOSIS — C641 Malignant neoplasm of right kidney, except renal pelvis: Secondary | ICD-10-CM | POA: Diagnosis not present

## 2020-09-10 ENCOUNTER — Ambulatory Visit: Payer: Federal, State, Local not specified - PPO | Attending: Internal Medicine

## 2020-09-10 DIAGNOSIS — Z23 Encounter for immunization: Secondary | ICD-10-CM

## 2020-09-10 NOTE — Progress Notes (Signed)
   Covid-19 Vaccination Clinic  Name:  Holly Obrien    MRN: [id number] DOB: 01-18-1951  09/10/2020  Holly Obrien was observed post Covid-19 immunization for 15 minutes without incident. She was provided with Vaccine Information Sheet and instruction to access the V-Safe system.   Holly Obrien was instructed to call 911 with any severe reactions post vaccine: Marland Kitchen Difficulty breathing  . Swelling of face and throat  . A fast heartbeat  . A bad rash all over body  . Dizziness and weakness

## 2020-10-05 ENCOUNTER — Other Ambulatory Visit: Payer: Self-pay | Admitting: Internal Medicine

## 2020-10-05 DIAGNOSIS — E038 Other specified hypothyroidism: Secondary | ICD-10-CM

## 2020-10-12 ENCOUNTER — Telehealth: Payer: Self-pay | Admitting: Internal Medicine

## 2020-10-12 ENCOUNTER — Other Ambulatory Visit: Payer: Self-pay

## 2020-10-12 DIAGNOSIS — E038 Other specified hypothyroidism: Secondary | ICD-10-CM

## 2020-10-12 MED ORDER — LEVOTHYROXINE SODIUM 75 MCG PO TABS: 75.0000 ug | ORAL_TABLET | Freq: Every day | ORAL | 0 refills | Status: DC

## 2020-10-12 NOTE — Telephone Encounter (Signed)
levothyroxine (SYNTHROID) 75 MCG tablet  [id number] [place], [place] - [place] Phone:  [phone]  Fax:  [phone]     Requesting a refill

## 2020-10-25 ENCOUNTER — Other Ambulatory Visit: Payer: Self-pay | Admitting: Internal Medicine

## 2020-10-25 DIAGNOSIS — E782 Mixed hyperlipidemia: Secondary | ICD-10-CM

## 2021-01-05 ENCOUNTER — Other Ambulatory Visit: Payer: Self-pay | Admitting: Internal Medicine

## 2021-01-05 DIAGNOSIS — E038 Other specified hypothyroidism: Secondary | ICD-10-CM

## 2021-02-16 IMAGING — CR DG CHEST 2V
2 series · 2 of 2 positions shown · non-contrast
Comparison: 09/08/2019 and prior radiographs

CLINICAL DATA: 69-year-old female with history of renal cell
carcinoma.

EXAM:
CHEST - 2 VIEW

[w chest pa]
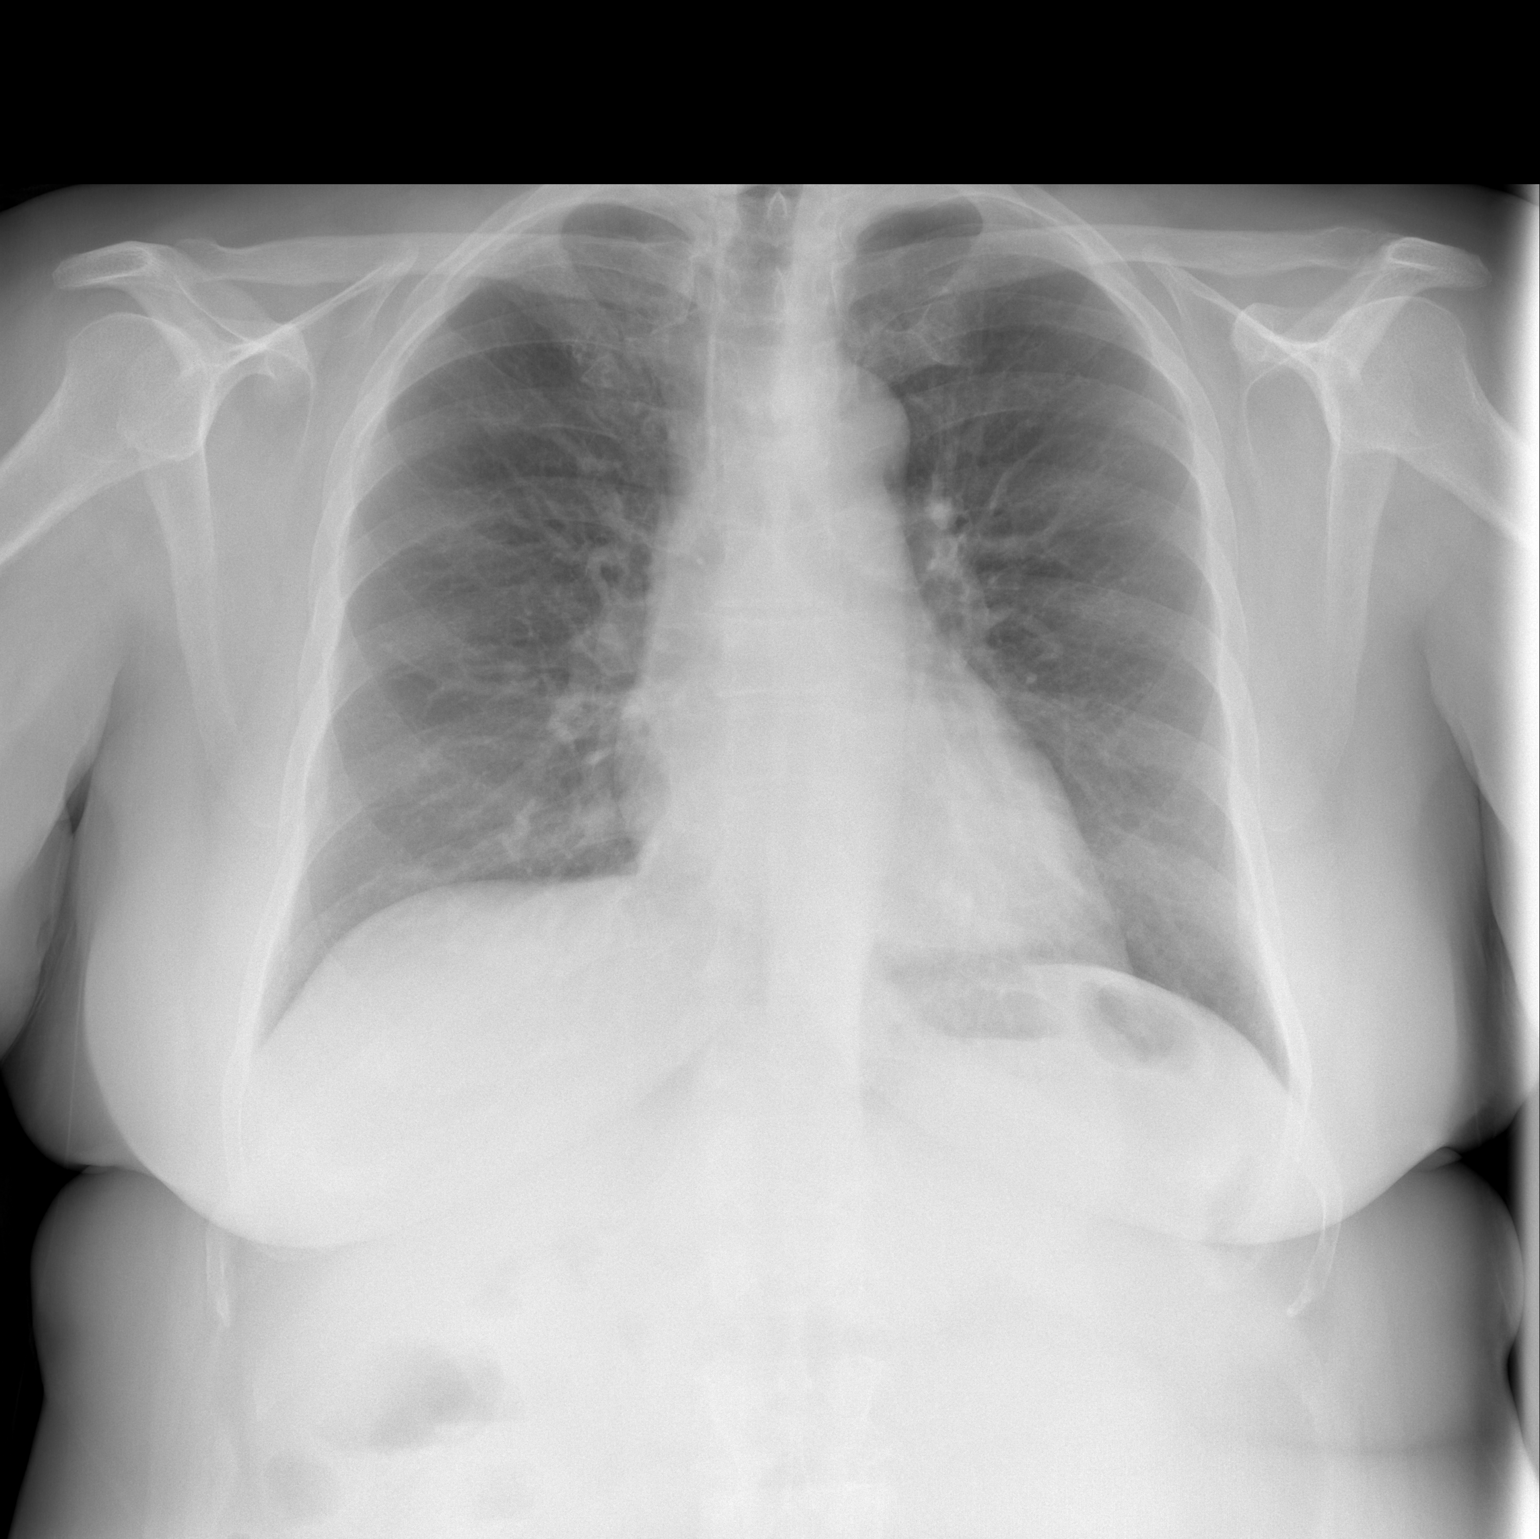

[w chest lat]
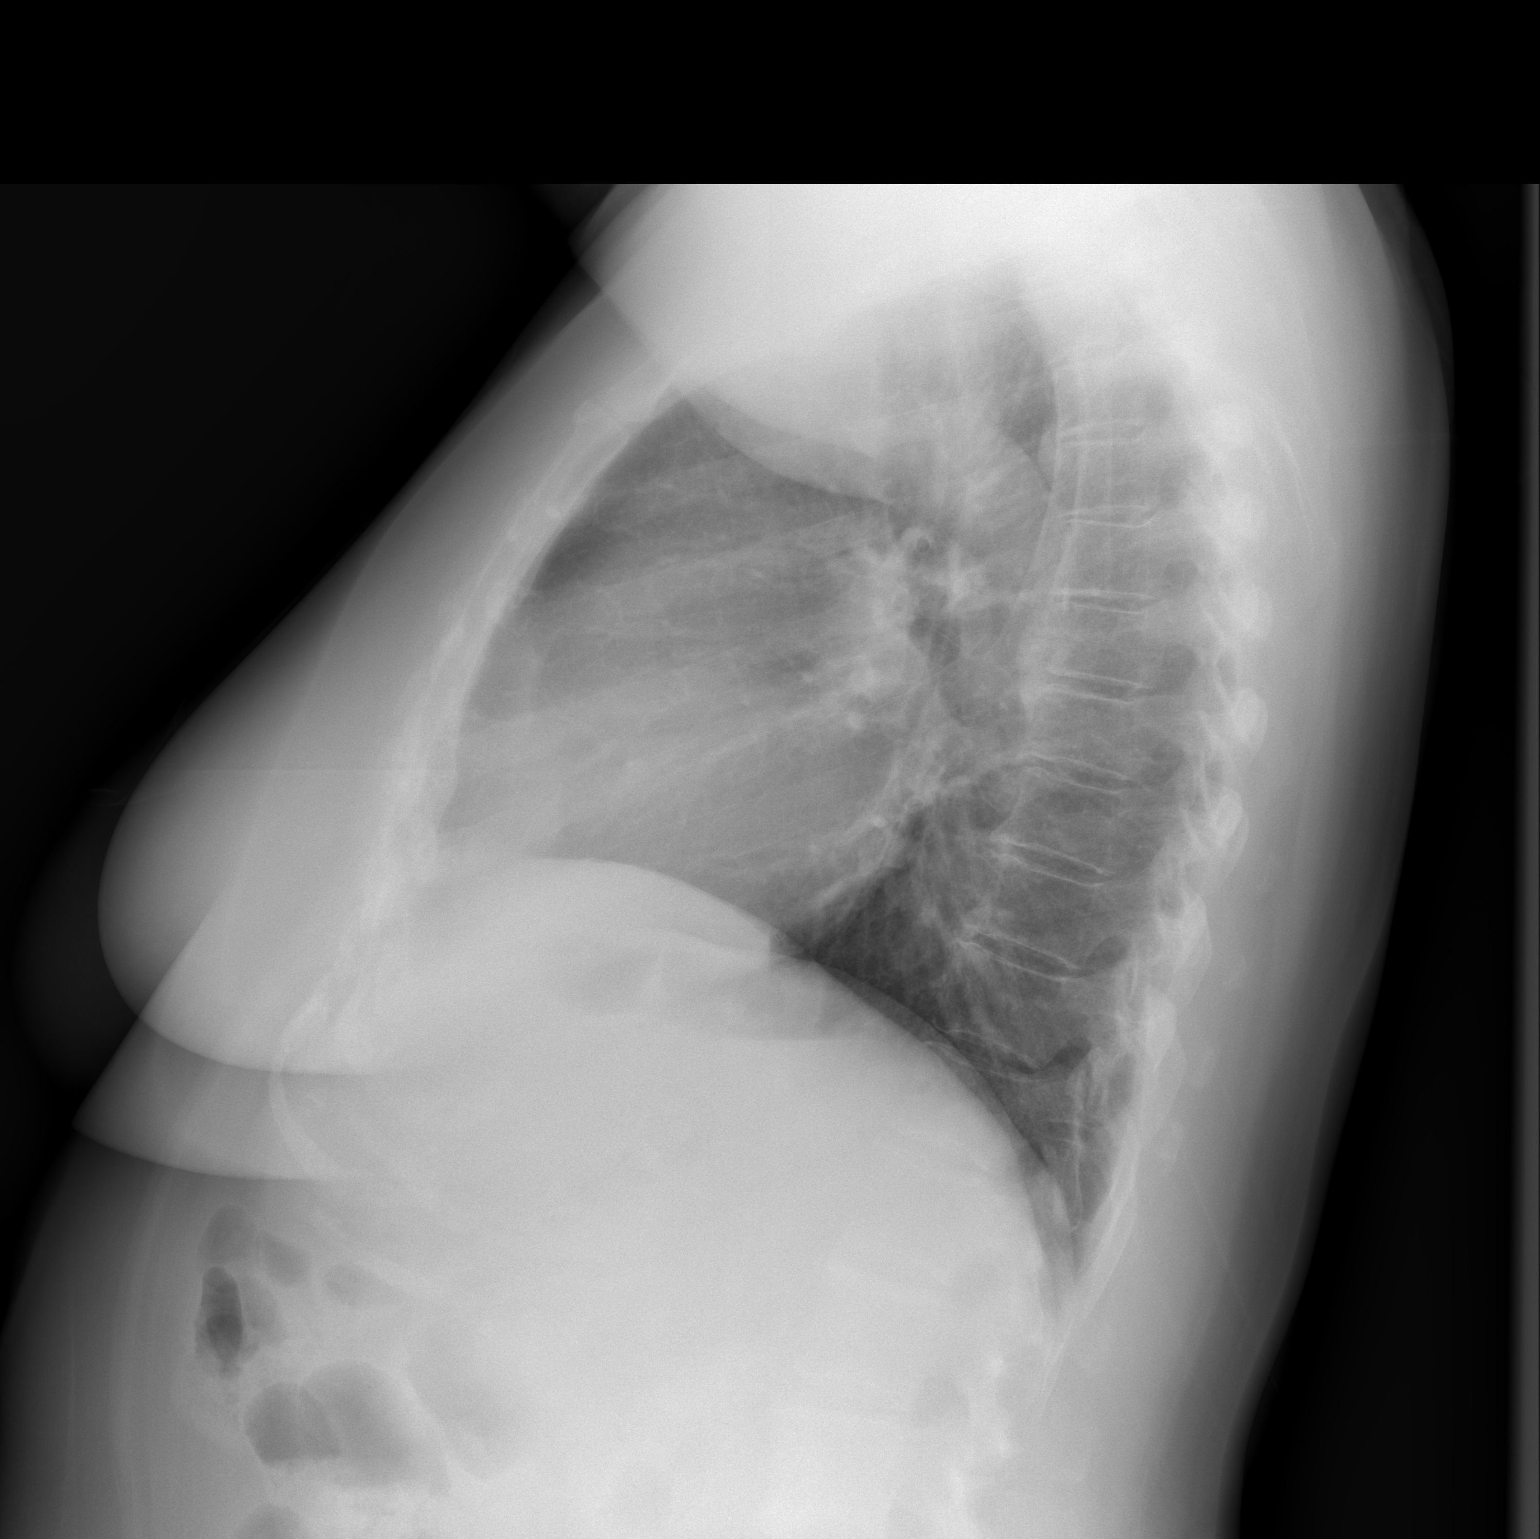

[2 of 2 positions shown; findings below may reference images not displayed]

FINDINGS: The cardiomediastinal silhouette is unremarkable.

There is no evidence of focal airspace disease, pulmonary edema,
suspicious pulmonary nodule/mass, pleural effusion, or pneumothorax.

No acute bony abnormalities are identified.
IMPRESSION: No active cardiopulmonary disease.

## 2021-03-29 DIAGNOSIS — M65341 Trigger finger, right ring finger: Secondary | ICD-10-CM | POA: Insufficient documentation

## 2021-03-29 DIAGNOSIS — Z4789 Encounter for other orthopedic aftercare: Secondary | ICD-10-CM | POA: Insufficient documentation

## 2021-03-31 ENCOUNTER — Other Ambulatory Visit: Payer: Self-pay | Admitting: Internal Medicine

## 2021-03-31 DIAGNOSIS — E038 Other specified hypothyroidism: Secondary | ICD-10-CM

## 2021-04-06 ENCOUNTER — Ambulatory Visit (INDEPENDENT_AMBULATORY_CARE_PROVIDER_SITE_OTHER): Payer: Federal, State, Local not specified - PPO | Admitting: Internal Medicine

## 2021-04-06 ENCOUNTER — Encounter: Payer: Self-pay | Admitting: Internal Medicine

## 2021-04-06 ENCOUNTER — Other Ambulatory Visit: Payer: Self-pay

## 2021-04-06 VITALS — BP 156/82 | HR 79 | Temp 98.5°F | Ht 63.0 in | Wt 219.0 lb

## 2021-04-06 DIAGNOSIS — R42 Dizziness and giddiness: Secondary | ICD-10-CM | POA: Diagnosis not present

## 2021-04-06 DIAGNOSIS — R7301 Impaired fasting glucose: Secondary | ICD-10-CM | POA: Diagnosis not present

## 2021-04-06 DIAGNOSIS — E782 Mixed hyperlipidemia: Secondary | ICD-10-CM

## 2021-04-06 DIAGNOSIS — Z8673 Personal history of transient ischemic attack (TIA), and cerebral infarction without residual deficits: Secondary | ICD-10-CM

## 2021-04-06 DIAGNOSIS — Z1211 Encounter for screening for malignant neoplasm of colon: Secondary | ICD-10-CM

## 2021-04-06 DIAGNOSIS — Z0000 Encounter for general adult medical examination without abnormal findings: Secondary | ICD-10-CM | POA: Diagnosis not present

## 2021-04-06 DIAGNOSIS — J4520 Mild intermittent asthma, uncomplicated: Secondary | ICD-10-CM

## 2021-04-06 DIAGNOSIS — E038 Other specified hypothyroidism: Secondary | ICD-10-CM

## 2021-04-06 DIAGNOSIS — Z1231 Encounter for screening mammogram for malignant neoplasm of breast: Secondary | ICD-10-CM

## 2021-04-06 LAB — CBC
HCT: 42.3 % (ref 36.0–46.0)
Hemoglobin: 14.3 g/dL (ref 12.0–15.0)
MCHC: 33.7 g/dL (ref 30.0–36.0)
MCV: 89.6 fl (ref 78.0–100.0)
Platelets: 260 10*3/uL (ref 150.0–400.0)
RBC: 4.72 Mil/uL (ref 3.87–5.11)
RDW: 13.6 % (ref 11.5–15.5)
WBC: 7.9 10*3/uL (ref 4.0–10.5)

## 2021-04-06 LAB — COMPREHENSIVE METABOLIC PANEL
ALT: 27 U/L (ref 0–35)
AST: 26 U/L (ref 0–37)
Albumin: 4.3 g/dL (ref 3.5–5.2)
Alkaline Phosphatase: 127 U/L — ABNORMAL HIGH (ref 39–117)
BUN: 23 mg/dL (ref 6–23)
CO2: 30 mEq/L (ref 19–32)
Calcium: 9.8 mg/dL (ref 8.4–10.5)
Chloride: 103 mEq/L (ref 96–112)
Creatinine, Ser: 1.01 mg/dL (ref 0.40–1.20)
GFR: 57.03 mL/min — ABNORMAL LOW (ref >60.00)
Glucose, Bld: 94 mg/dL (ref 70–99)
Potassium: 4.9 mEq/L (ref 3.5–5.1)
Sodium: 140 mEq/L (ref 135–145)
Total Bilirubin: 0.5 mg/dL (ref 0.2–1.2)
Total Protein: 7.7 g/dL (ref 6.0–8.3)

## 2021-04-06 LAB — HEMOGLOBIN A1C: Hgb A1c MFr Bld: 6.1 % (ref 4.6–6.5)

## 2021-04-06 LAB — T4, FREE: Free T4: 0.94 ng/dL (ref 0.60–1.60)

## 2021-04-06 LAB — LIPID PANEL
Cholesterol: 138 mg/dL (ref 0–200)
HDL: 40.5 mg/dL (ref >39.00)
LDL Cholesterol: 63 mg/dL (ref 0–99)
NonHDL: 97.26
Total CHOL/HDL Ratio: 3
Triglycerides: 171 mg/dL — ABNORMAL HIGH (ref 0.0–149.0)
VLDL: 34.2 mg/dL (ref 0.0–40.0)

## 2021-04-06 LAB — TSH: TSH: 4.05 u[IU]/mL (ref 0.35–4.50)

## 2021-04-06 NOTE — Progress Notes (Addendum)
Subjective:   Patient ID: Holly Obrien, female    DOB: [birth year], 70 y.o.   MRN: [id number]  HPI Here for medicare wellness and follow up routine medical problems, no new complaints. Please see A/P for status and treatment of chronic medical problems.   Diet: heart healthy  Physical activity: sedentary Depression/mood screen: negative Hearing: intact to whispered voice, mild loss bilaterally Visual acuity: grossly normal with lens, performs annual eye exam  ADLs: capable Fall risk: none Home safety: good Cognitive evaluation: intact to orientation, naming, recall and repetition EOL planning: adv directives discussed  [place] Visit from 04/06/2021 in [place] at [facility] Total Score 0      I have personally reviewed and have noted 1. The patient's medical and social history - reviewed today no changes 2. Their use of alcohol, tobacco or illicit drugs 3. Their current medications and supplements 4. The patient's functional ability including ADL's, fall risks, home safety risks and hearing or visual impairment. 5. Diet and physical activities 6. Evidence for depression or mood disorders 7. Care team reviewed and updated 8.  The patient is not on an opioid pain medication.  Patient Care Team: Hoyt Koch, MD as PCP - General (Internal Medicine) Alexis Frock, MD as Consulting Physician (Urology) Past Medical History:  Diagnosis Date  . Arthritis   . Asthma    with URI's  . Cataract   . Chronic kidney disease    kidney mass  . Complication of anesthesia    "One time, I had a hard time waking up"  . Esophagitis    HH/ERD  . GERD (gastroesophageal reflux disease)   . Glaucoma   . Hyperlipidemia    NMR 2007  . Hypothyroidism   . Thyroid disease    hypothyroidism   Past Surgical History:  Procedure Laterality Date  . ABDOMINAL HYSTERECTOMY  04/30/88   Hysterectomy and bladder tact-- Dr Molli Hazard  . CARPAL TUNNEL  RELEASE  06/18/00   right wrist -- Dr Tamala Fothergill  . FINGER SURGERY  12/10/06   left index finger injury--Dr Theodis Sato  . OOPHORECTOMY  09/11/94, 01/14/96   1995-left ovary removed. 1997 Right ovary removed--Dr Molli Hazard  . PUBOVAGINAL SLING  1997   Dr Karsten Ro  . ROBOTIC ASSITED PARTIAL NEPHRECTOMY Right 02/20/2017   Procedure: XI ROBOTIC ASSITED PARTIAL NEPHRECTOMY;  Surgeon: Alexis Frock, MD;  Location: [facility];  Service: Urology;  Laterality: Right;  . TRIGGER FINGER RELEASE Right 01/2014  . TRIGGER FINGER RELEASE Left 09/2014   Family History  Problem Relation Age of Onset  . Arthritis Other   . Depression Other   . Thyroid disease Other        hyperthyroidism  . Stroke Other   . Heart disease Other        CABG  . Diabetes Other        hypoglycemia  . Osteoarthritis Mother   . Hyperlipidemia Mother   . Hypertension Mother   . Thyroid disease Mother   . Heart failure Father   . Cancer Father        mesothelioma  . Heart failure Brother   . Hypertension Brother   . Hypertension Sister    Review of Systems  Constitutional: Negative.   HENT: Negative.   Eyes: Negative.   Respiratory: Negative for cough, chest tightness and shortness of breath.   Cardiovascular: Negative for chest pain, palpitations and leg swelling.  Gastrointestinal: Negative for abdominal distention,  abdominal pain, constipation, diarrhea, nausea and vomiting.  Musculoskeletal: Negative.   Skin: Negative.   Neurological: Negative.   Psychiatric/Behavioral: Negative.     Objective:  Physical Exam Constitutional:      Appearance: She is well-developed.  HENT:     Head: Normocephalic and atraumatic.  Cardiovascular:     Rate and Rhythm: Normal rate and regular rhythm.  Pulmonary:     Effort: Pulmonary effort is normal. No respiratory distress.     Breath sounds: Normal breath sounds. No wheezing or rales.  Abdominal:     General: Bowel sounds are normal. There is no distension.      Palpations: Abdomen is soft.     Tenderness: There is no abdominal tenderness. There is no rebound.  Musculoskeletal:     Cervical back: Normal range of motion.  Skin:    General: Skin is warm and dry.  Neurological:     Mental Status: She is alert and oriented to person, place, and time.     Coordination: Coordination normal.     Vitals:   04/06/21 1108 04/06/21 1149  BP: (!) 152/100 (!) 156/82  Pulse: 79   Temp: 98.5 F (36.9 C)   TempSrc: Oral   Weight: 219 lb (99.3 kg)   Height: 5\' 3"  (1.6 m)    This visit occurred during the SARS-CoV-2 public health emergency.  Safety protocols were in place, including screening questions prior to the visit, additional usage of staff PPE, and extensive cleaning of exam room while observing appropriate contact time as indicated for disinfecting solutions.   Assessment & Plan:

## 2021-04-06 NOTE — Patient Instructions (Addendum)
We will get the mammogam and the cologuard. We will check the labs today.   Health Maintenance, Female Adopting a healthy lifestyle and getting preventive care are important in promoting health and wellness. Ask your health care provider about:  The right schedule for you to have regular tests and exams.  Things you can do on your own to prevent diseases and keep yourself healthy. What should I know about diet, weight, and exercise? Eat a healthy diet  Eat a diet that includes plenty of vegetables, fruits, low-fat dairy products, and lean protein.  Do not eat a lot of foods that are high in solid fats, added sugars, or sodium.   Maintain a healthy weight Body mass index (BMI) is used to identify weight problems. It estimates body fat based on height and weight. Your health care provider can help determine your BMI and help you achieve or maintain a healthy weight. Get regular exercise Get regular exercise. This is one of the most important things you can do for your health. Most adults should:  Exercise for at least 150 minutes each week. The exercise should increase your heart rate and make you sweat (moderate-intensity exercise).  Do strengthening exercises at least twice a week. This is in addition to the moderate-intensity exercise.  Spend less time sitting. Even light physical activity can be beneficial. Watch cholesterol and blood lipids Have your blood tested for lipids and cholesterol at 69 years of age, then have this test every 5 years. Have your cholesterol levels checked more often if:  Your lipid or cholesterol levels are high.  You are older than 69 years of age.  You are at high risk for heart disease. What should I know about cancer screening? Depending on your health history and family history, you may need to have cancer screening at various ages. This may include screening for:  Breast cancer.  Cervical cancer.  Colorectal cancer.  Skin cancer.  Lung  cancer. What should I know about heart disease, diabetes, and high blood pressure? Blood pressure and heart disease  High blood pressure causes heart disease and increases the risk of stroke. This is more likely to develop in people who have high blood pressure readings, are of African descent, or are overweight.  Have your blood pressure checked: ? Every 3-5 years if you are 28-95 years of age. ? Every year if you are 53 years old or older. Diabetes Have regular diabetes screenings. This checks your fasting blood sugar level. Have the screening done:  Once every three years after age 58 if you are at a normal weight and have a low risk for diabetes.  More often and at a younger age if you are overweight or have a high risk for diabetes. What should I know about preventing infection? Hepatitis B If you have a higher risk for hepatitis B, you should be screened for this virus. Talk with your health care provider to find out if you are at risk for hepatitis B infection. Hepatitis C Testing is recommended for:  Everyone born from [birth year] through 1964.  Anyone with known risk factors for hepatitis C. Sexually transmitted infections (STIs)  Get screened for STIs, including gonorrhea and chlamydia, if: ? You are sexually active and are younger than 69 years of age. ? You are older than 69 years of age and your health care provider tells you that you are at risk for this type of infection. ? Your sexual activity has changed since you were last  screened, and you are at increased risk for chlamydia or gonorrhea. Ask your health care provider if you are at risk.  Ask your health care provider about whether you are at high risk for HIV. Your health care provider may recommend a prescription medicine to help prevent HIV infection. If you choose to take medicine to prevent HIV, you should first get tested for HIV. You should then be tested every 3 months for as long as you are taking the  medicine. Pregnancy  If you are about to stop having your period (premenopausal) and you may become pregnant, seek counseling before you get pregnant.  Take 400 to 800 micrograms (mcg) of folic acid every day if you become pregnant.  Ask for birth control (contraception) if you want to prevent pregnancy. Osteoporosis and menopause Osteoporosis is a disease in which the bones lose minerals and strength with aging. This can result in bone fractures. If you are 74 years old or older, or if you are at risk for osteoporosis and fractures, ask your health care provider if you should:  Be screened for bone loss.  Take a calcium or vitamin D supplement to lower your risk of fractures.  Be given hormone replacement therapy (HRT) to treat symptoms of menopause. Follow these instructions at home: Lifestyle  Do not use any products that contain nicotine or tobacco, such as cigarettes, e-cigarettes, and chewing tobacco. If you need help quitting, ask your health care provider.  Do not use street drugs.  Do not share needles.  Ask your health care provider for help if you need support or information about quitting drugs. Alcohol use  Do not drink alcohol if: ? Your health care provider tells you not to drink. ? You are pregnant, may be pregnant, or are planning to become pregnant.  If you drink alcohol: ? Limit how much you use to 0-1 drink a day. ? Limit intake if you are breastfeeding.  Be aware of how much alcohol is in your drink. In the [place] one drink equals one 12 oz bottle of beer (355 mL), one 5 oz glass of wine (148 mL), or one 1 oz glass of hard liquor (44 mL). General instructions  Schedule regular health, dental, and eye exams.  Stay current with your vaccines.  Tell your health care provider if: ? You often feel depressed. ? You have ever been abused or do not feel safe at home. Summary  Adopting a healthy lifestyle and getting preventive care are important in  promoting health and wellness.  Follow your health care provider's instructions about healthy diet, exercising, and getting tested or screened for diseases.  Follow your health care provider's instructions on monitoring your cholesterol and blood pressure. This information is not intended to replace advice given to you by your health care provider. Make sure you discuss any questions you have with your health care provider. Document Revised: 11/05/2018 Document Reviewed: 11/05/2018 Elsevier Patient Education  2021 [employer].

## 2021-04-07 ENCOUNTER — Encounter: Payer: Self-pay | Admitting: Internal Medicine

## 2021-04-07 NOTE — Assessment & Plan Note (Signed)
Checking TSH and free T4 and adjust synthroid as needed.

## 2021-04-07 NOTE — Assessment & Plan Note (Signed)
Could be related to prior stroke.

## 2021-04-07 NOTE — Assessment & Plan Note (Signed)
BP elevated and asked her to monitor at home and call back with readings. Her goal BP 120/80 given prior stroke.

## 2021-04-07 NOTE — Assessment & Plan Note (Addendum)
Flu shot yearly. Covid-19 3 shots counseled about 4th. Pneumonia complete. Shingrix counseled to get at pharmacy. Tetanus due 2028. Cologuard ordered as due this year. Mammogram due ordered, pap smear aged out and dexa declines further. Counseled about sun safety and mole surveillance. Counseled about the dangers of distracted driving. Given 10 year screening recommendations.

## 2021-04-07 NOTE — Assessment & Plan Note (Signed)
Checking lipid panel and adjust simvastatin as needed.  

## 2021-04-07 NOTE — Assessment & Plan Note (Signed)
No flare, albuterol prn.

## 2021-04-10 ENCOUNTER — Other Ambulatory Visit: Payer: Self-pay | Admitting: Internal Medicine

## 2021-04-10 DIAGNOSIS — E038 Other specified hypothyroidism: Secondary | ICD-10-CM

## 2021-04-10 MED ORDER — LEVOTHYROXINE SODIUM 75 MCG PO TABS: 75.0000 ug | ORAL_TABLET | Freq: Every day | ORAL | 1 refills | Status: DC

## 2021-04-14 NOTE — Addendum Note (Signed)
Addended by: Pricilla Holm A on: 04/14/2021 07:50 AM   Modules accepted: Level of Service

## 2021-04-20 ENCOUNTER — Other Ambulatory Visit: Payer: Self-pay | Admitting: Internal Medicine

## 2021-04-20 DIAGNOSIS — E782 Mixed hyperlipidemia: Secondary | ICD-10-CM

## 2021-04-27 ENCOUNTER — Ambulatory Visit
Admission: RE | Admit: 2021-04-27 | Discharge: 2021-04-27 | Disposition: A | Discharge status: Home / Self Care | Payer: Federal, State, Local not specified - PPO | Source: Ambulatory Visit | Attending: Internal Medicine | Admitting: Internal Medicine

## 2021-04-27 ENCOUNTER — Other Ambulatory Visit: Payer: Self-pay

## 2021-04-27 DIAGNOSIS — Z1231 Encounter for screening mammogram for malignant neoplasm of breast: Secondary | ICD-10-CM

## 2021-04-29 ENCOUNTER — Encounter: Payer: Self-pay | Admitting: Internal Medicine

## 2021-06-01 ENCOUNTER — Encounter: Payer: Self-pay | Admitting: Internal Medicine

## 2021-06-01 ENCOUNTER — Ambulatory Visit: Payer: Federal, State, Local not specified - PPO | Admitting: Internal Medicine

## 2021-06-01 ENCOUNTER — Other Ambulatory Visit: Payer: Self-pay

## 2021-06-01 DIAGNOSIS — M79604 Pain in right leg: Secondary | ICD-10-CM | POA: Diagnosis not present

## 2021-06-01 NOTE — Assessment & Plan Note (Signed)
Likely normal healing tick bite. Could be another month until healed. Advised to monitor for mounding in the area which could signify new skin lesion at risk for skin cancer. If she notices this return. Otherwise supportive care and ice as needed for pain.

## 2021-06-01 NOTE — Patient Instructions (Signed)
You should be fine the tick bite will heal.

## 2021-06-01 NOTE — Progress Notes (Signed)
   Subjective:   Patient ID: Holly Obrien, female    DOB: June 15, 1951, 70 y.o.   MRN: [id number]  HPI The patient is a 70 YO female coming in for concerns about tick bites within the last month. Tick was on overnight and then removed. No red rash appeared around it. She still has wound and is unsure if this is normal.   Review of Systems  Constitutional: Negative.   HENT: Negative.    Eyes: Negative.   Respiratory:  Negative for cough, chest tightness and shortness of breath.   Cardiovascular:  Negative for chest pain, palpitations and leg swelling.  Gastrointestinal:  Negative for abdominal distention, abdominal pain, constipation, diarrhea, nausea and vomiting.  Musculoskeletal: Negative.   Skin:  Positive for wound.  Neurological: Negative.   Psychiatric/Behavioral: Negative.     Objective:  Physical Exam Constitutional:      Appearance: She is well-developed.  HENT:     Head: Normocephalic and atraumatic.  Cardiovascular:     Rate and Rhythm: Normal rate and regular rhythm.  Pulmonary:     Effort: Pulmonary effort is normal. No respiratory distress.     Breath sounds: Normal breath sounds. No wheezing or rales.  Abdominal:     General: Bowel sounds are normal. There is no distension.     Palpations: Abdomen is soft.     Tenderness: There is no abdominal tenderness. There is no rebound.  Musculoskeletal:     Cervical back: Normal range of motion.  Skin:    General: Skin is warm and dry.     Comments: Wound on the right medial calf region consistent with prior tick bite without signs of infection, abscess, retention of tick parts  Neurological:     Mental Status: She is alert and oriented to person, place, and time.     Coordination: Coordination normal.    Vitals:   06/01/21 1035  BP: 124/64  Pulse: 65  Resp: 18  Temp: 98.1 F (36.7 C)  TempSrc: Oral  SpO2: 95%  Weight: 215 lb 9.6 oz (97.8 kg)  Height: 5\' 3"  (1.6 m)    This visit occurred during the  SARS-CoV-2 public health emergency.  Safety protocols were in place, including screening questions prior to the visit, additional usage of staff PPE, and extensive cleaning of exam room while observing appropriate contact time as indicated for disinfecting solutions.   Assessment & Plan:

## 2021-06-19 DIAGNOSIS — M25552 Pain in left hip: Secondary | ICD-10-CM | POA: Insufficient documentation

## 2021-07-10 ENCOUNTER — Telehealth: Payer: Self-pay | Admitting: Internal Medicine

## 2021-07-10 NOTE — Telephone Encounter (Signed)
Symptoms started evening of 8/10 Tested positive on 8.10.22  Sinus, cough, congestion  On Thursday 8.11.22 started taking Robitussin and mucinex, starting to feel better, should she continue to take these two medicines?  And how long should you stay in?  Patient is okay with receiving a my chart follow-up

## 2021-07-10 NOTE — Telephone Encounter (Signed)
Okay to take medications for symptoms only as needed. CDC guidelines 5 day quarantine from symptom onset then following 5 days leave house only with mask on (do not eat with those you do not live with).

## 2021-07-10 NOTE — Telephone Encounter (Signed)
See below

## 2021-07-12 NOTE — Telephone Encounter (Signed)
   Patient made aware of Dr Charlynne Cousins response

## 2021-08-04 ENCOUNTER — Telehealth: Payer: Self-pay

## 2021-08-04 DIAGNOSIS — J310 Chronic rhinitis: Secondary | ICD-10-CM

## 2021-08-07 MED ORDER — FLUTICASONE PROPIONATE 50 MCG/ACT NA SUSP: NASAL | 2 refills | Status: DC

## 2021-08-07 NOTE — Telephone Encounter (Signed)
Refill has been sent to the patient's pharmacy.  

## 2021-08-07 NOTE — Addendum Note (Signed)
Addended by: Thomes Cake on: 08/07/2021 11:35 AM   Modules accepted: Orders

## 2021-08-08 ENCOUNTER — Telehealth: Payer: Self-pay | Admitting: Internal Medicine

## 2021-08-08 DIAGNOSIS — J4521 Mild intermittent asthma with (acute) exacerbation: Secondary | ICD-10-CM

## 2021-08-08 MED ORDER — ALBUTEROL SULFATE HFA 108 (90 BASE) MCG/ACT IN AERS
INHALATION_SPRAY | RESPIRATORY_TRACT | 2 refills | Status: DC
Start: 2021-08-08 — End: 2023-01-08

## 2021-08-08 NOTE — Telephone Encounter (Signed)
1.Medication Requested: albuterol (VENTOLIN HFA) 108 (90 Base) MCG/ACT inhaler  2. Pharm[facility] ([facility] [id number] [place] [place]  [phone] Fax:  [phone]     3. On Med List:  Y  4. Last Visit with PCP: 7.7.22  5. Next visit date with PCP: 9.16.22   Agent: Please be advised that RX refills may take up to 3 business days. We ask that you follow-up with your pharmacy.

## 2021-08-08 NOTE — Telephone Encounter (Signed)
Refill was sent to the pharmacy

## 2021-08-11 ENCOUNTER — Other Ambulatory Visit: Payer: Self-pay

## 2021-08-11 ENCOUNTER — Ambulatory Visit: Payer: Federal, State, Local not specified - PPO | Admitting: Internal Medicine

## 2021-08-11 ENCOUNTER — Encounter: Payer: Self-pay | Admitting: Internal Medicine

## 2021-08-11 VITALS — BP 128/80 | HR 75 | Temp 98.1°F | Resp 18 | Ht 63.0 in | Wt 216.2 lb

## 2021-08-11 DIAGNOSIS — H9201 Otalgia, right ear: Secondary | ICD-10-CM

## 2021-08-11 DIAGNOSIS — R42 Dizziness and giddiness: Secondary | ICD-10-CM

## 2021-08-11 DIAGNOSIS — J4521 Mild intermittent asthma with (acute) exacerbation: Secondary | ICD-10-CM

## 2021-08-11 MED ORDER — PREDNISONE 20 MG PO TABS: 40.0000 mg | ORAL_TABLET | Freq: Every day | ORAL | 0 refills | Status: AC

## 2021-08-11 NOTE — Patient Instructions (Addendum)
We have sent in prednisone to take 2 pills daily for 5 days. I have sent in 7 days worth of medicine so if you are not 100% better take the whole 7 days.  We will get you in with the ear nose and throat doctor.

## 2021-08-11 NOTE — Progress Notes (Signed)
   Subjective:   Patient ID: Holly Obrien, female    DOB: March 15, 1951, 69 y.o.   MRN: [id number]  HPI The patient is a 70 YO female coming in for ongoing cough and sinus problems since having covid-19 in August. Has asthma and typically does not need albuterol using often lately. Still taking advair and flonase.   Review of Systems  Constitutional: Negative.   HENT:  Positive for congestion, postnasal drip and rhinorrhea.   Eyes: Negative.   Respiratory:  Positive for cough and shortness of breath. Negative for chest tightness.   Cardiovascular:  Negative for chest pain, palpitations and leg swelling.  Gastrointestinal:  Negative for abdominal distention, abdominal pain, constipation, diarrhea, nausea and vomiting.  Musculoskeletal: Negative.   Skin: Negative.   Neurological: Negative.   Psychiatric/Behavioral: Negative.     Objective:  Physical Exam Constitutional:      Appearance: She is well-developed.  HENT:     Head: Normocephalic and atraumatic.     Nose: Congestion and rhinorrhea present.  Cardiovascular:     Rate and Rhythm: Normal rate and regular rhythm.  Pulmonary:     Effort: Pulmonary effort is normal. No respiratory distress.     Breath sounds: Wheezing present. No rales.  Abdominal:     General: Bowel sounds are normal. There is no distension.     Palpations: Abdomen is soft.     Tenderness: There is no abdominal tenderness. There is no rebound.  Musculoskeletal:     Cervical back: Normal range of motion.  Skin:    General: Skin is warm and dry.  Neurological:     Mental Status: She is alert and oriented to person, place, and time.     Coordination: Coordination normal.    Vitals:   08/11/21 1014  BP: 128/80  Pulse: 75  Resp: 18  Temp: 98.1 F (36.7 C)  TempSrc: Oral  SpO2: 96%  Weight: 216 lb 3.2 oz (98.1 kg)  Height: '5\' 3"'$  (1.6 m)    This visit occurred during the SARS-CoV-2 public health emergency.  Safety protocols were in place, including  screening questions prior to the visit, additional usage of staff PPE, and extensive cleaning of exam room while observing appropriate contact time as indicated for disinfecting solutions.   Assessment & Plan:  Flu shot given at visit

## 2021-08-11 NOTE — Assessment & Plan Note (Signed)
With flare today. Given prednisone dose. If no improvement will treat with antibiotic. Continue advair scheduled and albuterol prn. Continue flonase.

## 2021-08-25 ENCOUNTER — Other Ambulatory Visit: Payer: Self-pay | Admitting: Internal Medicine

## 2021-08-25 DIAGNOSIS — E782 Mixed hyperlipidemia: Secondary | ICD-10-CM

## 2021-09-28 ENCOUNTER — Encounter: Payer: Self-pay | Admitting: Family Medicine

## 2021-09-28 ENCOUNTER — Telehealth (INDEPENDENT_AMBULATORY_CARE_PROVIDER_SITE_OTHER): Payer: Federal, State, Local not specified - PPO | Admitting: Family Medicine

## 2021-09-28 DIAGNOSIS — R059 Cough, unspecified: Secondary | ICD-10-CM | POA: Diagnosis not present

## 2021-09-28 DIAGNOSIS — R0981 Nasal congestion: Secondary | ICD-10-CM | POA: Diagnosis not present

## 2021-09-28 MED ORDER — DOXYCYCLINE HYCLATE 100 MG PO TABS: 100.0000 mg | ORAL_TABLET | Freq: Two times a day (BID) | ORAL | 0 refills | Status: DC

## 2021-09-28 MED ORDER — BENZONATATE 100 MG PO CAPS: 100.0000 mg | ORAL_CAPSULE | Freq: Three times a day (TID) | ORAL | 0 refills | Status: DC | PRN

## 2021-09-28 NOTE — Patient Instructions (Addendum)
  HOME CARE TIPS:  -COVID19 testing information: [email or web address]  Most pharmacies also offer testing and home test kits.  -I sent the medication(s) we discussed to your pharmacy: Meds ordered this encounter  Medications   benzonatate (TESSALON PERLES) 100 MG capsule    Sig: Take 1 capsule (100 mg total) by mouth 3 (three) times daily as needed.    Dispense:  20 capsule    Refill:  0   doxycycline (VIBRA-TABS) 100 MG tablet    Sig: Take 1 tablet (100 mg total) by mouth 2 (two) times daily.    Dispense:  20 tablet    Refill:  0     -can use tylenol if needed for fevers, aches and pains per instructions  -can use nasal saline a few times per day if you have nasal congestion; sometimes  a short course of Afrin nasal spray for 3 days can help with symptoms as well  -stay hydrated, drink plenty of fluids and eat small healthy meals - avoid dairy  -If the Covid test is positive, check out the [facility] website for more information on home care, transmission and treatment for COVID19  -follow up with your doctor in 2-3 days unless improving and feeling better  It was nice to meet you today, and I really hope you are feeling better soon. I help [facility] out with telemedicine visits on Tuesdays and Thursdays and am available for visits on those days. If you have any concerns or questions following this visit please schedule a follow up visit with your Primary Care doctor or seek care at a local urgent care clinic to avoid delays in care.    Seek in person care or schedule a follow up video visit promptly if your symptoms worsen, new concerns arise or you are not improving with treatment. Call 911 and/or seek emergency care if your symptoms are severe or life threatening.

## 2021-09-28 NOTE — Progress Notes (Signed)
Virtual Visit via Video Note  I connected with Holly Obrien  on 09/28/21 at 10:20 AM EDT by a video enabled telemedicine application and verified that I am speaking with the correct person using two identifiers.  Location patient: home, [place] Location provider:work or home office Persons participating in the virtual visit: patient, provider  I discussed the limitations of evaluation and management by telemedicine and the availability of in person appointments. The patient expressed understanding and agreed to proceed.   HPI:  Acute telemedicine visit for : -Onset: 5 days ago has been bad - but she has baseline issues with irritants in the air -Symptoms include: nasal congestion, cough, sinus discomfort, thick green nasal discharge, mouth is dry -she feels this is a sinus infection and desires an abx -Denies:fevers, CP, SOB, NVD, body aches -Has tried:musinex, rubitussin, Tylenol, Flonase -Pertinent past medical history: see below -Pertinent medication allergies: Allergies  Allergen Reactions   Ciloxan [Ciprofloxacin Hcl] Swelling   Ciprofloxacin     Rash Ciloxan per eye doctor   Erythromycin Swelling    Face swelling   Penicillins     Rash as child; numbness & tickling neck & head  @22    Cefuroxime Axetil     uncertain   Sulfonamide Derivatives     ? reaction  -COVID-19 vaccine status:  Immunization History  Administered Date(s) Administered   Fluad Quad(high Dose 65+) 08/10/2019, 09/01/2020   Influenza Whole 08/27/2008, 09/15/2009   Influenza, High Dose Seasonal PF 09/07/2017, 08/21/2018   Influenza,inj,Quad PF,6+ Mos 09/07/2014, 09/02/2015, 08/02/2016   Influenza-Unspecified 09/23/2013, 08/11/2021   PFIZER Comirnaty(Gray Top)Covid-19 Tri-Sucrose Vaccine 04/29/2021   PFIZER(Purple Top)SARS-COV-2 Vaccination 01/16/2020, 02/09/2020, 09/10/2020   Pfizer Covid-19 Vaccine Bivalent Booster 48yrs & up 08/31/2021   Pneumococcal Conjugate-13 01/27/2015   Pneumococcal Polysaccharide-23  09/27/2009, 04/08/2018   Pneumococcal-Unspecified 10/15/1996   Tdap 11/26/2006, 01/30/2017   Zoster Recombinat (Shingrix) 06/05/2018, 08/21/2018   Zoster, Live 01/30/2016    ROS: See pertinent positives and negatives per HPI.  Past Medical History:  Diagnosis Date   Arthritis    Asthma    with URI's   Cataract    Chronic kidney disease    kidney mass   Complication of anesthesia    "One time, I had a hard time waking up"   Esophagitis    HH/ERD   GERD (gastroesophageal reflux disease)    Glaucoma    Hyperlipidemia    NMR 2007   Hypothyroidism    Thyroid disease    hypothyroidism    Past Surgical History:  Procedure Laterality Date   ABDOMINAL HYSTERECTOMY  04/30/88   Hysterectomy and bladder tact-- Dr Molli Hazard   CARPAL TUNNEL RELEASE  06/18/00   right wrist -- Dr Tamala Fothergill   FINGER SURGERY  12/10/06   left index finger injury--Dr Theodis Sato   OOPHORECTOMY  09/11/94, 01/14/96   1995-left ovary removed. 1997 Right ovary removed--Dr Molli Hazard   PUBOVAGINAL [facility]  1997   Dr Karsten Ro   ROBOTIC ASSITED PARTIAL NEPHRECTOMY Right 02/20/2017   Procedure: XI ROBOTIC ASSITED PARTIAL NEPHRECTOMY;  Surgeon: Alexis Frock, MD;  Location: [facility];  Service: Urology;  Laterality: Right;   TRIGGER FINGER RELEASE Right 01/2014   TRIGGER FINGER RELEASE Left 09/2014     Current Outpatient Medications:    albuterol (VENTOLIN HFA) 108 (90 Base) MCG/ACT inhaler, USE 1-2 PUFFS EVERY 4 HRS AS NEEDED, Disp: 18 each, Rfl: 2   aspirin EC 81 MG tablet, Take 81 mg by mouth daily., Disp: , Rfl:  B Complex-C (SUPER B COMPLEX PO), Take 1,000 mcg by mouth., Disp: , Rfl:    benzonatate (TESSALON PERLES) 100 MG capsule, Take 1 capsule (100 mg total) by mouth 3 (three) times daily as needed., Disp: 20 capsule, Rfl: 0   Calcium Carbonate (CALCIUM 600 PO), Take by mouth. + mag/zink/d3, Disp: , Rfl:    Cholecalciferol (VITAMIN D PO), Take 5,000 Int'l Units by mouth., Disp: , Rfl:    Coenzyme Q10 (CO  Q 10 PO), Take 300 mg by mouth daily., Disp: , Rfl:    doxycycline (VIBRA-TABS) 100 MG tablet, Take 1 tablet (100 mg total) by mouth 2 (two) times daily., Disp: 20 tablet, Rfl: 0   Echinacea-Goldenseal (ECHINACEA COMB/GOLDEN SEAL PO), Take 900 mg by mouth., Disp: , Rfl:    fluticasone (FLONASE) 50 MCG/ACT nasal spray, 1 spray in each nostril twice a day as needed. Use the "crossover" technique as discussed, Disp: 16 g, Rfl: 2   Fluticasone-Salmeterol (ADVAIR HFA IN), Inhale into the lungs. 108 prn, Disp: , Rfl:    Krill Oil (OMEGA-3) 500 MG CAPS, , Disp: , Rfl:    latanoprost (XALATAN) 0.005 % ophthalmic solution, Place 1 drop into both eyes at bedtime. , Disp: , Rfl: 3   levothyroxine (SYNTHROID) 75 MCG tablet, Take 1 tablet (75 mcg total) by mouth daily before breakfast., Disp: 90 tablet, Rfl: 1   MAGNESIUM PO, Take 250 mg by mouth., Disp: , Rfl:    POTASSIUM PO, Take 550 mg by mouth., Disp: , Rfl:    simvastatin (ZOCOR) 20 MG tablet, TAKE 1 TABLET BY MOUTH DAILY AT 6 PM., Disp: 90 tablet, Rfl: 1   timolol (BETIMOL) 0.5 % ophthalmic solution, Place 1 drop into both eyes daily., Disp: , Rfl:    UNABLE TO FIND, otc sleep aid melatonin, Disp: , Rfl:    Zinc 50 MG TABS, Take by mouth., Disp: , Rfl:   EXAM:  VITALS per patient if applicable:  GENERAL: alert, oriented, appears well and in no acute distress  HEENT: atraumatic, conjunttiva clear, no obvious abnormalities on inspection of external nose and ears  NECK: normal movements of the head and neck  LUNGS: on inspection no signs of respiratory distress, breathing rate appears normal, no obvious gross SOB, gasping or wheezing  CV: no obvious cyanosis  MS: moves all visible extremities without noticeable abnormality  PSYCH/NEURO: pleasant and cooperative, no obvious depression or anxiety, speech and thought processing grossly intact  ASSESSMENT AND PLAN:  Discussed the following assessment and plan:  Nasal sinus  congestion  Cough, unspecified type  -we discussed possible serious and likely etiologies, options for evaluation and workup, limitations of telemedicine visit vs in person visit, treatment, treatment risks and precautions. Pt is agreeable to treatment via telemedicine at this moment. Query viral URI, covid19 vs other. Discussed covid testing/tx window, nasal saline, tessalon for cough and delayed abx. Discussed likelihood of viral vs bacterial illness, proper use of abx, signs and symptoms of possible developing bacterial resp illness, etc.  Advised to seek prompt in person care if worsening, new symptoms arise, or if is not improving with treatment. Discussed options for inperson care if PCP office not available. Did let this patient know that I only do telemedicine on Tuesdays and Thursdays for [place]. Advised to schedule follow up visit with PCP or [facility] if any further questions or concerns to avoid delays in care.   I discussed the assessment and treatment plan with the patient. The patient was provided an opportunity  to ask questions and all were answered. The patient agreed with the plan and demonstrated an understanding of the instructions.     Lucretia Kern, DO

## 2021-10-06 ENCOUNTER — Other Ambulatory Visit: Payer: Self-pay | Admitting: Internal Medicine

## 2021-10-06 DIAGNOSIS — E038 Other specified hypothyroidism: Secondary | ICD-10-CM

## 2021-10-06 NOTE — Telephone Encounter (Signed)
Please refill as per office routine med refill policy (all routine meds to be refilled for 3 mo or monthly (per pt preference) up to one year from last visit, then month to month grace period for 3 mo, then further med refills will have to be denied) ? ?

## 2021-10-06 NOTE — Telephone Encounter (Signed)
Sorry - this was meant for to PCP, thanks

## 2021-10-09 ENCOUNTER — Other Ambulatory Visit: Payer: Self-pay

## 2021-10-09 ENCOUNTER — Encounter: Payer: Self-pay | Admitting: Internal Medicine

## 2021-10-09 ENCOUNTER — Ambulatory Visit: Payer: Federal, State, Local not specified - PPO | Admitting: Internal Medicine

## 2021-10-09 DIAGNOSIS — R7303 Prediabetes: Secondary | ICD-10-CM | POA: Insufficient documentation

## 2021-10-09 LAB — POCT GLYCOSYLATED HEMOGLOBIN (HGB A1C): Hemoglobin A1C: 5.8 % — AB (ref 4.0–5.6)

## 2021-10-09 NOTE — Patient Instructions (Addendum)
We have done the POC HgA1c today which is 5.8

## 2021-10-09 NOTE — Assessment & Plan Note (Signed)
POC HgA1c done today and was 5.8. Congratulated her on the lifestyle changes and to continue those. Not on medication. Recheck HgA1c every 6-12 months.

## 2021-10-09 NOTE — Progress Notes (Signed)
   Subjective:   Patient ID: Holly Obrien, female    DOB: 05-17-1951, 70 y.o.   MRN: [id number]  HPI The patient is a 70 YO female coming in for follow up medical conditions.   Review of Systems  Constitutional: Negative.   HENT: Negative.    Eyes: Negative.   Respiratory:  Negative for cough, chest tightness and shortness of breath.   Cardiovascular:  Negative for chest pain, palpitations and leg swelling.  Gastrointestinal:  Negative for abdominal distention, abdominal pain, constipation, diarrhea, nausea and vomiting.  Musculoskeletal: Negative.   Skin: Negative.   Neurological: Negative.   Psychiatric/Behavioral: Negative.     Objective:  Physical Exam Constitutional:      Appearance: She is well-developed.  HENT:     Head: Normocephalic and atraumatic.  Cardiovascular:     Rate and Rhythm: Normal rate and regular rhythm.  Pulmonary:     Effort: Pulmonary effort is normal. No respiratory distress.     Breath sounds: Normal breath sounds. No wheezing or rales.  Abdominal:     General: Bowel sounds are normal. There is no distension.     Palpations: Abdomen is soft.     Tenderness: There is no abdominal tenderness. There is no rebound.  Musculoskeletal:     Cervical back: Normal range of motion.  Skin:    General: Skin is warm and dry.  Neurological:     Mental Status: She is alert and oriented to person, place, and time.     Coordination: Coordination normal.    Vitals:   10/09/21 1108  BP: 130/68  Pulse: 75  Resp: 18  SpO2: 95%  Weight: 215 lb 6.4 oz (97.7 kg)  Height: 5\' 3"  (1.6 m)    This visit occurred during the SARS-CoV-2 public health emergency.  Safety protocols were in place, including screening questions prior to the visit, additional usage of staff PPE, and extensive cleaning of exam room while observing appropriate contact time as indicated for disinfecting solutions.   Assessment & Plan:

## 2021-10-28 ENCOUNTER — Other Ambulatory Visit: Payer: Self-pay | Admitting: Internal Medicine

## 2021-10-28 DIAGNOSIS — E038 Other specified hypothyroidism: Secondary | ICD-10-CM

## 2021-10-29 NOTE — Telephone Encounter (Signed)
Please refill as per office routine med refill policy (all routine meds to be refilled for 3 mo or monthly (per pt preference) up to one year from last visit, then month to month grace period for 3 mo, then further med refills will have to be denied) ? ?

## 2021-11-03 ENCOUNTER — Other Ambulatory Visit: Payer: Self-pay | Admitting: Internal Medicine

## 2021-11-03 DIAGNOSIS — J310 Chronic rhinitis: Secondary | ICD-10-CM

## 2021-11-26 ENCOUNTER — Other Ambulatory Visit: Payer: Self-pay | Admitting: Internal Medicine

## 2021-11-26 DIAGNOSIS — E038 Other specified hypothyroidism: Secondary | ICD-10-CM

## 2022-01-26 ENCOUNTER — Encounter: Payer: Self-pay | Admitting: Internal Medicine

## 2022-02-01 ENCOUNTER — Other Ambulatory Visit: Payer: Self-pay | Admitting: Internal Medicine

## 2022-02-01 DIAGNOSIS — E782 Mixed hyperlipidemia: Secondary | ICD-10-CM

## 2022-02-12 LAB — COLOGUARD: COLOGUARD: NEGATIVE

## 2022-04-04 DIAGNOSIS — M21622 Bunionette of left foot: Secondary | ICD-10-CM | POA: Insufficient documentation

## 2022-05-23 ENCOUNTER — Other Ambulatory Visit: Payer: Self-pay | Admitting: Internal Medicine

## 2022-05-23 DIAGNOSIS — E038 Other specified hypothyroidism: Secondary | ICD-10-CM

## 2022-07-18 ENCOUNTER — Other Ambulatory Visit: Payer: Self-pay | Admitting: Internal Medicine

## 2022-07-18 DIAGNOSIS — Z1231 Encounter for screening mammogram for malignant neoplasm of breast: Secondary | ICD-10-CM

## 2022-07-19 ENCOUNTER — Ambulatory Visit
Admission: RE | Admit: 2022-07-19 | Discharge: 2022-07-19 | Disposition: A | Discharge status: Home / Self Care | Payer: Federal, State, Local not specified - PPO | Source: Ambulatory Visit | Attending: Internal Medicine | Admitting: Internal Medicine

## 2022-07-19 DIAGNOSIS — Z1231 Encounter for screening mammogram for malignant neoplasm of breast: Secondary | ICD-10-CM

## 2022-07-23 ENCOUNTER — Other Ambulatory Visit: Payer: Self-pay | Admitting: Internal Medicine

## 2022-07-23 DIAGNOSIS — R928 Other abnormal and inconclusive findings on diagnostic imaging of breast: Secondary | ICD-10-CM

## 2022-07-29 ENCOUNTER — Other Ambulatory Visit: Payer: Self-pay | Admitting: Family Medicine

## 2022-07-29 DIAGNOSIS — E782 Mixed hyperlipidemia: Secondary | ICD-10-CM

## 2022-08-02 ENCOUNTER — Ambulatory Visit: Payer: Federal, State, Local not specified - PPO

## 2022-08-02 ENCOUNTER — Ambulatory Visit
Admission: RE | Admit: 2022-08-02 | Discharge: 2022-08-02 | Disposition: A | Discharge status: Home / Self Care | Payer: Federal, State, Local not specified - PPO | Source: Ambulatory Visit | Attending: Internal Medicine | Admitting: Internal Medicine

## 2022-08-02 DIAGNOSIS — R928 Other abnormal and inconclusive findings on diagnostic imaging of breast: Secondary | ICD-10-CM

## 2022-08-19 ENCOUNTER — Other Ambulatory Visit: Payer: Self-pay | Admitting: Internal Medicine

## 2022-08-19 DIAGNOSIS — E038 Other specified hypothyroidism: Secondary | ICD-10-CM

## 2022-09-11 ENCOUNTER — Encounter: Payer: Self-pay | Admitting: Internal Medicine

## 2022-10-16 ENCOUNTER — Encounter: Payer: Self-pay | Admitting: Internal Medicine

## 2022-10-16 ENCOUNTER — Ambulatory Visit (INDEPENDENT_AMBULATORY_CARE_PROVIDER_SITE_OTHER): Payer: Federal, State, Local not specified - PPO | Admitting: Internal Medicine

## 2022-10-16 VITALS — BP 180/64 | HR 80 | Temp 97.9°F | Ht 63.0 in | Wt 213.0 lb

## 2022-10-16 DIAGNOSIS — E559 Vitamin D deficiency, unspecified: Secondary | ICD-10-CM

## 2022-10-16 DIAGNOSIS — E038 Other specified hypothyroidism: Secondary | ICD-10-CM | POA: Diagnosis not present

## 2022-10-16 DIAGNOSIS — R7303 Prediabetes: Secondary | ICD-10-CM

## 2022-10-16 DIAGNOSIS — J4520 Mild intermittent asthma, uncomplicated: Secondary | ICD-10-CM

## 2022-10-16 DIAGNOSIS — Z8673 Personal history of transient ischemic attack (TIA), and cerebral infarction without residual deficits: Secondary | ICD-10-CM

## 2022-10-16 DIAGNOSIS — F4323 Adjustment disorder with mixed anxiety and depressed mood: Secondary | ICD-10-CM

## 2022-10-16 DIAGNOSIS — E782 Mixed hyperlipidemia: Secondary | ICD-10-CM | POA: Diagnosis not present

## 2022-10-16 DIAGNOSIS — Z0000 Encounter for general adult medical examination without abnormal findings: Secondary | ICD-10-CM

## 2022-10-16 DIAGNOSIS — F4320 Adjustment disorder, unspecified: Secondary | ICD-10-CM | POA: Insufficient documentation

## 2022-10-16 DIAGNOSIS — K760 Fatty (change of) liver, not elsewhere classified: Secondary | ICD-10-CM

## 2022-10-16 DIAGNOSIS — R42 Dizziness and giddiness: Secondary | ICD-10-CM

## 2022-10-16 LAB — COMPREHENSIVE METABOLIC PANEL
ALT: 30 U/L (ref 0–35)
AST: 31 U/L (ref 0–37)
Albumin: 4.2 g/dL (ref 3.5–5.2)
Alkaline Phosphatase: 125 U/L — ABNORMAL HIGH (ref 39–117)
BUN: 24 mg/dL — ABNORMAL HIGH (ref 6–23)
CO2: 28 mEq/L (ref 19–32)
Calcium: 9.5 mg/dL (ref 8.4–10.5)
Chloride: 103 mEq/L (ref 96–112)
Creatinine, Ser: 0.98 mg/dL (ref 0.40–1.20)
GFR: 58.5 mL/min — ABNORMAL LOW (ref >60.00)
Glucose, Bld: 107 mg/dL — ABNORMAL HIGH (ref 70–99)
Potassium: 4.8 mEq/L (ref 3.5–5.1)
Sodium: 139 mEq/L (ref 135–145)
Total Bilirubin: 0.4 mg/dL (ref 0.2–1.2)
Total Protein: 7.6 g/dL (ref 6.0–8.3)

## 2022-10-16 LAB — LIPID PANEL
Cholesterol: 158 mg/dL (ref 0–200)
HDL: 40.7 mg/dL (ref >39.00)
NonHDL: 117.79
Total CHOL/HDL Ratio: 4
Triglycerides: 220 mg/dL — ABNORMAL HIGH (ref 0.0–149.0)
VLDL: 44 mg/dL — ABNORMAL HIGH (ref 0.0–40.0)

## 2022-10-16 LAB — T4, FREE: Free T4: 1.05 ng/dL (ref 0.60–1.60)

## 2022-10-16 LAB — VITAMIN B12: Vitamin B-12: 351 pg/mL (ref 211–911)

## 2022-10-16 LAB — HEMOGLOBIN A1C: Hgb A1c MFr Bld: 6.2 % (ref 4.6–6.5)

## 2022-10-16 LAB — CBC
HCT: 42.7 % (ref 36.0–46.0)
Hemoglobin: 14.3 g/dL (ref 12.0–15.0)
MCHC: 33.5 g/dL (ref 30.0–36.0)
MCV: 90.5 fl (ref 78.0–100.0)
Platelets: 294 10*3/uL (ref 150.0–400.0)
RBC: 4.72 Mil/uL (ref 3.87–5.11)
RDW: 13.3 % (ref 11.5–15.5)
WBC: 7.5 10*3/uL (ref 4.0–10.5)

## 2022-10-16 LAB — LDL CHOLESTEROL, DIRECT: Direct LDL: 103 mg/dL

## 2022-10-16 LAB — VITAMIN D 25 HYDROXY (VIT D DEFICIENCY, FRACTURES): VITD: 63.67 ng/mL (ref 30.00–100.00)

## 2022-10-16 LAB — TSH: TSH: 3.02 u[IU]/mL (ref 0.35–5.50)

## 2022-10-16 MED ORDER — BUSPIRONE HCL 5 MG PO TABS: 5.0000 mg | ORAL_TABLET | Freq: Two times a day (BID) | ORAL | 3 refills | Status: DC

## 2022-10-16 NOTE — Assessment & Plan Note (Signed)
Checking HgA1c and adjust as needed.  

## 2022-10-16 NOTE — Assessment & Plan Note (Signed)
Checking TSH and free T4 and adjust synthroid 75 mcg daily as needed.  

## 2022-10-16 NOTE — Assessment & Plan Note (Signed)
Checking vitamin D level today and adjust as needed. Some hair thinning which may be related.

## 2022-10-16 NOTE — Assessment & Plan Note (Signed)
Checking lipid panel and adjust simvastatin 20 mg daily as needed.  

## 2022-10-16 NOTE — Assessment & Plan Note (Signed)
Rx buspar 5 mg BID prn for anxiety. She is having a lot of stress husband with stroke this year and mother with worsening overall function. She does not have time for self care. BP is elevated today due to recent stress.

## 2022-10-16 NOTE — Assessment & Plan Note (Signed)
No flare today using advair and albuterol prn. Takes allergy medication to help avoid flare.

## 2022-10-16 NOTE — Assessment & Plan Note (Signed)
BP needs good control which is not good today. She will monitor at home and work on stress management to help. Checking lipid panel. Had 1 episode of numbness face lasted less than 1 minute counseled about when to seek care.

## 2022-10-16 NOTE — Assessment & Plan Note (Signed)
Checking CMP and adjust as needed.  

## 2022-10-16 NOTE — Progress Notes (Signed)
   Subjective:   Patient ID: Holly Obrien, female    DOB: March 11, 1951, 71 y.o.   MRN: [id number]  HPI The patient is here for physical with new concerns.  PMH, [facility], social history reviewed and updated  Review of Systems  Constitutional: Negative.   HENT: Negative.    Eyes: Negative.   Respiratory:  Negative for cough, chest tightness and shortness of breath.   Cardiovascular:  Negative for chest pain, palpitations and leg swelling.  Gastrointestinal:  Negative for abdominal distention, abdominal pain, constipation, diarrhea, nausea and vomiting.  Musculoskeletal: Negative.   Skin: Negative.   Neurological: Negative.   Psychiatric/Behavioral:  Positive for dysphoric mood. The patient is nervous/anxious.     Objective:  Physical Exam Constitutional:      Appearance: She is well-developed.  HENT:     Head: Normocephalic and atraumatic.  Cardiovascular:     Rate and Rhythm: Normal rate and regular rhythm.  Pulmonary:     Effort: Pulmonary effort is normal. No respiratory distress.     Breath sounds: Normal breath sounds. No wheezing or rales.  Abdominal:     General: Bowel sounds are normal. There is no distension.     Palpations: Abdomen is soft.     Tenderness: There is no abdominal tenderness. There is no rebound.  Musculoskeletal:        General: Tenderness present.     Cervical back: Normal range of motion.  Skin:    General: Skin is warm and dry.  Neurological:     Mental Status: She is alert and oriented to person, place, and time.     Coordination: Coordination normal.     Vitals:   10/16/22 1353  BP: (!) 180/100  Pulse: 80  Temp: 97.9 F (36.6 C)  TempSrc: Oral  SpO2: 98%  Weight: 213 lb (96.6 kg)  Height: '5\' 3"'$  (1.6 m)    Assessment & Plan:

## 2022-10-16 NOTE — Assessment & Plan Note (Signed)
Still present without change.

## 2022-10-16 NOTE — Assessment & Plan Note (Signed)
Flu shot up to date. Covid-19 up to date. Pneumonia complete. Shingrix complete. Tetanus up to date. Cologuard up to date. Mammogram up to date, pap smear complete and dexa complete. Counseled about sun safety and mole surveillance. Counseled about the dangers of distracted driving. Given 10 year screening recommendations.

## 2022-11-08 ENCOUNTER — Other Ambulatory Visit: Payer: Self-pay | Admitting: Internal Medicine

## 2022-11-16 ENCOUNTER — Other Ambulatory Visit: Payer: Self-pay | Admitting: Internal Medicine

## 2022-11-16 DIAGNOSIS — E038 Other specified hypothyroidism: Secondary | ICD-10-CM

## 2022-12-08 ENCOUNTER — Other Ambulatory Visit: Payer: Self-pay | Admitting: Internal Medicine

## 2022-12-14 DIAGNOSIS — R52 Pain, unspecified: Secondary | ICD-10-CM | POA: Insufficient documentation

## 2022-12-20 ENCOUNTER — Telehealth: Payer: Self-pay | Admitting: Internal Medicine

## 2022-12-20 MED ORDER — NIRMATRELVIR/RITONAVIR (PAXLOVID) TABLET (RENAL DOSING): 2.0000 | ORAL_TABLET | Freq: Two times a day (BID) | ORAL | 0 refills | Status: AC

## 2022-12-20 NOTE — Telephone Encounter (Signed)
Patient was seen at an urgent care and has covid. Husband will bring urgent care notes by our office. Patient wants to know if Dr. Sharlet Salina will prescribe Paxlovid. Urgent care would not prescribe without recent lab results

## 2022-12-20 NOTE — Telephone Encounter (Signed)
Ok this is done 

## 2022-12-20 NOTE — Telephone Encounter (Signed)
Waiting on patient to give [place] a call back to see how long symptoms have been.

## 2022-12-20 NOTE — Addendum Note (Signed)
Addended by: Biagio Borg on: 12/20/2022 04:26 PM   Modules accepted: Orders

## 2023-01-08 ENCOUNTER — Other Ambulatory Visit: Payer: Self-pay | Admitting: Internal Medicine

## 2023-01-08 DIAGNOSIS — J4521 Mild intermittent asthma with (acute) exacerbation: Secondary | ICD-10-CM

## 2023-01-14 DIAGNOSIS — M79644 Pain in right finger(s): Secondary | ICD-10-CM | POA: Insufficient documentation

## 2023-01-25 ENCOUNTER — Other Ambulatory Visit: Payer: Self-pay | Admitting: Internal Medicine

## 2023-01-25 ENCOUNTER — Other Ambulatory Visit: Payer: Self-pay | Admitting: Radiology

## 2023-01-25 DIAGNOSIS — E782 Mixed hyperlipidemia: Secondary | ICD-10-CM

## 2023-02-10 ENCOUNTER — Other Ambulatory Visit: Payer: Self-pay | Admitting: Internal Medicine

## 2023-02-10 DIAGNOSIS — E038 Other specified hypothyroidism: Secondary | ICD-10-CM

## 2023-04-30 ENCOUNTER — Ambulatory Visit: Payer: Federal, State, Local not specified - PPO | Admitting: Internal Medicine

## 2023-05-08 ENCOUNTER — Other Ambulatory Visit: Payer: Self-pay | Admitting: Internal Medicine

## 2023-05-08 DIAGNOSIS — E038 Other specified hypothyroidism: Secondary | ICD-10-CM

## 2023-08-03 ENCOUNTER — Other Ambulatory Visit: Payer: Self-pay | Admitting: Internal Medicine

## 2023-08-03 DIAGNOSIS — E038 Other specified hypothyroidism: Secondary | ICD-10-CM

## 2023-10-20 ENCOUNTER — Other Ambulatory Visit: Payer: Self-pay | Admitting: Internal Medicine

## 2023-10-20 DIAGNOSIS — E782 Mixed hyperlipidemia: Secondary | ICD-10-CM

## 2023-11-08 ENCOUNTER — Other Ambulatory Visit: Payer: Self-pay | Admitting: Internal Medicine

## 2023-11-08 DIAGNOSIS — E038 Other specified hypothyroidism: Secondary | ICD-10-CM

## 2023-12-05 ENCOUNTER — Encounter: Payer: Self-pay | Admitting: Internal Medicine

## 2023-12-05 ENCOUNTER — Ambulatory Visit: Payer: BC Managed Care – PPO | Admitting: Internal Medicine

## 2023-12-05 VITALS — BP 138/78 | HR 78 | Temp 97.8°F | Ht 63.0 in | Wt 216.0 lb

## 2023-12-05 DIAGNOSIS — E038 Other specified hypothyroidism: Secondary | ICD-10-CM | POA: Diagnosis not present

## 2023-12-05 DIAGNOSIS — R7303 Prediabetes: Secondary | ICD-10-CM

## 2023-12-05 DIAGNOSIS — E559 Vitamin D deficiency, unspecified: Secondary | ICD-10-CM

## 2023-12-05 DIAGNOSIS — F4323 Adjustment disorder with mixed anxiety and depressed mood: Secondary | ICD-10-CM

## 2023-12-05 DIAGNOSIS — Z0000 Encounter for general adult medical examination without abnormal findings: Secondary | ICD-10-CM | POA: Diagnosis not present

## 2023-12-05 DIAGNOSIS — J4520 Mild intermittent asthma, uncomplicated: Secondary | ICD-10-CM

## 2023-12-05 DIAGNOSIS — E782 Mixed hyperlipidemia: Secondary | ICD-10-CM | POA: Diagnosis not present

## 2023-12-05 DIAGNOSIS — Z8673 Personal history of transient ischemic attack (TIA), and cerebral infarction without residual deficits: Secondary | ICD-10-CM

## 2023-12-05 DIAGNOSIS — K219 Gastro-esophageal reflux disease without esophagitis: Secondary | ICD-10-CM

## 2023-12-05 LAB — COMPREHENSIVE METABOLIC PANEL
ALT: 40 U/L — ABNORMAL HIGH (ref 0–35)
AST: 28 U/L (ref 0–37)
Albumin: 4.1 g/dL (ref 3.5–5.2)
Alkaline Phosphatase: 87 U/L (ref 39–117)
BUN: 26 mg/dL — ABNORMAL HIGH (ref 6–23)
CO2: 31 meq/L (ref 19–32)
Calcium: 9.6 mg/dL (ref 8.4–10.5)
Chloride: 103 meq/L (ref 96–112)
Creatinine, Ser: 0.76 mg/dL (ref 0.40–1.20)
GFR: 78.74 mL/min (ref >60.00)
Glucose, Bld: 100 mg/dL — ABNORMAL HIGH (ref 70–99)
Potassium: 5.1 meq/L (ref 3.5–5.1)
Sodium: 140 meq/L (ref 135–145)
Total Bilirubin: 0.7 mg/dL (ref 0.2–1.2)
Total Protein: 7.5 g/dL (ref 6.0–8.3)

## 2023-12-05 LAB — LIPID PANEL
Cholesterol: 138 mg/dL (ref 0–200)
HDL: 40.3 mg/dL (ref >39.00)
LDL Cholesterol: 78 mg/dL (ref 0–99)
NonHDL: 97.43
Total CHOL/HDL Ratio: 3
Triglycerides: 96 mg/dL (ref 0.0–149.0)
VLDL: 19.2 mg/dL (ref 0.0–40.0)

## 2023-12-05 LAB — HEMOGLOBIN A1C: Hgb A1c MFr Bld: 6.3 % (ref 4.6–6.5)

## 2023-12-05 LAB — CBC
HCT: 44.2 % (ref 36.0–46.0)
Hemoglobin: 14.5 g/dL (ref 12.0–15.0)
MCHC: 32.8 g/dL (ref 30.0–36.0)
MCV: 91.6 fL (ref 78.0–100.0)
Platelets: 356 10*3/uL (ref 150.0–400.0)
RBC: 4.82 Mil/uL (ref 3.87–5.11)
RDW: 14 % (ref 11.5–15.5)
WBC: 6.7 10*3/uL (ref 4.0–10.5)

## 2023-12-05 LAB — VITAMIN D 25 HYDROXY (VIT D DEFICIENCY, FRACTURES): VITD: 63.65 ng/mL (ref 30.00–100.00)

## 2023-12-05 LAB — VITAMIN B12: Vitamin B-12: 365 pg/mL (ref 211–911)

## 2023-12-05 LAB — TSH: TSH: 5.55 u[IU]/mL — ABNORMAL HIGH (ref 0.35–5.50)

## 2023-12-05 LAB — T4, FREE: Free T4: 1.2 ng/dL (ref 0.60–1.60)

## 2023-12-05 NOTE — Assessment & Plan Note (Signed)
 Using buspar 5 mg BID and controlled.

## 2023-12-05 NOTE — Assessment & Plan Note (Signed)
Checking lipid panel and adjust as needed simvastatin 20 mg daily.  

## 2023-12-05 NOTE — Assessment & Plan Note (Signed)
 Checking HgA1c and adjust as needed.

## 2023-12-05 NOTE — Assessment & Plan Note (Signed)
 Taking nexium 20 mg daily and well controlled.

## 2023-12-05 NOTE — Assessment & Plan Note (Signed)
 Checking TSH and free T4 and adjust as needed levothyroxine 75 mcg daily. She is having more fatigue.

## 2023-12-05 NOTE — Assessment & Plan Note (Signed)
 Checking vitamin D and adjust as needed.

## 2023-12-05 NOTE — Assessment & Plan Note (Signed)
 Flu shot up to date. Pneumonia complete. Shingrix  complete. Tetanus up to date. Cologuard up to date. Mammogram up to date, pap smear aged out and dexa complete. Counseled about sun safety and mole surveillance. Counseled about the dangers of distracted driving. Given 10 year screening recommendations.

## 2023-12-05 NOTE — Progress Notes (Signed)
   Subjective:   Patient ID: Holly Obrien, female    DOB: 05/12/1951, 73 y.o.   MRN: [id number]  HPI The patient is here for physical.  PMH, [facility], social history reviewed and updated  Review of Systems  Constitutional:  Positive for fatigue.  HENT: Negative.    Eyes: Negative.   Respiratory:  Negative for cough, chest tightness and shortness of breath.   Cardiovascular:  Negative for chest pain, palpitations and leg swelling.  Gastrointestinal:  Negative for abdominal distention, abdominal pain, constipation, diarrhea, nausea and vomiting.  Musculoskeletal: Negative.   Skin: Negative.   Neurological: Negative.   Psychiatric/Behavioral: Negative.      Objective:  Physical Exam Constitutional:      Appearance: She is well-developed.  HENT:     Head: Normocephalic and atraumatic.  Cardiovascular:     Rate and Rhythm: Normal rate and regular rhythm.  Pulmonary:     Effort: Pulmonary effort is normal. No respiratory distress.     Breath sounds: Normal breath sounds. No wheezing or rales.  Abdominal:     General: Bowel sounds are normal. There is no distension.     Palpations: Abdomen is soft.     Tenderness: There is no abdominal tenderness. There is no rebound.  Musculoskeletal:     Cervical back: Normal range of motion.  Skin:    General: Skin is warm and dry.  Neurological:     Mental Status: She is alert and oriented to person, place, and time.     Coordination: Coordination normal.     Vitals:   12/05/23 1012  BP: 138/78  Pulse: 78  Temp: 97.8 F (36.6 C)  TempSrc: Oral  SpO2: 98%  Weight: 216 lb (98 kg)  Height: 5' 3 (1.6 m)    Assessment & Plan:

## 2023-12-05 NOTE — Assessment & Plan Note (Signed)
 Checking HgA1c and lipid and BP normal.

## 2023-12-05 NOTE — Assessment & Plan Note (Signed)
 No flare using advair and albuterol as needed.

## 2023-12-07 ENCOUNTER — Other Ambulatory Visit: Payer: Self-pay | Admitting: Internal Medicine

## 2023-12-07 DIAGNOSIS — E038 Other specified hypothyroidism: Secondary | ICD-10-CM

## 2023-12-07 DIAGNOSIS — E782 Mixed hyperlipidemia: Secondary | ICD-10-CM

## 2024-03-05 DIAGNOSIS — M654 Radial styloid tenosynovitis [de Quervain]: Secondary | ICD-10-CM | POA: Insufficient documentation

## 2024-03-05 DIAGNOSIS — M25531 Pain in right wrist: Secondary | ICD-10-CM | POA: Insufficient documentation

## 2024-05-01 ENCOUNTER — Ambulatory Visit
Admission: EM | Admit: 2024-05-01 | Discharge: 2024-05-01 | Disposition: A | Discharge status: Home / Self Care | Attending: Emergency Medicine | Admitting: Emergency Medicine

## 2024-05-01 DIAGNOSIS — J019 Acute sinusitis, unspecified: Secondary | ICD-10-CM

## 2024-05-01 DIAGNOSIS — B9789 Other viral agents as the cause of diseases classified elsewhere: Secondary | ICD-10-CM | POA: Diagnosis not present

## 2024-05-01 MED ORDER — DOXYCYCLINE HYCLATE 100 MG PO CAPS: 100.0000 mg | ORAL_CAPSULE | Freq: Two times a day (BID) | ORAL | 0 refills | Status: DC

## 2024-05-01 MED ORDER — IPRATROPIUM BROMIDE 0.03 % NA SOLN: 2.0000 | Freq: Two times a day (BID) | NASAL | 0 refills | Status: AC

## 2024-05-01 MED ORDER — PREDNISONE 10 MG (21) PO TBPK: ORAL_TABLET | Freq: Every day | ORAL | 0 refills | Status: DC

## 2024-05-01 NOTE — ED Provider Notes (Signed)
 Holly Obrien    CSN: [id number] Arrival date & time: 05/01/24  0913      History   Chief Complaint Chief Complaint  Patient presents with   Facial Pain         HPI Holly Obrien is a 73 y.o. female.   Patient presents for ration of intermittent headaches, nasal congestion, sinus pressure to the bridge of the nose and a dry throat and mouth present for 4 days.  Tolerating food and liquids.  Has been visiting in and out of the hospital and nursing home therefore possible sick contact.  Has attempted use of Mucinex .  Denies fever, ear pain, cough.  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   History of cerebellar stroke 11/29/2016   Coronary arteriosclerosis in native artery 11/29/2016   Fatty liver disease, nonalcoholic 11/29/2016   Lumbosacral radiculopathy 10/24/2016   Routine general medical examination at a health care facility 01/31/2016   Vitamin D  deficiency 05/30/2010   Asthma 09/20/2009   Hypothyroidism 06/07/2009   Hyperlipidemia 06/07/2009   GERD 06/07/2009    Past Surgical History:  Procedure Laterality Date   ABDOMINAL HYSTERECTOMY  04/30/88   Hysterectomy and bladder tact-- Dr Roslyn Coombe   CARPAL TUNNEL RELEASE  06/18/00   right wrist -- Dr Pleas Brill   FINGER SURGERY  12/10/06   left index finger injury--Dr Clerance Dais   OOPHORECTOMY  09/11/94,  01/14/96   1995-left ovary removed. 1997 Right ovary removed--Dr Roslyn Coombe   PUBOVAGINAL [facility]  1997   Dr Grady Lawman   ROBOTIC ASSITED PARTIAL NEPHRECTOMY Right 02/20/2017   Procedure: XI ROBOTIC ASSITED PARTIAL NEPHRECTOMY;  Surgeon: Osborn Blaze, MD;  Location: [facility];  Service: Urology;  Laterality: Right;   TRIGGER FINGER RELEASE Right 01/2014   TRIGGER FINGER RELEASE Left 09/2014    OB History     Gravida  2   Para  2   Term  2   Preterm  0   AB  0   Living  2      SAB  0   IAB  0   Ectopic  0   Multiple  0   Live Births  2            Home Medications    Prior to Admission medications   Medication Sig Start Date End Date Taking? Authorizing Provider  albuterol  (VENTOLIN  HFA) 108 (90 Base) MCG/ACT inhaler USE 1-2 PUFFS EVERY 4 HRS AS NEEDED 01/08/23  Yes Adelia Homestead, MD  aspirin EC 81 MG tablet Take 81 mg by mouth daily.   Yes [provider]  B Complex-C (SUPER B COMPLEX PO) Take 1,000 mcg by mouth.   Yes [provider]  busPIRone  (BUSPAR ) 5 MG tablet TAKE 1 TABLET BY MOUTH TWICE A DAY 12/10/22  Yes Adelia Homestead, MD  Calcium Carbonate (CALCIUM 600 PO) Take by mouth. + mag/zink/d3   Yes [provider]  Cholecalciferol (VITAMIN D  PO) Take 5,000 Int'l Units by mouth.   Yes [provider]  Coenzyme Q10 (CO Q 10 PO) Take 300 mg by mouth daily.   Yes [provider]  Echinacea-Goldenseal (ECHINACEA COMB/GOLDEN SEAL PO) Take 900 mg by mouth.   Yes [provider]  esomeprazole  (NEXIUM ) 20 MG packet Take 20 mg by mouth daily before breakfast.   Yes [provider]  fluticasone  (FLONASE ) 50 MCG/ACT nasal spray SPRAY 1 SPRAY IN EACH NOSTRIL TWICE A DAY AS NEEDED. USE THE "CROSSOVER" TECHNIQUE AS DISCUSSED 11/06/21  Yes Adelia Homestead, MD  Fluticasone -Salmeterol (ADVAIR HFA IN) Inhale into the lungs. 108 prn   Yes [provider]  Joneen Nelson (OMEGA-3) 500 MG CAPS  03/10/18   Yes [provider]  latanoprost  (XALATAN ) 0.005 % ophthalmic solution Place 1 drop into both eyes at bedtime.  07/11/15  Yes [provider]  levothyroxine  (SYNTHROID ) 75 MCG tablet TAKE 1 TABLET BY MOUTH EVERY DAY BEFORE BREAKFAST 12/09/23  Yes Adelia Homestead, MD  MAGNESIUM  PO Take 250 mg by mouth.   Yes [provider]  POTASSIUM PO Take 550 mg by mouth.   Yes [provider]  simvastatin  (ZOCOR ) 20 MG tablet TAKE 1 TABLET BY MOUTH DAILY AT 6 PM. 12/09/23  Yes Adelia Homestead, MD  timolol  (BETIMOL ) 0.5 % ophthalmic solution Place 1 drop into both eyes daily.   Yes [provider]  UNABLE TO FIND otc sleep aid melatonin   Yes [provider]  Zinc 50 MG TABS Take by mouth.   Yes [provider]  doxycycline  (VIBRAMYCIN ) 100 MG capsule Take 1 capsule (100 mg total) by mouth 2 (two) times daily. 05/01/24  Yes Aniyia Rane R, NP  ipratropium (ATROVENT ) 0.03 % nasal spray Place 2 sprays into both nostrils every 12 (twelve) hours. 05/01/24  Yes Jeronimo Hellberg R, NP  predniSONE  (STERAPRED UNI-PAK 21 TAB) 10 MG (21) TBPK tablet Take by mouth daily. Take 6 tabs by mouth daily  for 1 days, then 5 tabs for 1 days, then 4 tabs for 1 days, then 3 tabs for 1 days, 2 tabs for 1 days, then 1 tab by mouth daily for 1 days 05/01/24  Yes Jelani Vreeland, [name], NP    Family History Family History  Problem Relation Age of Onset   Arthritis Other    Depression Other    Thyroid  disease Other        hyperthyroidism   Stroke Other    Heart disease Other        CABG   Diabetes Other        hypoglycemia   Osteoarthritis Mother    Hyperlipidemia Mother    Hypertension Mother    Thyroid  disease Mother    Heart failure Father    Cancer Father        mesothelioma   Heart failure Brother    Hypertension Brother    Hypertension Sister     Social History Social History   Tobacco Use   Smoking status: Never   Smokeless tobacco: Never   Vaping Use   Vaping status: Never Used  Substance Use Topics   Alcohol use: No   Drug use: No     Allergies  Ciloxan [ciprofloxacin hcl], Ciprofloxacin, Erythromycin, Penicillins, Cefuroxime axetil, and Sulfonamide derivatives   Review of Systems Review of Systems   Physical Exam Triage Vital Signs ED Triage Vitals  Encounter Vitals Group     BP 05/01/24 0935 118/75     Systolic BP Percentile --      Diastolic BP Percentile --      Pulse Rate 05/01/24 0935 93     Resp --      Temp 05/01/24 0935 98.4 F (36.9 C)     Temp Source 05/01/24 0935 Oral     SpO2 05/01/24 0935 96 %     Weight 05/01/24 0937 200 lb (90.7 kg)     Height 05/01/24 0937 5\' 3"  (1.6 m)     Head Circumference --      Peak Flow --      Pain Score 05/01/24 0936 5     Pain Loc --      Pain Education --      Exclude from Growth Chart --    No data found.  Updated Vital Signs BP 118/75 (BP Location: Left Arm)   Pulse 93   Temp 98.4 F (36.9 C) (Oral)   Ht 5\' 3"  (1.6 m)   Wt 200 lb (90.7 kg)   LMP 05/01/1987   SpO2 96%   BMI 35.43 kg/m   Visual Acuity Right Eye Distance:   Left Eye Distance:   Bilateral Distance:    Right Eye Near:   Left Eye Near:    Bilateral Near:     Physical Exam Constitutional:      Appearance: Normal appearance.  HENT:     Head: Normocephalic.     Right Ear: Tympanic membrane, ear canal and external ear normal.     Left Ear: Tympanic membrane, ear canal and external ear normal.     Nose: Congestion present.     Mouth/Throat:     Mouth: Mucous membranes are moist.     Pharynx: Oropharynx is clear. No oropharyngeal exudate or posterior oropharyngeal erythema.  Eyes:     Extraocular Movements: Extraocular movements intact.  Cardiovascular:     Rate and Rhythm: Normal rate and regular rhythm.     Pulses: Normal pulses.     Heart sounds: Normal heart sounds.  Pulmonary:     Effort: Pulmonary effort is normal.     Breath sounds: Normal breath sounds.   Neurological:     Mental Status: She is alert and oriented to person, place, and time. Mental status is at baseline.      UC Treatments / Results  Labs (all labs ordered are listed, but only abnormal results are displayed) Labs Reviewed - No data to display  EKG   Radiology No results found.  Procedures Procedures (including critical care time)  Medications Ordered in UC Medications - No data to display  Initial Impression / Assessment and Plan / UC Course  I have reviewed the triage vital signs and the nursing notes.  Pertinent labs & imaging results that were available during my care of the patient were reviewed by me and considered in my medical decision making (see chart for details).  Acute viral sinusitis  Patient is in no signs of distress nor toxic appearing.  Vital signs are stable.  Low suspicion for pneumonia, pneumothorax or bronchitis and therefore will defer imaging.  Symptoms present for 4 days, etiology most likely viral, patient endorses that all symptoms typically can progress to a bacterial infection, empirically placed on  doxycycline , additionally prescribed prednisone  and Atrovent .May use additional over-the-counter medication as needed for supportive care.  May follow-up with urgent care as needed if symptoms persist or worsen.   Final Clinical Impressions(s) / UC Diagnoses   Final diagnoses:  Acute viral sinusitis   Discharge Instructions      Your symptoms today are most likely being caused by a virus and should steadily improve in time it can take up to 7 to 10 days before you truly start to see a turnaround however things will get better  Been placed on antibiotic empirically to help prevent symptoms from worsening, take doxycycline  twice daily for 7 days  Begin prednisone  every morning with food as directed to help reduce sinus pressure, may take Tylenol  during treatment  Use nasal spray twice daily to help clear out the sinuses, continue  use of Mucinex    For cough: honey 1/2 to 1 teaspoon (you can dilute the honey in water  or another fluid).  You can also use guaifenesin  and dextromethorphan for cough. You can use a humidifier for chest congestion and cough.  If you don't have a humidifier, you can sit in the bathroom with the hot shower running.      For sore throat: try warm salt water  gargles, cepacol lozenges, throat spray, warm tea or water  with lemon/honey, popsicles or ice, or OTC cold relief medicine for throat discomfort.   For congestion: take a daily anti-histamine like Zyrtec, Claritin, and a oral decongestant, such as pseudoephedrine.  You can also use Flonase  1-2 sprays in each nostril daily.   It is important to stay hydrated: drink plenty of fluids (water , gatorade/powerade/pedialyte, juices, or teas) to keep your throat moisturized and help further relieve irritation/discomfort.   ED Prescriptions     Medication Sig Dispense Auth. Provider   doxycycline  (VIBRAMYCIN ) 100 MG capsule Take 1 capsule (100 mg total) by mouth 2 (two) times daily. 14 capsule Lavina Resor R, NP   predniSONE  (STERAPRED UNI-PAK 21 TAB) 10 MG (21) TBPK tablet Take by mouth daily. Take 6 tabs by mouth daily  for 1 days, then 5 tabs for 1 days, then 4 tabs for 1 days, then 3 tabs for 1 days, 2 tabs for 1 days, then 1 tab by mouth daily for 1 days 21 tablet Leilah Polimeni R, NP   ipratropium (ATROVENT ) 0.03 % nasal spray Place 2 sprays into both nostrils every 12 (twelve) hours. 30 mL Reena Canning, NP      PDMP not reviewed this encounter.   Reena Canning, NP 05/01/24 1030

## 2024-05-01 NOTE — ED Triage Notes (Signed)
 Pt c/o facial pain and headache x4days Nasal congestion and drainage x1day  Pt has taken Mucinex  for her symptoms

## 2024-05-01 NOTE — Discharge Instructions (Addendum)
 Your symptoms today are most likely being caused by a virus and should steadily improve in time it can take up to 7 to 10 days before you truly start to see a turnaround however things will get better  Been placed on antibiotic empirically to help prevent symptoms from worsening, take doxycycline  twice daily for 7 days  Begin prednisone  every morning with food as directed to help reduce sinus pressure, may take Tylenol  during treatment  Use nasal spray twice daily to help clear out the sinuses, continue use of Mucinex    For cough: honey 1/2 to 1 teaspoon (you can dilute the honey in water  or another fluid).  You can also use guaifenesin  and dextromethorphan for cough. You can use a humidifier for chest congestion and cough.  If you don't have a humidifier, you can sit in the bathroom with the hot shower running.      For sore throat: try warm salt water  gargles, cepacol lozenges, throat spray, warm tea or water  with lemon/honey, popsicles or ice, or OTC cold relief medicine for throat discomfort.   For congestion: take a daily anti-histamine like Zyrtec, Claritin, and a oral decongestant, such as pseudoephedrine.  You can also use Flonase  1-2 sprays in each nostril daily.   It is important to stay hydrated: drink plenty of fluids (water , gatorade/powerade/pedialyte, juices, or teas) to keep your throat moisturized and help further relieve irritation/discomfort.

## 2024-05-14 ENCOUNTER — Ambulatory Visit
Admission: EM | Admit: 2024-05-14 | Discharge: 2024-05-14 | Disposition: A | Discharge status: Home / Self Care | Attending: Emergency Medicine | Admitting: Emergency Medicine

## 2024-05-14 ENCOUNTER — Ambulatory Visit
Admission: RE | Admit: 2024-05-14 | Discharge: 2024-05-14 | Disposition: A | Discharge status: Home / Self Care | Attending: Emergency Medicine | Admitting: Emergency Medicine

## 2024-05-14 ENCOUNTER — Ambulatory Visit
Admission: RE | Admit: 2024-05-14 | Discharge: 2024-05-14 | Disposition: A | Discharge status: Home / Self Care | Source: Ambulatory Visit | Attending: Emergency Medicine | Admitting: Emergency Medicine

## 2024-05-14 ENCOUNTER — Ambulatory Visit: Payer: Self-pay

## 2024-05-14 DIAGNOSIS — R058 Other specified cough: Secondary | ICD-10-CM

## 2024-05-14 DIAGNOSIS — R030 Elevated blood-pressure reading, without diagnosis of hypertension: Secondary | ICD-10-CM

## 2024-05-14 MED ORDER — BENZONATATE 100 MG PO CAPS: 100.0000 mg | ORAL_CAPSULE | Freq: Three times a day (TID) | ORAL | 0 refills | Status: DC | PRN

## 2024-05-14 NOTE — Discharge Instructions (Addendum)
 Go to [facility] for your x-ray.    [address] [facility] [address]  [address] [place], [place] [address] [phone]  I will call you with the results.  Take the Tessalon  Perles as directed for cough.    Follow up with your primary care provider tomorrow.  Go to the emergency department if you have worsening symptoms.    Your blood pressure is elevated today at 167/81.  Please have this rechecked by your primary care provider in 2-4 weeks.

## 2024-05-14 NOTE — ED Triage Notes (Signed)
 Pt c/o worsening cough & congestion since 6/6. Was seen here for the same issue. Given doxy & pred w/o relief.

## 2024-05-14 NOTE — Telephone Encounter (Signed)
 FYI Only or Action Required?: FYI only for provider.  Patient was last seen in primary care on 12/05/2023 by Holly Homestead, MD. Called Nurse Triage reporting Cough with green secretions. Symptoms began several weeks ago. Interventions attempted: Prescription medications: Abx, medicated nasal spray. Symptoms are: gradually worsening.  Triage Disposition: See Physician Within 24 Hours  Patient/caregiver understands and will follow disposition?: yes no appts called [facility] to double check      Copied from CRM [id number]. Topic: Clinical - Red Word Triage >> May 14, 2024 11:21 AM Alethia Huxley E wrote: [facility] that prompted transfer to Nurse Triage: Cough / discolored phlegm. Patient is having a worsening cough producing green phlegm. Symptoms going on since 6/6. Reason for Disposition  [1] Continuous (nonstop) coughing interferes with work or school AND [2] no improvement using cough treatment per Care Advice  Answer Assessment - Initial Assessment Questions 1. ONSET: When did the cough begin?      05/01/24 2. SEVERITY: How bad is the cough today?      Frequent cough 3. SPUTUM: Describe the color of your sputum (none, dry cough; clear, white, yellow, green)     Green  5. DIFFICULTY BREATHING: Are you having difficulty breathing? If Yes, ask: How bad is it? (e.g., mild, moderate, severe)    - MILD: No SOB at rest, mild SOB with walking, speaks normally in sentences, can lie down, no retractions, pulse < 100.    - MODERATE: SOB at rest, SOB with minimal exertion and prefers to sit, cannot lie down flat, speaks in phrases, mild retractions, audible wheezing, pulse 100-120.    - SEVERE: Very SOB at rest, speaks in single words, struggling to breathe, sitting hunched forward, retractions, pulse > 120      No occ wheeze 6. FEVER: Do you have a fever? If Yes, ask: What is your temperature, how was it measured, and when did it start?     no 8. LUNG HISTORY: Do you have any history of  lung disease?  (e.g., pulmonary embolus, asthma, emphysema)     no 10. OTHER SYMPTOMS: Do you have any other symptoms? (e.g., runny nose, wheezing, chest pain)       Occ. Wheezing, right ear stopped up,  nasal drainage  Protocols used: Cough - Acute Productive-A-AH .

## 2024-05-14 NOTE — ED Provider Notes (Signed)
 Holly Obrien    CSN: [id number] Arrival date & time: 05/14/24  1317      History   Chief Complaint Chief Complaint  Patient presents with   Cough    HPI Holly Obrien is a 73 y.o. female.  Patient presents with ongoing postnasal drip, congestion, cough.  She states her mucus is green.  She recently completed a course of doxycycline  and prednisone .  She has not needed to use her albuterol  inhaler in the last couple of days.  She denies fever, chills, wheezing, shortness of breath.  She took Mucinex  today and has been using her ipratropium nasal spray that was prescribed at her last visit.  Patient was seen at this urgent care on 05/01/2024; diagnosed with acute viral sinusitis; treated with doxycycline , prednisone , ipratropium nasal spray.  Her medical history includes asthma.  The history is provided by the patient and medical records.    Past Medical History:  Diagnosis Date   Arthritis    Asthma    with URI's   Cataract    Chronic kidney disease    kidney mass   Complication of anesthesia    One time, I had a hard time waking up   Esophagitis    HH/ERD   GERD (gastroesophageal reflux disease)    Glaucoma    Hyperlipidemia    NMR 2007   Hypothyroidism    Thyroid  disease    hypothyroidism    Patient Active Problem List   Diagnosis Date Noted   Radial styloid tenosynovitis of right hand 03/05/2024   Right wrist pain 03/05/2024   Pain in finger of right hand 01/14/2023   Inflammatory pain 12/14/2022   Adjustment disorder 10/16/2022   Tailor's bunion of left foot 04/04/2022   Pre-diabetes 10/09/2021   Pain of left hip joint 06/19/2021   Right leg pain 06/01/2021   Acquired trigger finger of right ring finger 03/29/2021   Encounter for orthopedic follow-up care 03/29/2021   Left renal mass 02/21/2017   Dizziness 01/24/2017   Kidney lesion, native, right 11/29/2016   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   History of cerebellar stroke 11/29/2016    Coronary arteriosclerosis in native artery 11/29/2016   Fatty liver disease, 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 1997 Right ovary removed--Dr Roslyn Coombe   PUBOVAGINAL [facility]  1997   Dr Grady Lawman   ROBOTIC ASSITED PARTIAL NEPHRECTOMY Right 02/20/2017   Procedure: XI ROBOTIC ASSITED PARTIAL NEPHRECTOMY;  Surgeon: Osborn Blaze, MD;  Location: [facility];  Service: Urology;  Laterality: Right;   TRIGGER FINGER RELEASE Right 01/2014   TRIGGER FINGER RELEASE Left 09/2014    OB History     Gravida  2   Para  2   Term  2   Preterm  0   AB  0   Living  2      SAB  0   IAB  0  Ectopic  0   Multiple  0   Live Births  2            Home Medications    Prior to Admission medications   Medication Sig Start Date End Date Taking? Authorizing Provider  benzonatate  (TESSALON ) 100 MG capsule Take 1 capsule (100 mg total) by mouth 3 (three) times daily as needed for cough. 05/14/24  Yes Wellington Half, NP  albuterol  (VENTOLIN  HFA) 108 (90 Base) MCG/ACT inhaler USE 1-2 PUFFS EVERY 4 HRS AS NEEDED 01/08/23   Adelia Homestead, MD  aspirin EC 81 MG tablet Take 81 mg by mouth daily.    [provider]  B Complex-C (SUPER B COMPLEX PO) Take 1,000 mcg by mouth.    [provider]  busPIRone  (BUSPAR ) 5 MG tablet TAKE 1 TABLET BY MOUTH TWICE A DAY 12/10/22   Adelia Homestead, MD  Calcium Carbonate (CALCIUM 600  PO) Take by mouth. + mag/zink/d3    [provider]  Cholecalciferol (VITAMIN D  PO) Take 5,000 Int'l Units by mouth.    [provider]  Coenzyme Q10 (CO Q 10 PO) Take 300 mg by mouth daily.    [provider]  doxycycline  (VIBRAMYCIN ) 100 MG capsule Take 1 capsule (100 mg total) by mouth 2 (two) times daily. 05/01/24   Obrien, Holly Spatz, NP  Echinacea-Goldenseal (ECHINACEA COMB/GOLDEN SEAL PO) Take 900 mg by mouth.    [provider]  esomeprazole  (NEXIUM ) 20 MG packet Take 20 mg by mouth daily before breakfast.    [provider]  fluticasone  (FLONASE ) 50 MCG/ACT nasal spray SPRAY 1 SPRAY IN EACH NOSTRIL TWICE A DAY AS NEEDED. USE THE CROSSOVER TECHNIQUE AS DISCUSSED 11/06/21   Adelia Homestead, MD  Fluticasone -Salmeterol (ADVAIR HFA IN) Inhale into the lungs. 108 prn    [provider]  ipratropium (ATROVENT ) 0.03 % nasal spray Place 2 sprays into both nostrils every 12 (twelve) hours. 05/01/24   Obrien, Adrienne R, NP  Krill Oil (OMEGA-3) 500 MG CAPS  03/10/18   [provider]  latanoprost  (XALATAN ) 0.005 % ophthalmic solution Place 1 drop into both eyes at bedtime.  07/11/15   [provider]  levothyroxine  (SYNTHROID ) 75 MCG tablet TAKE 1 TABLET BY MOUTH EVERY DAY BEFORE BREAKFAST 12/09/23   Adelia Homestead, MD  MAGNESIUM  PO Take 250 mg by mouth.    [provider]  POTASSIUM PO Take 550 mg by mouth.    [provider]  predniSONE  (STERAPRED UNI-PAK 21 TAB) 10 MG (21) TBPK tablet Take by mouth daily. Take 6 tabs by mouth daily  for 1 days, then 5 tabs for 1 days, then 4 tabs for 1 days, then 3 tabs for 1 days, 2 tabs for 1 days, then 1 tab by mouth daily for 1 days 05/01/24   Reena Canning, NP  simvastatin  (ZOCOR ) 20 MG tablet TAKE 1 TABLET BY MOUTH DAILY AT 6 PM. 12/09/23   Adelia Homestead, MD  timolol  (BETIMOL ) 0.5 % ophthalmic solution Place 1 drop into both eyes daily.    [provider]  UNABLE TO FIND otc sleep aid melatonin    [provider]  Zinc 50 MG TABS Take by mouth.    [provider]    Family History Family History  Problem Relation Age of Onset   Arthritis Other    Depression Other    Thyroid  disease Other        hyperthyroidism  Stroke Other    Heart disease Other        CABG   Diabetes Other        hypoglycemia   Osteoarthritis Mother    Hyperlipidemia Mother    Hypertension Mother    Thyroid  disease Mother    Heart failure Father    Cancer Father        mesothelioma   Heart failure Brother    Hypertension Brother    Hypertension Sister     Social History Social History   Tobacco Use   Smoking status: Never   Smokeless tobacco: Never  Vaping Use   Vaping status: Never Used  Substance Use Topics   Alcohol use: No   Drug use: No     Allergies   Ciloxan [ciprofloxacin hcl], Ciprofloxacin, Erythromycin, Penicillins, Cefuroxime axetil, and Sulfonamide derivatives   Review of Systems Review of Systems  Constitutional:  Negative for chills and fever.  HENT:  Positive for congestion and postnasal drip. Negative for ear pain and sore throat.   Respiratory:  Positive for cough. Negative for shortness of breath and wheezing.      Physical Exam Triage Vital Signs ED Triage Vitals  Encounter Vitals Group     BP 05/14/24 1325 (!) 167/81     Girls Systolic BP Percentile --      Girls Diastolic BP Percentile --      Boys Systolic BP Percentile --      Boys Diastolic BP Percentile --      Pulse Rate 05/14/24 1325 81     Resp 05/14/24 1325 16     Temp 05/14/24 1325 98.3 F (36.8 C)     Temp Source 05/14/24 1325 Oral     SpO2 05/14/24 1325 97 %     Weight --      Height --      Head Circumference --      Peak Flow --      Pain Score 05/14/24 1327 0     Pain Loc --      Pain Education --      Exclude from Growth Chart --    No data found.  Updated Vital Signs BP (!) 167/81 (BP Location:  Left Arm)   Pulse 81   Temp 98.3 F (36.8 C) (Oral)   Resp 16   LMP 05/01/1987   SpO2 97%   Visual Acuity Right Eye Distance:   Left Eye Distance:   Bilateral Distance:    Right Eye Near:   Left Eye Near:    Bilateral Near:     Physical Exam Constitutional:      General: She is not in acute distress. HENT:     Right Ear: Tympanic membrane normal.     Left Ear: Tympanic membrane normal.     Nose: Nose normal.     Mouth/Throat:     Mouth: Mucous membranes are moist.     Pharynx: Oropharynx is clear.     Comments: PND  Cardiovascular:     Rate and Rhythm: Normal rate and regular rhythm.     Heart sounds: Normal heart sounds.  Pulmonary:     Effort: Pulmonary effort is normal. No respiratory distress.     Breath sounds: Normal breath sounds. No wheezing.   Neurological:     Mental Status: She is alert.      UC Treatments / Results  Labs (all labs ordered are listed, but only abnormal results are displayed) Labs Reviewed - No  data to display  EKG   Radiology DG Chest 2 View Result Date: 05/14/2024 CLINICAL DATA:  Productive cough and sinus infection for 2 weeks. EXAM: CHEST - 2 VIEW COMPARISON:  September 09, 2020 FINDINGS: The heart size and mediastinal contours are within normal limits. Both lungs are clear. The visualized skeletal structures are unremarkable. IMPRESSION: No active cardiopulmonary disease. Electronically Signed   By: Virgle Grime M.D.   On: 05/14/2024 15:40    Procedures Procedures (including critical care time)  Medications Ordered in UC Medications - No data to display  Initial Impression / Assessment and Plan / UC Course  I have reviewed the triage vital signs and the nursing notes.  Pertinent labs & imaging results that were available during my care of the patient were reviewed by me and considered in my medical decision making (see chart for details).    Productive cough, elevated blood pressure reading.  Lungs are clear at this  time and O2 sat is 97% on room air.  Patient states she has not needed to use her albuterol  inhaler recently.  She denies wheezing or shortness of breath.  CXR negative.  Treating cough with Tessalon  Perles.  Instructed her to follow-up with her PCP tomorrow.  ED precautions given.  Also discussed with patient that her blood pressure is elevated today and needs to be rechecked by her PCP.  Education provided on cough and hypertension.  Patient agrees to plan of care.  Final Clinical Impressions(s) / UC Diagnoses   Final diagnoses:  Productive cough  Elevated blood pressure reading     Discharge Instructions      Go to [facility] for your x-ray.    [address] [facility] [address]  [address] [place], [place] [address] [phone]  I will call you with the results.  Take the Tessalon  Perles as directed for cough.    Follow up with your primary care provider tomorrow.  Go to the emergency department if you have worsening symptoms.    Your blood pressure is elevated today at 167/81.  Please have this rechecked by your primary care provider in 2-4 weeks.          ED Prescriptions     Medication Sig Dispense Auth. Provider   benzonatate  (TESSALON ) 100 MG capsule Take 1 capsule (100 mg total) by mouth 3 (three) times daily as needed for cough. 21 capsule Wellington Half, NP      PDMP not reviewed this encounter.   Wellington Half, NP 05/14/24 [phone]

## 2024-05-15 ENCOUNTER — Ambulatory Visit (HOSPITAL_COMMUNITY): Payer: Self-pay

## 2024-06-01 ENCOUNTER — Other Ambulatory Visit: Payer: Self-pay | Admitting: Internal Medicine

## 2024-06-01 DIAGNOSIS — Z1231 Encounter for screening mammogram for malignant neoplasm of breast: Secondary | ICD-10-CM

## 2024-07-20 ENCOUNTER — Ambulatory Visit
Admission: RE | Admit: 2024-07-20 | Discharge: 2024-07-20 | Disposition: A | Discharge status: Home / Self Care | Source: Ambulatory Visit | Attending: Internal Medicine | Admitting: Internal Medicine

## 2024-07-20 DIAGNOSIS — Z1231 Encounter for screening mammogram for malignant neoplasm of breast: Secondary | ICD-10-CM

## 2024-07-24 ENCOUNTER — Ambulatory Visit: Payer: Self-pay | Admitting: Internal Medicine

## 2024-07-29 LAB — HM MAMMOGRAPHY

## 2024-08-05 ENCOUNTER — Telehealth: Payer: Self-pay

## 2024-08-05 NOTE — Telephone Encounter (Signed)
 Copied from CRM [id number]. Topic: Clinical - Medication Question >> Aug 05, 2024  2:54 PM Carlyon D wrote: Reason for CRM: Pt is asking for a script for covid vaccine for [employer]  Preferred pharmacy: [id number] [address], [place] - [address] [place] [address] Phone: [phone] Fax: [phone].

## 2024-08-06 MED ORDER — COVID-19 MRNA VAC-TRIS(PFIZER) 30 MCG/0.3ML IM SUSY: 0.3000 mL | PREFILLED_SYRINGE | Freq: Once | INTRAMUSCULAR | 0 refills | Status: AC

## 2024-08-06 NOTE — Telephone Encounter (Signed)
 Called and lvm for patient

## 2024-08-06 NOTE — Telephone Encounter (Signed)
 Sent in

## 2024-08-06 NOTE — Addendum Note (Signed)
 Addended by: ROLLENE NORRIS A on: 08/06/2024 10:33 AM   Modules accepted: Orders

## 2024-11-29 ENCOUNTER — Other Ambulatory Visit: Payer: Self-pay | Admitting: Internal Medicine

## 2024-11-29 DIAGNOSIS — E038 Other specified hypothyroidism: Secondary | ICD-10-CM

## 2024-12-15 ENCOUNTER — Encounter: Admitting: Internal Medicine

## 2024-12-18 ENCOUNTER — Ambulatory Visit: Admitting: Internal Medicine

## 2024-12-18 ENCOUNTER — Encounter: Payer: Self-pay | Admitting: Internal Medicine

## 2024-12-18 VITALS — BP 130/70 | HR 73 | Temp 98.3°F | Ht 63.0 in | Wt 216.0 lb

## 2024-12-18 DIAGNOSIS — Z8673 Personal history of transient ischemic attack (TIA), and cerebral infarction without residual deficits: Secondary | ICD-10-CM | POA: Diagnosis not present

## 2024-12-18 DIAGNOSIS — Z0000 Encounter for general adult medical examination without abnormal findings: Secondary | ICD-10-CM | POA: Diagnosis not present

## 2024-12-18 DIAGNOSIS — K219 Gastro-esophageal reflux disease without esophagitis: Secondary | ICD-10-CM

## 2024-12-18 DIAGNOSIS — R7303 Prediabetes: Secondary | ICD-10-CM | POA: Diagnosis not present

## 2024-12-18 DIAGNOSIS — E038 Other specified hypothyroidism: Secondary | ICD-10-CM

## 2024-12-18 DIAGNOSIS — E782 Mixed hyperlipidemia: Secondary | ICD-10-CM

## 2024-12-18 DIAGNOSIS — K760 Fatty (change of) liver, not elsewhere classified: Secondary | ICD-10-CM | POA: Diagnosis not present

## 2024-12-18 DIAGNOSIS — J4530 Mild persistent asthma, uncomplicated: Secondary | ICD-10-CM | POA: Diagnosis not present

## 2024-12-18 LAB — LIPID PANEL
Cholesterol: 128 mg/dL (ref 28–200)
HDL: 40.4 mg/dL
LDL Cholesterol: 66 mg/dL (ref 10–99)
NonHDL: 87.95
Total CHOL/HDL Ratio: 3
Triglycerides: 112 mg/dL (ref 10.0–149.0)
VLDL: 22.4 mg/dL (ref 0.0–40.0)

## 2024-12-18 LAB — CBC
HCT: 42 % (ref 36.0–46.0)
Hemoglobin: 14 g/dL (ref 12.0–15.0)
MCHC: 33.4 g/dL (ref 30.0–36.0)
MCV: 89 fl (ref 78.0–100.0)
Platelets: 298 K/uL (ref 150.0–400.0)
RBC: 4.72 Mil/uL (ref 3.87–5.11)
RDW: 13.9 % (ref 11.5–15.5)
WBC: 6.8 K/uL (ref 4.0–10.5)

## 2024-12-18 LAB — COMPREHENSIVE METABOLIC PANEL WITH GFR
ALT: 32 U/L (ref 3–35)
AST: 29 U/L (ref 5–37)
Albumin: 4.3 g/dL (ref 3.5–5.2)
Alkaline Phosphatase: 127 U/L — ABNORMAL HIGH (ref 39–117)
BUN: 20 mg/dL (ref 6–23)
CO2: 30 meq/L (ref 19–32)
Calcium: 9.8 mg/dL (ref 8.4–10.5)
Chloride: 103 meq/L (ref 96–112)
Creatinine, Ser: 0.8 mg/dL (ref 0.40–1.20)
GFR: 73.5 mL/min
Glucose, Bld: 95 mg/dL (ref 70–99)
Potassium: 4.2 meq/L (ref 3.5–5.1)
Sodium: 140 meq/L (ref 135–145)
Total Bilirubin: 0.5 mg/dL (ref 0.2–1.2)
Total Protein: 8 g/dL (ref 6.0–8.3)

## 2024-12-18 LAB — HEMOGLOBIN A1C: Hgb A1c MFr Bld: 6 % (ref 4.6–6.5)

## 2024-12-18 LAB — TSH: TSH: 4.06 m[IU]/L (ref 0.40–4.50)

## 2024-12-18 MED ORDER — SIMVASTATIN 20 MG PO TABS: 20.0000 mg | ORAL_TABLET | Freq: Every day | ORAL | 3 refills | Status: AC

## 2024-12-18 MED ORDER — FLUTICASONE-SALMETEROL 115-21 MCG/ACT IN AERO: 2.0000 | INHALATION_SPRAY | Freq: Two times a day (BID) | RESPIRATORY_TRACT | 2 refills | Status: AC

## 2024-12-18 NOTE — Assessment & Plan Note (Signed)
 Checking lipid panel and HGA1c and CMP and adjust as needed. BP at goal on statin and aspirin 81 mg daily.

## 2024-12-18 NOTE — Assessment & Plan Note (Signed)
 Checking HgA1c and adjust as needed.

## 2024-12-18 NOTE — Assessment & Plan Note (Signed)
Checking TSH and adjust levothyroxine as needed.

## 2024-12-18 NOTE — Assessment & Plan Note (Signed)
Checking lipid panel and adjust simvastatin as needed.  

## 2024-12-18 NOTE — Assessment & Plan Note (Signed)
 Checking CMP for stability.

## 2024-12-18 NOTE — Assessment & Plan Note (Signed)
 Flu shot up to date. Pneumonia complete. Shingrix  complete. Tetanus upto date. Cologuard due March 2026. Mammogram up to date, pap smear aged out and dexa complete. Counseled about sun safety and mole surveillance. Counseled about the dangers of distracted driving. Given 10 year screening recommendations.

## 2024-12-18 NOTE — Addendum Note (Signed)
 Addended by: ROLLENE NORRIS A on: 12/18/2024 11:30 AM   Modules accepted: Orders

## 2024-12-18 NOTE — Progress Notes (Signed)
" ° °  Subjective:   Patient ID: Holly Obrien, female    DOB: 25-Dec-1950, 74 y.o.   MRN: [id number]  The patient is here for physical. Pertinent topics discussed: Discussed the use of AI scribe software for clinical note transcription with the patient, who gave verbal consent to proceed.  History of Present Illness Holly Obrien is a 74 year old female who presents with worsening vision.  She has been experiencing worsening vision, particularly in low light conditions, which makes it difficult for her to see and read. Her cataract consultation was rescheduled to March due to clinic scheduling changes, and she is concerned about potential changes in her vision over the next two months.  Her mother, who is 28 years old, recently fell and required stitches. She visits her daily to assist, as her mother has dementia and struggles with independence, often refusing to use the call button for help. She feels guilty about not informing her mother of her brother's recent passing, fearing it would cause distress.  PMH, [facility], social history reviewed and updated  Review of Systems  Constitutional: Negative.   HENT: Negative.    Eyes: Negative.   Respiratory:  Negative for cough, chest tightness and shortness of breath.   Cardiovascular:  Negative for chest pain, palpitations and leg swelling.  Gastrointestinal:  Negative for abdominal distention, abdominal pain, constipation, diarrhea, nausea and vomiting.  Musculoskeletal: Negative.   Skin: Negative.   Neurological: Negative.   Psychiatric/Behavioral: Negative.      Objective:  Physical Exam Constitutional:      Appearance: She is well-developed.  HENT:     Head: Normocephalic and atraumatic.  Cardiovascular:     Rate and Rhythm: Normal rate and regular rhythm.  Pulmonary:     Effort: Pulmonary effort is normal. No respiratory distress.     Breath sounds: Normal breath sounds. No wheezing or rales.  Abdominal:     General: Bowel  sounds are normal. There is no distension.     Palpations: Abdomen is soft.     Tenderness: There is no abdominal tenderness.  Musculoskeletal:     Cervical back: Normal range of motion.  Skin:    General: Skin is warm and dry.  Neurological:     Mental Status: She is alert and oriented to person, place, and time.     Coordination: Coordination normal.     Vitals:   12/18/24 1010  BP: 130/70  Pulse: 73  Temp: 98.3 F (36.8 C)  TempSrc: Oral  SpO2: 97%  Weight: 216 lb (98 kg)  Height: 5' 3 (1.6 m)    Assessment & Plan:   "

## 2024-12-18 NOTE — Assessment & Plan Note (Signed)
 Taking nexium  otc and controlled. Continue.

## 2024-12-18 NOTE — Assessment & Plan Note (Signed)
 Mild persistent and she uses advair prn only (mostly for allergies or colds). Refilled and she is encouraged to use now for early symptoms. Albuterol  keep prn. No flare today.

## 2024-12-22 ENCOUNTER — Ambulatory Visit: Payer: Self-pay | Admitting: Internal Medicine

## 2024-12-25 NOTE — Telephone Encounter (Signed)
 Copied from CRM [id number]. Topic: Clinical - Lab/Test Results >> Dec 25, 2024  3:16 PM Mercedes MATSU wrote: Reason for CRM: Patient had a missed call, and it was in regards to her lab results. Labs were relayed, no call back is needed at this time.
